# Patient Record
Sex: Female | Born: 1935 | Hispanic: Refuse to answer | Marital: Single
Health system: Midwestern US, Community
[De-identification: ages and names within clinical notes are randomized; demographics above are authoritative.]

## PROBLEM LIST (undated history)

## (undated) DIAGNOSIS — I350 Nonrheumatic aortic (valve) stenosis: Secondary | ICD-10-CM

## (undated) DIAGNOSIS — K5790 Diverticulosis of intestine, part unspecified, without perforation or abscess without bleeding: Secondary | ICD-10-CM

## (undated) DIAGNOSIS — I10 Essential (primary) hypertension: Secondary | ICD-10-CM

## (undated) DIAGNOSIS — K219 Gastro-esophageal reflux disease without esophagitis: Secondary | ICD-10-CM

## (undated) DIAGNOSIS — Z860100 Personal history of colon polyps, unspecified: Secondary | ICD-10-CM

## (undated) DIAGNOSIS — D649 Anemia, unspecified: Secondary | ICD-10-CM

## (undated) DIAGNOSIS — M1990 Unspecified osteoarthritis, unspecified site: Secondary | ICD-10-CM

## (undated) DIAGNOSIS — E785 Hyperlipidemia, unspecified: Secondary | ICD-10-CM

## (undated) HISTORY — DX: Personal history of colon polyps, unspecified: Z86.0100

## (undated) HISTORY — DX: Personal history of colonic polyps: Z86.010

## (undated) HISTORY — DX: Essential (primary) hypertension: I10

## (undated) HISTORY — DX: Nonrheumatic aortic (valve) stenosis: I35.0

## (undated) HISTORY — DX: Anemia, unspecified: D64.9

## (undated) HISTORY — DX: Diverticulosis of intestine, part unspecified, without perforation or abscess without bleeding: K57.90

## (undated) HISTORY — DX: Gastro-esophageal reflux disease without esophagitis: K21.9

## (undated) HISTORY — DX: Unspecified osteoarthritis, unspecified site: M19.90

## (undated) HISTORY — DX: Hyperlipidemia, unspecified: E78.5

---

## 1995-05-07 HISTORY — PX: ABDOMINAL HYSTERECTOMY: SHX81

## 1997-05-06 HISTORY — PX: SMALL INTESTINE SURGERY: SHX150

## 1999-01-02 ENCOUNTER — Encounter: Admission: RE | Admit: 1999-01-02 | Discharge: 1999-04-02 | Payer: Self-pay | Admitting: Orthopedic Surgery

## 1999-04-02 ENCOUNTER — Encounter: Admission: RE | Admit: 1999-04-02 | Discharge: 1999-07-01 | Payer: Self-pay | Admitting: Orthopedic Surgery

## 2000-01-23 ENCOUNTER — Encounter: Admission: RE | Admit: 2000-01-23 | Discharge: 2000-04-22 | Payer: Self-pay

## 2000-06-04 ENCOUNTER — Encounter: Admission: RE | Admit: 2000-06-04 | Discharge: 2000-09-02 | Payer: Self-pay | Admitting: Internal Medicine

## 2000-09-10 ENCOUNTER — Encounter: Admission: RE | Admit: 2000-09-10 | Discharge: 2000-12-09 | Payer: Self-pay | Admitting: Internal Medicine

## 2001-01-07 ENCOUNTER — Encounter: Admission: RE | Admit: 2001-01-07 | Discharge: 2001-02-09 | Payer: Self-pay | Admitting: Internal Medicine

## 2001-05-13 ENCOUNTER — Encounter: Admission: RE | Admit: 2001-05-13 | Discharge: 2001-06-08 | Payer: Self-pay | Admitting: Internal Medicine

## 2001-08-31 ENCOUNTER — Encounter (HOSPITAL_BASED_OUTPATIENT_CLINIC_OR_DEPARTMENT_OTHER): Admission: RE | Admit: 2001-08-31 | Discharge: 2001-11-29 | Payer: Self-pay | Admitting: Internal Medicine

## 2001-12-28 ENCOUNTER — Encounter (HOSPITAL_BASED_OUTPATIENT_CLINIC_OR_DEPARTMENT_OTHER): Admission: RE | Admit: 2001-12-28 | Discharge: 2002-03-28 | Payer: Self-pay | Admitting: Internal Medicine

## 2002-03-25 ENCOUNTER — Encounter: Payer: Self-pay | Admitting: Cardiology

## 2002-03-25 ENCOUNTER — Ambulatory Visit (HOSPITAL_COMMUNITY): Admission: RE | Admit: 2002-03-25 | Discharge: 2002-03-25 | Payer: Self-pay | Admitting: Cardiology

## 2002-03-31 ENCOUNTER — Encounter (HOSPITAL_BASED_OUTPATIENT_CLINIC_OR_DEPARTMENT_OTHER): Admission: RE | Admit: 2002-03-31 | Discharge: 2002-06-30 | Payer: Self-pay | Admitting: Internal Medicine

## 2002-07-19 ENCOUNTER — Encounter (HOSPITAL_BASED_OUTPATIENT_CLINIC_OR_DEPARTMENT_OTHER): Admission: RE | Admit: 2002-07-19 | Discharge: 2002-10-17 | Payer: Self-pay | Admitting: Internal Medicine

## 2002-10-18 ENCOUNTER — Encounter (HOSPITAL_BASED_OUTPATIENT_CLINIC_OR_DEPARTMENT_OTHER): Admission: RE | Admit: 2002-10-18 | Discharge: 2003-01-16 | Payer: Self-pay | Admitting: Internal Medicine

## 2003-02-11 ENCOUNTER — Encounter (HOSPITAL_BASED_OUTPATIENT_CLINIC_OR_DEPARTMENT_OTHER): Admission: RE | Admit: 2003-02-11 | Discharge: 2003-05-12 | Payer: Self-pay | Admitting: Internal Medicine

## 2003-05-19 ENCOUNTER — Encounter (HOSPITAL_BASED_OUTPATIENT_CLINIC_OR_DEPARTMENT_OTHER): Admission: RE | Admit: 2003-05-19 | Discharge: 2003-08-17 | Payer: Self-pay | Admitting: Internal Medicine

## 2003-08-24 ENCOUNTER — Encounter (HOSPITAL_BASED_OUTPATIENT_CLINIC_OR_DEPARTMENT_OTHER): Admission: RE | Admit: 2003-08-24 | Discharge: 2003-09-02 | Payer: Self-pay | Admitting: Internal Medicine

## 2003-09-14 ENCOUNTER — Ambulatory Visit (HOSPITAL_COMMUNITY): Admission: RE | Admit: 2003-09-14 | Discharge: 2003-09-14 | Payer: Self-pay | Admitting: Gastroenterology

## 2003-09-20 ENCOUNTER — Ambulatory Visit (HOSPITAL_COMMUNITY): Admission: RE | Admit: 2003-09-20 | Discharge: 2003-09-20 | Payer: Self-pay | Admitting: Gastroenterology

## 2003-12-21 ENCOUNTER — Encounter (HOSPITAL_BASED_OUTPATIENT_CLINIC_OR_DEPARTMENT_OTHER): Admission: RE | Admit: 2003-12-21 | Discharge: 2004-03-19 | Payer: Self-pay | Admitting: Internal Medicine

## 2004-03-16 ENCOUNTER — Ambulatory Visit: Payer: Self-pay | Admitting: Oncology

## 2004-03-20 ENCOUNTER — Encounter (HOSPITAL_BASED_OUTPATIENT_CLINIC_OR_DEPARTMENT_OTHER): Admission: RE | Admit: 2004-03-20 | Discharge: 2004-04-09 | Payer: Self-pay | Admitting: Internal Medicine

## 2004-05-11 ENCOUNTER — Ambulatory Visit: Payer: Self-pay | Admitting: Oncology

## 2004-06-18 ENCOUNTER — Encounter (HOSPITAL_BASED_OUTPATIENT_CLINIC_OR_DEPARTMENT_OTHER): Admission: RE | Admit: 2004-06-18 | Discharge: 2004-07-27 | Payer: Self-pay | Admitting: Internal Medicine

## 2004-06-28 ENCOUNTER — Ambulatory Visit: Payer: Self-pay | Admitting: Oncology

## 2004-08-20 ENCOUNTER — Encounter (HOSPITAL_BASED_OUTPATIENT_CLINIC_OR_DEPARTMENT_OTHER): Admission: RE | Admit: 2004-08-20 | Discharge: 2004-11-18 | Payer: Self-pay | Admitting: Surgery

## 2004-08-23 ENCOUNTER — Ambulatory Visit: Payer: Self-pay | Admitting: Oncology

## 2004-10-18 ENCOUNTER — Ambulatory Visit: Payer: Self-pay | Admitting: Oncology

## 2004-11-19 ENCOUNTER — Encounter (HOSPITAL_BASED_OUTPATIENT_CLINIC_OR_DEPARTMENT_OTHER): Admission: RE | Admit: 2004-11-19 | Discharge: 2005-02-17 | Payer: Self-pay | Admitting: Surgery

## 2004-12-13 ENCOUNTER — Ambulatory Visit: Payer: Self-pay | Admitting: Oncology

## 2005-01-28 ENCOUNTER — Ambulatory Visit: Payer: Self-pay | Admitting: Oncology

## 2005-05-27 ENCOUNTER — Ambulatory Visit: Payer: Self-pay | Admitting: Oncology

## 2005-07-25 ENCOUNTER — Ambulatory Visit: Payer: Self-pay | Admitting: Oncology

## 2005-08-23 LAB — CBC WITH DIFFERENTIAL/PLATELET
Eosinophils Absolute: 0.2 10*3/uL (ref 0.0–0.5)
MCV: 74.3 fL — ABNORMAL LOW (ref 81.0–101.0)
MONO%: 9.6 % (ref 0.0–13.0)
NEUT#: 4.7 10*3/uL (ref 1.5–6.5)
RBC: 5.77 10*6/uL — ABNORMAL HIGH (ref 3.70–5.32)
RDW: 15.1 % — ABNORMAL HIGH (ref 11.3–14.5)
WBC: 8.4 10*3/uL (ref 3.9–10.0)
lymph#: 2.6 10*3/uL (ref 0.9–3.3)

## 2005-09-18 ENCOUNTER — Ambulatory Visit: Payer: Self-pay | Admitting: Oncology

## 2005-10-18 LAB — CBC WITH DIFFERENTIAL/PLATELET
BASO%: 0.2 % (ref 0.0–2.0)
Basophils Absolute: 0 10*3/uL (ref 0.0–0.1)
EOS%: 4.3 % (ref 0.0–7.0)
HGB: 10.7 g/dL — ABNORMAL LOW (ref 11.6–15.9)
MCH: 23.1 pg — ABNORMAL LOW (ref 26.0–34.0)
MCHC: 31 g/dL — ABNORMAL LOW (ref 32.0–36.0)
MCV: 74.4 fL — ABNORMAL LOW (ref 81.0–101.0)
MONO%: 9.7 % (ref 0.0–13.0)
RBC: 4.63 10*6/uL (ref 3.70–5.32)
RDW: 18.5 % — ABNORMAL HIGH (ref 11.3–14.5)
lymph#: 2.8 10*3/uL (ref 0.9–3.3)

## 2005-10-29 ENCOUNTER — Ambulatory Visit: Payer: Self-pay | Admitting: Oncology

## 2005-11-01 LAB — CBC WITH DIFFERENTIAL/PLATELET
EOS%: 23.7 % — ABNORMAL HIGH (ref 0.0–7.0)
Eosinophils Absolute: 2.4 10*3/uL — ABNORMAL HIGH (ref 0.0–0.5)
MCV: 77.4 fL — ABNORMAL LOW (ref 81.0–101.0)
MONO%: 7.1 % (ref 0.0–13.0)
NEUT#: 4.2 10*3/uL (ref 1.5–6.5)
RBC: 4.87 10*6/uL (ref 3.70–5.32)
RDW: 19.2 % — ABNORMAL HIGH (ref 11.3–14.5)
lymph#: 2.5 10*3/uL (ref 0.9–3.3)

## 2005-12-03 LAB — CBC WITH DIFFERENTIAL/PLATELET
BASO%: 0.6 % (ref 0.0–2.0)
Basophils Absolute: 0.1 10*3/uL (ref 0.0–0.1)
EOS%: 2.9 % (ref 0.0–7.0)
HCT: 37.7 % (ref 34.8–46.6)
HGB: 11.8 g/dL (ref 11.6–15.9)
LYMPH%: 35.1 % (ref 14.0–48.0)
MCH: 24 pg — ABNORMAL LOW (ref 26.0–34.0)
MCHC: 31.2 g/dL — ABNORMAL LOW (ref 32.0–36.0)
MCV: 76.9 fL — ABNORMAL LOW (ref 81.0–101.0)
MONO%: 9.1 % (ref 0.0–13.0)
NEUT%: 52.3 % (ref 39.6–76.8)
Platelets: 254 10*3/uL (ref 145–400)

## 2005-12-12 ENCOUNTER — Ambulatory Visit: Payer: Self-pay | Admitting: Oncology

## 2005-12-13 LAB — CBC WITH DIFFERENTIAL/PLATELET
Basophils Absolute: 0 10*3/uL (ref 0.0–0.1)
Eosinophils Absolute: 0.2 10*3/uL (ref 0.0–0.5)
HCT: 35.1 % (ref 34.8–46.6)
HGB: 11 g/dL — ABNORMAL LOW (ref 11.6–15.9)
MCV: 77 fL — ABNORMAL LOW (ref 81.0–101.0)
MONO%: 7.6 % (ref 0.0–13.0)
NEUT#: 5.1 10*3/uL (ref 1.5–6.5)
NEUT%: 59.9 % (ref 39.6–76.8)
Platelets: 256 10*3/uL (ref 145–400)
RDW: 14.9 % — ABNORMAL HIGH (ref 11.3–14.5)

## 2005-12-31 LAB — CBC WITH DIFFERENTIAL/PLATELET
Basophils Absolute: 0 10*3/uL (ref 0.0–0.1)
Eosinophils Absolute: 0.2 10*3/uL (ref 0.0–0.5)
LYMPH%: 31.9 % (ref 14.0–48.0)
MCH: 23.8 pg — ABNORMAL LOW (ref 26.0–34.0)
MCV: 76.8 fL — ABNORMAL LOW (ref 81.0–101.0)
MONO%: 9.2 % (ref 0.0–13.0)
NEUT#: 4.3 10*3/uL (ref 1.5–6.5)
Platelets: 283 10*3/uL (ref 145–400)
RBC: 5.23 10*6/uL (ref 3.70–5.32)

## 2006-01-14 LAB — CBC WITH DIFFERENTIAL/PLATELET
BASO%: 1.5 % (ref 0.0–2.0)
Basophils Absolute: 0.1 10*3/uL (ref 0.0–0.1)
EOS%: 1.8 % (ref 0.0–7.0)
HCT: 40.9 % (ref 34.8–46.6)
HGB: 12.1 g/dL (ref 11.6–15.9)
LYMPH%: 31.7 % (ref 14.0–48.0)
MCH: 22.6 pg — ABNORMAL LOW (ref 26.0–34.0)
MCHC: 29.7 g/dL — ABNORMAL LOW (ref 32.0–36.0)
MCV: 76.2 fL — ABNORMAL LOW (ref 81.0–101.0)
NEUT%: 58.8 % (ref 39.6–76.8)
Platelets: 268 10*3/uL (ref 145–400)

## 2006-01-14 LAB — COMPREHENSIVE METABOLIC PANEL
ALT: 8 U/L (ref 0–40)
AST: 12 U/L (ref 0–37)
BUN: 12 mg/dL (ref 6–23)
Calcium: 10 mg/dL (ref 8.4–10.5)
Creatinine, Ser: 0.78 mg/dL (ref 0.40–1.20)
Total Bilirubin: 0.4 mg/dL (ref 0.3–1.2)

## 2006-01-24 ENCOUNTER — Ambulatory Visit: Payer: Self-pay | Admitting: Oncology

## 2006-01-28 LAB — CBC WITH DIFFERENTIAL/PLATELET
BASO%: 1.2 % (ref 0.0–2.0)
Basophils Absolute: 0.1 10*3/uL (ref 0.0–0.1)
HCT: 35.3 % (ref 34.8–46.6)
HGB: 11 g/dL — ABNORMAL LOW (ref 11.6–15.9)
LYMPH%: 30.2 % (ref 14.0–48.0)
MCHC: 31.2 g/dL — ABNORMAL LOW (ref 32.0–36.0)
MONO#: 0.8 10*3/uL (ref 0.1–0.9)
NEUT%: 59.1 % (ref 39.6–76.8)
Platelets: 230 10*3/uL (ref 145–400)
WBC: 10.5 10*3/uL — ABNORMAL HIGH (ref 3.9–10.0)
lymph#: 3.2 10*3/uL (ref 0.9–3.3)

## 2006-03-11 ENCOUNTER — Ambulatory Visit: Payer: Self-pay | Admitting: Family Medicine

## 2006-03-18 ENCOUNTER — Encounter: Admission: RE | Admit: 2006-03-18 | Discharge: 2006-03-18 | Payer: Self-pay | Admitting: Oncology

## 2006-03-19 ENCOUNTER — Ambulatory Visit: Payer: Self-pay | Admitting: Oncology

## 2006-03-21 LAB — CBC WITH DIFFERENTIAL/PLATELET
Basophils Absolute: 0.1 10*3/uL (ref 0.0–0.1)
Eosinophils Absolute: 0.1 10*3/uL (ref 0.0–0.5)
HCT: 37.6 % (ref 34.8–46.6)
HGB: 11.2 g/dL — ABNORMAL LOW (ref 11.6–15.9)
MONO#: 0.9 10*3/uL (ref 0.1–0.9)
NEUT#: 5.8 10*3/uL (ref 1.5–6.5)
NEUT%: 60.2 % (ref 39.6–76.8)
RDW: 14.2 % (ref 11.3–14.5)
lymph#: 2.7 10*3/uL (ref 0.9–3.3)

## 2006-04-18 LAB — CBC WITH DIFFERENTIAL/PLATELET
BASO%: 1.4 % (ref 0.0–2.0)
EOS%: 1 % (ref 0.0–7.0)
HCT: 34.7 % — ABNORMAL LOW (ref 34.8–46.6)
HGB: 10.6 g/dL — ABNORMAL LOW (ref 11.6–15.9)
MCH: 23 pg — ABNORMAL LOW (ref 26.0–34.0)
MCHC: 30.4 g/dL — ABNORMAL LOW (ref 32.0–36.0)
MONO#: 0.9 10*3/uL (ref 0.1–0.9)
RDW: 13.9 % (ref 11.3–14.5)
WBC: 8.9 10*3/uL (ref 3.9–10.0)
lymph#: 2.9 10*3/uL (ref 0.9–3.3)

## 2006-05-13 ENCOUNTER — Ambulatory Visit: Payer: Self-pay | Admitting: Oncology

## 2006-05-16 LAB — CBC WITH DIFFERENTIAL/PLATELET
Basophils Absolute: 0.1 10*3/uL (ref 0.0–0.1)
Eosinophils Absolute: 0.1 10*3/uL (ref 0.0–0.5)
HGB: 12.4 g/dL (ref 11.6–15.9)
MONO#: 0.7 10*3/uL (ref 0.1–0.9)
NEUT#: 5 10*3/uL (ref 1.5–6.5)
Platelets: 260 10*3/uL (ref 145–400)
RBC: 5.42 10*6/uL — ABNORMAL HIGH (ref 3.70–5.32)
RDW: 13.9 % (ref 11.3–14.5)
WBC: 8.7 10*3/uL (ref 3.9–10.0)

## 2006-06-13 LAB — CBC WITH DIFFERENTIAL/PLATELET
Basophils Absolute: 0.1 10*3/uL (ref 0.0–0.1)
Eosinophils Absolute: 0.2 10*3/uL (ref 0.0–0.5)
HCT: 37.6 % (ref 34.8–46.6)
LYMPH%: 32.7 % (ref 14.0–48.0)
MONO#: 0.7 10*3/uL (ref 0.1–0.9)
NEUT#: 5.9 10*3/uL (ref 1.5–6.5)
NEUT%: 57.5 % (ref 39.6–76.8)
Platelets: 251 10*3/uL (ref 145–400)
WBC: 10.2 10*3/uL — ABNORMAL HIGH (ref 3.9–10.0)

## 2006-07-08 ENCOUNTER — Ambulatory Visit: Payer: Self-pay | Admitting: Oncology

## 2006-07-10 ENCOUNTER — Ambulatory Visit: Payer: Self-pay | Admitting: Family Medicine

## 2006-07-11 LAB — CBC WITH DIFFERENTIAL/PLATELET
BASO%: 0.8 % (ref 0.0–2.0)
EOS%: 1.8 % (ref 0.0–7.0)
HCT: 32.5 % — ABNORMAL LOW (ref 34.8–46.6)
LYMPH%: 29.2 % (ref 14.0–48.0)
MCH: 23.7 pg — ABNORMAL LOW (ref 26.0–34.0)
MCHC: 31.9 g/dL — ABNORMAL LOW (ref 32.0–36.0)
MCV: 74.1 fL — ABNORMAL LOW (ref 81.0–101.0)
MONO#: 0.9 10*3/uL (ref 0.1–0.9)
NEUT%: 59.1 % (ref 39.6–76.8)
Platelets: 249 10*3/uL (ref 145–400)

## 2006-08-08 LAB — CBC WITH DIFFERENTIAL/PLATELET
BASO%: 0.2 % (ref 0.0–2.0)
EOS%: 2.4 % (ref 0.0–7.0)
HCT: 35.1 % (ref 34.8–46.6)
MCH: 23.4 pg — ABNORMAL LOW (ref 26.0–34.0)
MCHC: 31.4 g/dL — ABNORMAL LOW (ref 32.0–36.0)
NEUT%: 63.5 % (ref 39.6–76.8)
lymph#: 2.7 10*3/uL (ref 0.9–3.3)

## 2006-09-02 ENCOUNTER — Ambulatory Visit: Payer: Self-pay | Admitting: Oncology

## 2006-10-03 LAB — CBC WITH DIFFERENTIAL/PLATELET
BASO%: 0.1 % (ref 0.0–2.0)
HCT: 36.3 % (ref 34.8–46.6)
LYMPH%: 35 % (ref 14.0–48.0)
MCH: 22.9 pg — ABNORMAL LOW (ref 26.0–34.0)
MCHC: 31.6 g/dL — ABNORMAL LOW (ref 32.0–36.0)
MCV: 72.6 fL — ABNORMAL LOW (ref 81.0–101.0)
MONO#: 0.6 10*3/uL (ref 0.1–0.9)
MONO%: 6.2 % (ref 0.0–13.0)
NEUT%: 56.4 % (ref 39.6–76.8)
Platelets: 256 10*3/uL (ref 145–400)
RBC: 4.99 10*6/uL (ref 3.70–5.32)
WBC: 9.4 10*3/uL (ref 3.9–10.0)

## 2006-10-28 ENCOUNTER — Ambulatory Visit: Payer: Self-pay | Admitting: Oncology

## 2006-10-31 LAB — CBC WITH DIFFERENTIAL/PLATELET
BASO%: 1.4 % (ref 0.0–2.0)
EOS%: 1.1 % (ref 0.0–7.0)
HCT: 39.7 % (ref 34.8–46.6)
LYMPH%: 25.7 % (ref 14.0–48.0)
MCH: 21.9 pg — ABNORMAL LOW (ref 26.0–34.0)
MCHC: 29.7 g/dL — ABNORMAL LOW (ref 32.0–36.0)
NEUT%: 65.3 % (ref 39.6–76.8)
RBC: 5.39 10*6/uL — ABNORMAL HIGH (ref 3.70–5.32)
lymph#: 2.5 10*3/uL (ref 0.9–3.3)

## 2006-11-10 ENCOUNTER — Ambulatory Visit: Payer: Self-pay | Admitting: Family Medicine

## 2006-11-14 ENCOUNTER — Ambulatory Visit: Payer: Self-pay | Admitting: Family Medicine

## 2006-11-19 ENCOUNTER — Encounter: Payer: Self-pay | Admitting: Family Medicine

## 2006-11-19 ENCOUNTER — Ambulatory Visit (HOSPITAL_COMMUNITY): Admission: RE | Admit: 2006-11-19 | Discharge: 2006-11-19 | Payer: Self-pay | Admitting: Family Medicine

## 2006-11-19 ENCOUNTER — Ambulatory Visit: Payer: Self-pay | Admitting: Vascular Surgery

## 2006-11-28 LAB — CBC WITH DIFFERENTIAL/PLATELET
BASO%: 0.6 % (ref 0.0–2.0)
EOS%: 4.2 % (ref 0.0–7.0)
MCH: 23.2 pg — ABNORMAL LOW (ref 26.0–34.0)
MCHC: 31.5 g/dL — ABNORMAL LOW (ref 32.0–36.0)
RDW: 17.9 % — ABNORMAL HIGH (ref 11.3–14.5)
WBC: 10.3 10*3/uL — ABNORMAL HIGH (ref 3.9–10.0)
lymph#: 2.6 10*3/uL (ref 0.9–3.3)

## 2006-12-24 ENCOUNTER — Ambulatory Visit: Payer: Self-pay | Admitting: Oncology

## 2006-12-26 LAB — CBC WITH DIFFERENTIAL/PLATELET
Basophils Absolute: 0.1 10*3/uL (ref 0.0–0.1)
EOS%: 5.2 % (ref 0.0–7.0)
Eosinophils Absolute: 0.5 10*3/uL (ref 0.0–0.5)
HGB: 12 g/dL (ref 11.6–15.9)
MONO#: 0.7 10*3/uL (ref 0.1–0.9)
NEUT#: 5.7 10*3/uL (ref 1.5–6.5)
RDW: 14.3 % (ref 11.3–14.5)
WBC: 9.5 10*3/uL (ref 3.9–10.0)
lymph#: 2.5 10*3/uL (ref 0.9–3.3)

## 2007-01-23 LAB — CBC WITH DIFFERENTIAL/PLATELET
Basophils Absolute: 0 10*3/uL (ref 0.0–0.1)
Eosinophils Absolute: 0.2 10*3/uL (ref 0.0–0.5)
HGB: 10.6 g/dL — ABNORMAL LOW (ref 11.6–15.9)
MCV: 74.3 fL — ABNORMAL LOW (ref 81.0–101.0)
MONO%: 9.3 % (ref 0.0–13.0)
NEUT#: 5.4 10*3/uL (ref 1.5–6.5)
Platelets: 243 10*3/uL (ref 145–400)
RDW: 16.5 % — ABNORMAL HIGH (ref 11.3–14.5)

## 2007-02-16 ENCOUNTER — Ambulatory Visit: Payer: Self-pay | Admitting: Family Medicine

## 2007-02-17 ENCOUNTER — Ambulatory Visit: Payer: Self-pay | Admitting: Oncology

## 2007-02-19 LAB — CBC WITH DIFFERENTIAL/PLATELET
Basophils Absolute: 0 10*3/uL (ref 0.0–0.1)
Eosinophils Absolute: 0.5 10*3/uL (ref 0.0–0.5)
LYMPH%: 21.7 % (ref 14.0–48.0)
MCV: 74.6 fL — ABNORMAL LOW (ref 81.0–101.0)
MONO%: 6.8 % (ref 0.0–13.0)
NEUT#: 5.2 10*3/uL (ref 1.5–6.5)
NEUT%: 64.4 % (ref 39.6–76.8)
Platelets: 226 10*3/uL (ref 145–400)
RBC: 4.88 10*6/uL (ref 3.70–5.32)

## 2007-03-24 ENCOUNTER — Encounter: Admission: RE | Admit: 2007-03-24 | Discharge: 2007-03-24 | Payer: Self-pay | Admitting: Family Medicine

## 2007-03-27 LAB — CBC WITH DIFFERENTIAL/PLATELET
BASO%: 0.4 % (ref 0.0–2.0)
Basophils Absolute: 0 10*3/uL (ref 0.0–0.1)
EOS%: 2.5 % (ref 0.0–7.0)
HCT: 33 % — ABNORMAL LOW (ref 34.8–46.6)
HGB: 10.1 g/dL — ABNORMAL LOW (ref 11.6–15.9)
LYMPH%: 22.9 % (ref 14.0–48.0)
MCH: 23 pg — ABNORMAL LOW (ref 26.0–34.0)
MCHC: 30.6 g/dL — ABNORMAL LOW (ref 32.0–36.0)
MONO#: 0.8 10*3/uL (ref 0.1–0.9)
NEUT%: 64.6 % (ref 39.6–76.8)
Platelets: 237 10*3/uL (ref 145–400)

## 2007-05-20 ENCOUNTER — Ambulatory Visit: Payer: Self-pay | Admitting: Oncology

## 2007-05-22 LAB — CBC WITH DIFFERENTIAL/PLATELET
BASO%: 0 % (ref 0.0–2.0)
Basophils Absolute: 0 10*3/uL (ref 0.0–0.1)
EOS%: 2.2 % (ref 0.0–7.0)
HCT: 33.5 % — ABNORMAL LOW (ref 34.8–46.6)
MCH: 23.8 pg — ABNORMAL LOW (ref 26.0–34.0)
MCHC: 31.8 g/dL — ABNORMAL LOW (ref 32.0–36.0)
MCV: 74.6 fL — ABNORMAL LOW (ref 81.0–101.0)
MONO%: 11 % (ref 0.0–13.0)
NEUT%: 66.8 % (ref 39.6–76.8)
lymph#: 2.1 10*3/uL (ref 0.9–3.3)

## 2007-07-20 ENCOUNTER — Ambulatory Visit: Payer: Self-pay | Admitting: Oncology

## 2007-07-22 LAB — CBC WITH DIFFERENTIAL/PLATELET
BASO%: 1 % (ref 0.0–2.0)
EOS%: 0.7 % (ref 0.0–7.0)
HCT: 32.8 % — ABNORMAL LOW (ref 34.8–46.6)
LYMPH%: 20.6 % (ref 14.0–48.0)
MCH: 23.4 pg — ABNORMAL LOW (ref 26.0–34.0)
MCHC: 31.7 g/dL — ABNORMAL LOW (ref 32.0–36.0)
MONO#: 0.6 10*3/uL (ref 0.1–0.9)
NEUT%: 71.5 % (ref 39.6–76.8)
RBC: 4.44 10*6/uL (ref 3.70–5.32)
WBC: 9.3 10*3/uL (ref 3.9–10.0)
lymph#: 1.9 10*3/uL (ref 0.9–3.3)

## 2007-08-26 ENCOUNTER — Ambulatory Visit: Payer: Self-pay | Admitting: Family Medicine

## 2007-09-09 ENCOUNTER — Ambulatory Visit: Payer: Self-pay | Admitting: Oncology

## 2007-09-11 LAB — CBC WITH DIFFERENTIAL/PLATELET
BASO%: 0 % (ref 0.0–2.0)
Basophils Absolute: 0 10*3/uL (ref 0.0–0.1)
EOS%: 0.8 % (ref 0.0–7.0)
HCT: 33.8 % — ABNORMAL LOW (ref 34.8–46.6)
HGB: 10.6 g/dL — ABNORMAL LOW (ref 11.6–15.9)
NEUT%: 65.7 % (ref 39.6–76.8)
Platelets: 218 10*3/uL (ref 145–400)

## 2007-09-29 ENCOUNTER — Ambulatory Visit: Payer: Self-pay | Admitting: Family Medicine

## 2007-09-29 ENCOUNTER — Encounter: Admission: RE | Admit: 2007-09-29 | Discharge: 2007-09-29 | Payer: Self-pay | Admitting: Family Medicine

## 2007-10-22 ENCOUNTER — Encounter: Admission: RE | Admit: 2007-10-22 | Discharge: 2007-11-23 | Payer: Self-pay | Admitting: Orthopaedic Surgery

## 2007-11-04 ENCOUNTER — Ambulatory Visit: Payer: Self-pay | Admitting: Oncology

## 2007-11-09 LAB — CBC WITH DIFFERENTIAL/PLATELET
BASO%: 1 % (ref 0.0–2.0)
Basophils Absolute: 0.1 10*3/uL (ref 0.0–0.1)
EOS%: 1 % (ref 0.0–7.0)
Eosinophils Absolute: 0.1 10*3/uL (ref 0.0–0.5)
HGB: 9.9 g/dL — ABNORMAL LOW (ref 11.6–15.9)
MCHC: 31.7 g/dL — ABNORMAL LOW (ref 32.0–36.0)
MCV: 74.6 fL — ABNORMAL LOW (ref 81.0–101.0)
NEUT%: 63.1 % (ref 39.6–76.8)
Platelets: 244 10*3/uL (ref 145–400)
RBC: 4.18 10*6/uL (ref 3.70–5.32)
WBC: 8.4 10*3/uL (ref 3.9–10.0)

## 2007-12-31 ENCOUNTER — Ambulatory Visit: Payer: Self-pay | Admitting: Oncology

## 2008-01-22 LAB — CBC WITH DIFFERENTIAL/PLATELET
LYMPH%: 23.2 % (ref 14.0–48.0)
MCHC: 31.2 g/dL — ABNORMAL LOW (ref 32.0–36.0)
MONO#: 0.7 10*3/uL (ref 0.1–0.9)
NEUT%: 67.6 % (ref 39.6–76.8)
RBC: 4.25 10*6/uL (ref 3.70–5.32)
RDW: 16 % — ABNORMAL HIGH (ref 11.3–14.5)

## 2008-02-23 ENCOUNTER — Ambulatory Visit: Payer: Self-pay | Admitting: Family Medicine

## 2008-03-23 ENCOUNTER — Ambulatory Visit: Payer: Self-pay | Admitting: Oncology

## 2008-03-29 ENCOUNTER — Encounter: Admission: RE | Admit: 2008-03-29 | Discharge: 2008-03-29 | Payer: Self-pay | Admitting: Oncology

## 2008-05-25 ENCOUNTER — Ambulatory Visit: Payer: Self-pay | Admitting: Oncology

## 2008-05-27 LAB — CBC WITH DIFFERENTIAL/PLATELET
BASO%: 0.4 % (ref 0.0–2.0)
EOS%: 1 % (ref 0.0–7.0)
HCT: 32.4 % — ABNORMAL LOW (ref 34.8–46.6)
HGB: 10 g/dL — ABNORMAL LOW (ref 11.6–15.9)
MCV: 78.4 fL — ABNORMAL LOW (ref 81.0–101.0)
MONO#: 0.9 10*3/uL (ref 0.1–0.9)
MONO%: 9.3 % (ref 0.0–13.0)
NEUT#: 6.6 10*3/uL — ABNORMAL HIGH (ref 1.5–6.5)
NEUT%: 65 % (ref 39.6–76.8)
RDW: 14.1 % (ref 11.3–14.5)
WBC: 10.2 10*3/uL — ABNORMAL HIGH (ref 3.9–10.0)
lymph#: 2.5 10*3/uL (ref 0.9–3.3)

## 2008-07-20 ENCOUNTER — Ambulatory Visit: Payer: Self-pay | Admitting: Oncology

## 2008-07-22 LAB — CBC WITH DIFFERENTIAL/PLATELET
EOS%: 0.9 % (ref 0.0–7.0)
Eosinophils Absolute: 0.1 10*3/uL (ref 0.0–0.5)
HCT: 34.6 % — ABNORMAL LOW (ref 34.8–46.6)
HGB: 11.1 g/dL — ABNORMAL LOW (ref 11.6–15.9)
LYMPH%: 22.9 % (ref 14.0–49.7)
MCHC: 32 g/dL (ref 31.5–36.0)
MONO#: 0.8 10*3/uL (ref 0.1–0.9)
MONO%: 7.6 % (ref 0.0–14.0)
NEUT#: 7.3 10*3/uL — ABNORMAL HIGH (ref 1.5–6.5)

## 2008-09-21 ENCOUNTER — Ambulatory Visit: Payer: Self-pay | Admitting: Oncology

## 2008-09-23 LAB — CBC WITH DIFFERENTIAL/PLATELET
EOS%: 0.5 % (ref 0.0–7.0)
HCT: 33.4 % — ABNORMAL LOW (ref 34.8–46.6)
MCHC: 31.3 g/dL — ABNORMAL LOW (ref 31.5–36.0)
MONO#: 0.8 10*3/uL (ref 0.1–0.9)
NEUT%: 63.7 % (ref 38.4–76.8)
Platelets: 219 10*3/uL (ref 145–400)
RBC: 4.37 10*6/uL (ref 3.70–5.45)
RDW: 16.1 % — ABNORMAL HIGH (ref 11.2–14.5)
WBC: 9.4 10*3/uL (ref 3.9–10.3)
lymph#: 2.6 10*3/uL (ref 0.9–3.3)

## 2008-10-06 ENCOUNTER — Ambulatory Visit: Payer: Self-pay | Admitting: Family Medicine

## 2008-10-10 ENCOUNTER — Ambulatory Visit: Payer: Self-pay | Admitting: Family Medicine

## 2008-11-23 ENCOUNTER — Ambulatory Visit: Payer: Self-pay | Admitting: Oncology

## 2008-11-25 LAB — CBC WITH DIFFERENTIAL/PLATELET
EOS%: 0.5 % (ref 0.0–7.0)
HGB: 9.8 g/dL — ABNORMAL LOW (ref 11.6–15.9)
MCV: 74.1 fL — ABNORMAL LOW (ref 79.5–101.0)
MONO%: 9.9 % (ref 0.0–14.0)
NEUT#: 6.7 10*3/uL — ABNORMAL HIGH (ref 1.5–6.5)
NEUT%: 61.1 % (ref 38.4–76.8)
Platelets: 197 10*3/uL (ref 145–400)
RBC: 4.21 10*6/uL (ref 3.70–5.45)
RDW: 15.3 % — ABNORMAL HIGH (ref 11.2–14.5)

## 2009-03-28 ENCOUNTER — Ambulatory Visit: Payer: Self-pay | Admitting: Oncology

## 2009-04-03 LAB — CBC WITH DIFFERENTIAL/PLATELET
Basophils Absolute: 0.1 10*3/uL (ref 0.0–0.1)
HCT: 31.4 % — ABNORMAL LOW (ref 34.8–46.6)
LYMPH%: 23 % (ref 14.0–49.7)
MCHC: 31.2 g/dL — ABNORMAL LOW (ref 31.5–36.0)
MCV: 79.7 fL (ref 79.5–101.0)
Platelets: 260 10*3/uL (ref 145–400)
WBC: 9.1 10*3/uL (ref 3.9–10.3)
lymph#: 2.1 10*3/uL (ref 0.9–3.3)

## 2009-04-05 ENCOUNTER — Encounter: Admission: RE | Admit: 2009-04-05 | Discharge: 2009-04-05 | Payer: Self-pay | Admitting: Family Medicine

## 2009-05-19 ENCOUNTER — Ambulatory Visit: Payer: Self-pay | Admitting: Family Medicine

## 2009-05-29 ENCOUNTER — Ambulatory Visit: Payer: Self-pay | Admitting: Oncology

## 2009-05-31 LAB — CBC WITH DIFFERENTIAL/PLATELET
BASO%: 0.4 % (ref 0.0–2.0)
Basophils Absolute: 0.1 10*3/uL (ref 0.0–0.1)
EOS%: 0.9 % (ref 0.0–7.0)
Eosinophils Absolute: 0.1 10*3/uL (ref 0.0–0.5)
HGB: 11.3 g/dL — ABNORMAL LOW (ref 11.6–15.9)
LYMPH%: 32.5 % (ref 14.0–49.7)
MCH: 23.3 pg — ABNORMAL LOW (ref 25.1–34.0)
NEUT#: 6.4 10*3/uL (ref 1.5–6.5)
NEUT%: 56.4 % (ref 38.4–76.8)
RBC: 4.86 10*6/uL (ref 3.70–5.45)
WBC: 11.3 10*3/uL — ABNORMAL HIGH (ref 3.9–10.3)

## 2009-05-31 LAB — TECHNOLOGIST REVIEW

## 2009-07-09 IMAGING — CR DG LUMBAR SPINE COMPLETE 4+V
6 series · 6 of 6 positions shown · non-contrast
Comparison: No priors

CLINICAL DATA: Low back pain

LUMBAR SPINE - COMPLETE 4+ VIEW

[t l-spine a.p. (1 of 2)]
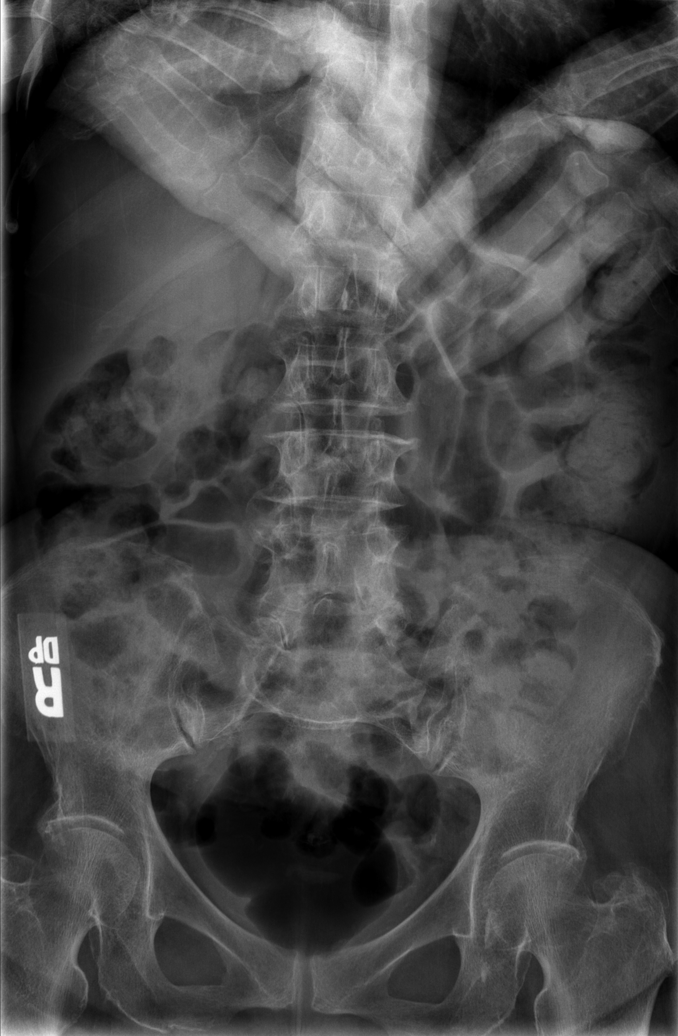

[t l-spine a.p. (2 of 2)]
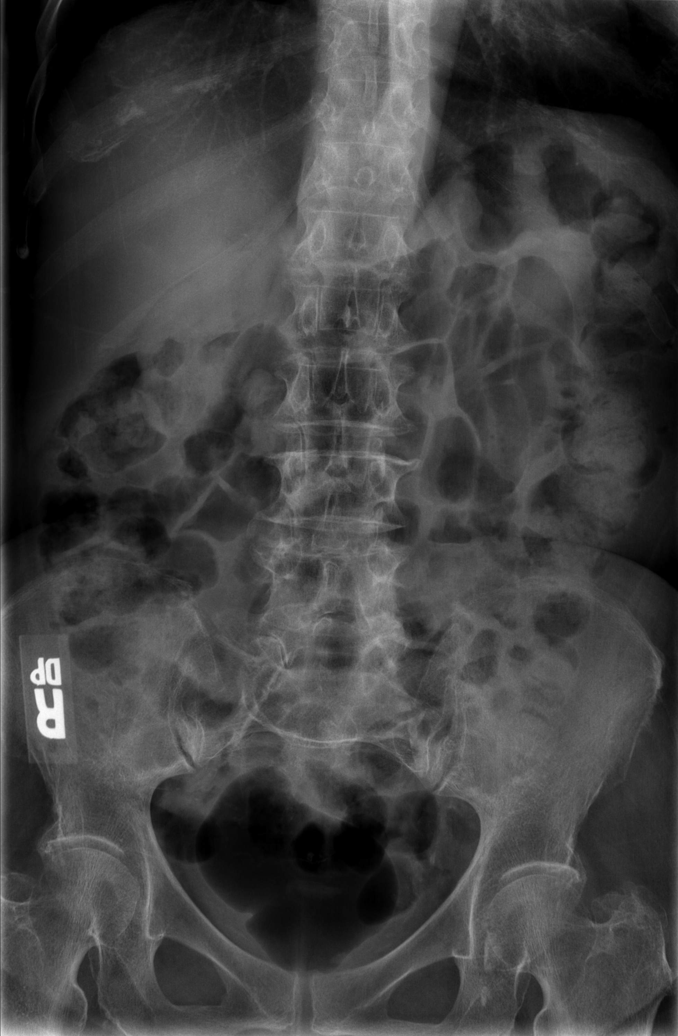

[t l-spine oblique exposure (1 of 2)]
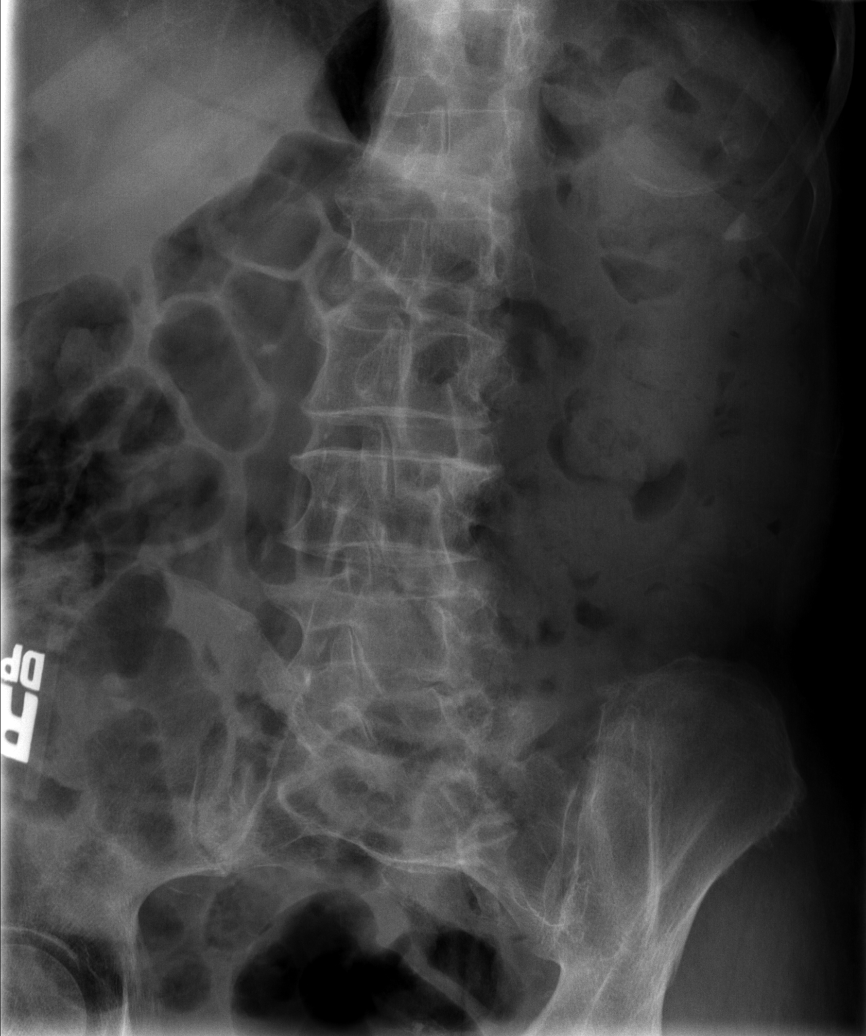

[t l-spine oblique exposure (2 of 2)]
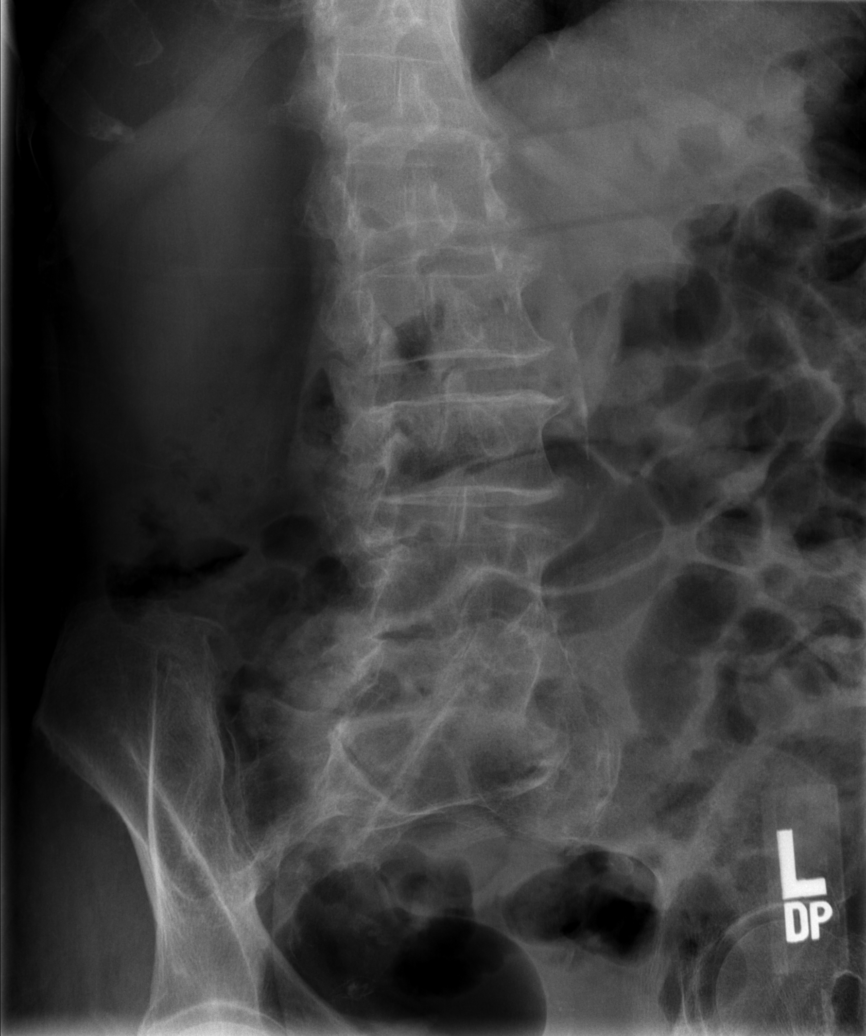

[t l-spine lat]
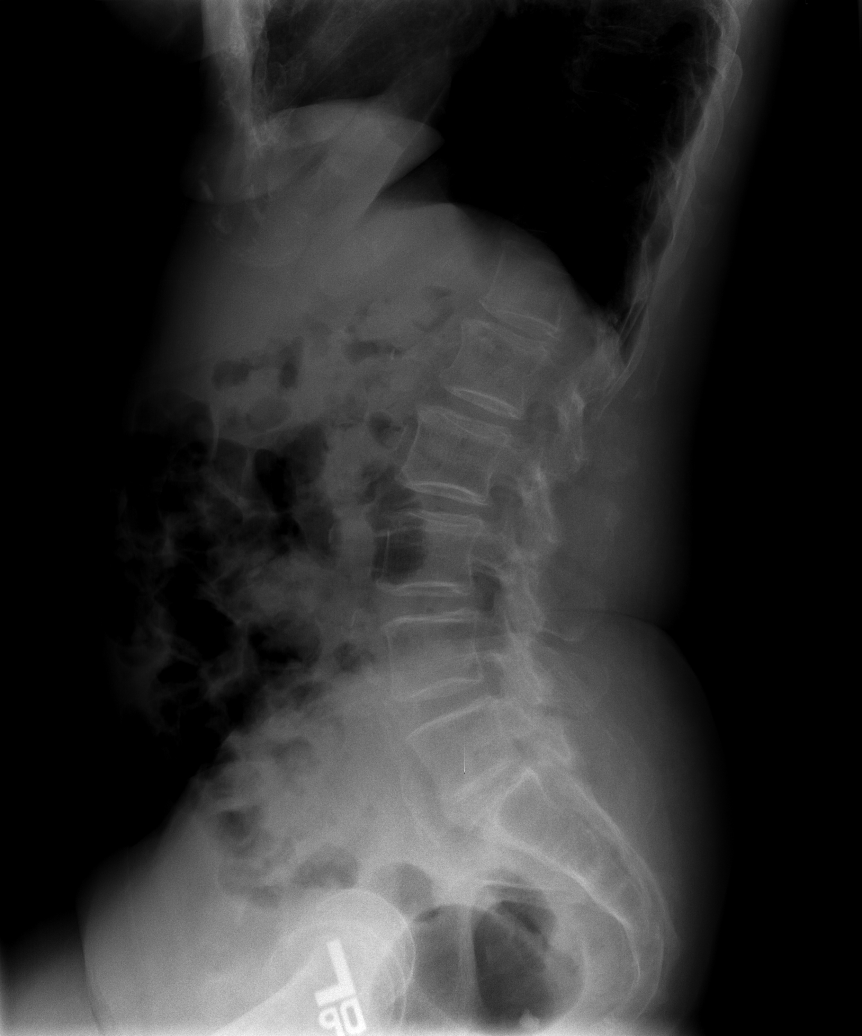

[t l-spine l5-s1 spot]
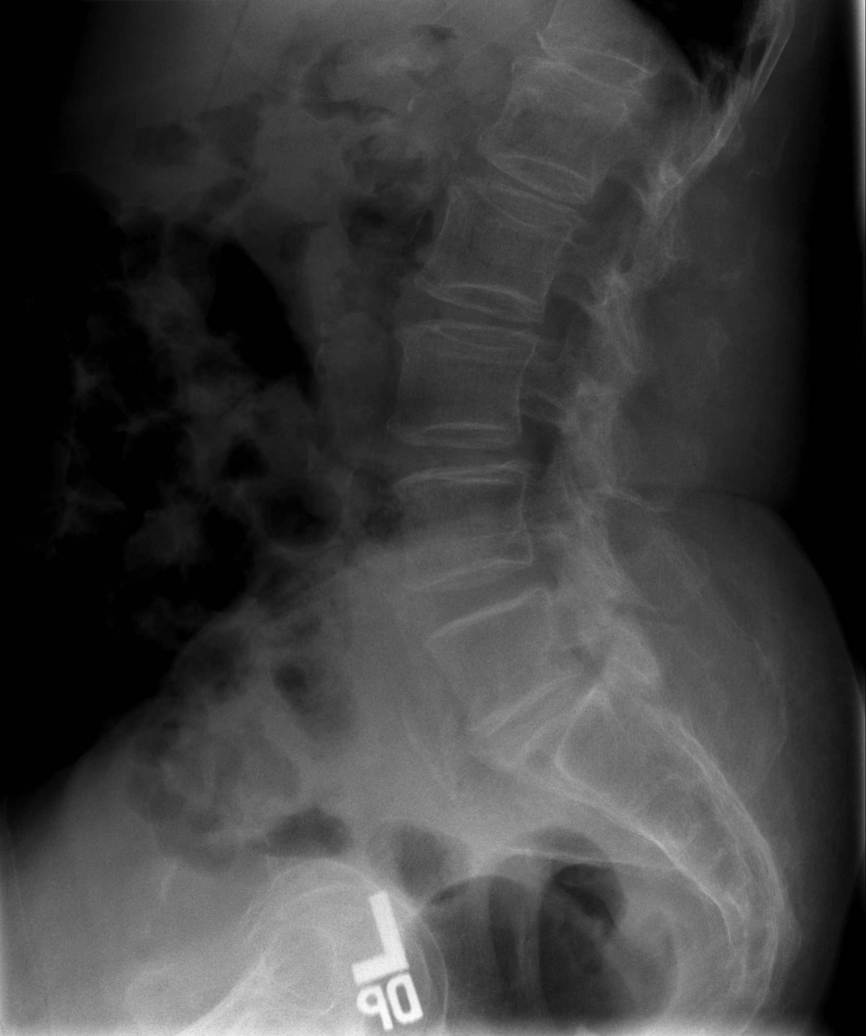

[6 of 6 positions shown; findings below may reference images not displayed]

FINDINGS: The patient either has six lumbar vertebra, or
transitional lumbosacral anatomy.

The bones are mildly demineralized consistent with aging gender.
There are no fractures or acute changes.  There is degenerative
disease disc disease at the lowest disc space.  Soft tissues
unremarkable except for aortoiliac calcification.

There are degenerative changes of the SI joints.
IMPRESSION: 1.  No acute findings.
2.  There are some changes of degenerative disc disease and
degenerative changes of the SI joints.

## 2009-07-20 ENCOUNTER — Ambulatory Visit: Payer: Self-pay | Admitting: Oncology

## 2009-07-24 LAB — CBC WITH DIFFERENTIAL/PLATELET
Basophils Absolute: 0.1 10*3/uL (ref 0.0–0.1)
HGB: 9.7 g/dL — ABNORMAL LOW (ref 11.6–15.9)
MCH: 23.5 pg — ABNORMAL LOW (ref 25.1–34.0)
MCHC: 31 g/dL — ABNORMAL LOW (ref 31.5–36.0)
MONO#: 1.1 10*3/uL — ABNORMAL HIGH (ref 0.1–0.9)
NEUT#: 7.4 10*3/uL — ABNORMAL HIGH (ref 1.5–6.5)
NEUT%: 60.4 % (ref 38.4–76.8)
RBC: 4.12 10*6/uL (ref 3.70–5.45)
WBC: 12.2 10*3/uL — ABNORMAL HIGH (ref 3.9–10.3)
lymph#: 3.6 10*3/uL — ABNORMAL HIGH (ref 0.9–3.3)
nRBC: 0 % (ref 0–0)

## 2009-09-12 ENCOUNTER — Ambulatory Visit: Payer: Self-pay | Admitting: Family Medicine

## 2009-09-18 ENCOUNTER — Ambulatory Visit: Payer: Self-pay | Admitting: Oncology

## 2009-09-18 LAB — CBC WITH DIFFERENTIAL/PLATELET
Eosinophils Absolute: 0.1 10*3/uL (ref 0.0–0.5)
LYMPH%: 22 % (ref 14.0–49.7)
MCH: 24.3 pg — ABNORMAL LOW (ref 25.1–34.0)
MCV: 76.7 fL — ABNORMAL LOW (ref 79.5–101.0)
MONO#: 1 10*3/uL — ABNORMAL HIGH (ref 0.1–0.9)
MONO%: 10.6 % (ref 0.0–14.0)
Platelets: 214 10*3/uL (ref 145–400)
WBC: 9.8 10*3/uL (ref 3.9–10.3)
lymph#: 2.2 10*3/uL (ref 0.9–3.3)

## 2009-09-27 ENCOUNTER — Ambulatory Visit: Payer: Self-pay | Admitting: Family Medicine

## 2009-10-03 ENCOUNTER — Ambulatory Visit: Payer: Self-pay | Admitting: Family Medicine

## 2009-11-13 ENCOUNTER — Ambulatory Visit: Payer: Self-pay | Admitting: Oncology

## 2009-11-13 LAB — CBC WITH DIFFERENTIAL/PLATELET
BASO%: 0.3 % (ref 0.0–2.0)
Basophils Absolute: 0 10*3/uL (ref 0.0–0.1)
Eosinophils Absolute: 0.1 10*3/uL (ref 0.0–0.5)
LYMPH%: 42 % (ref 14.0–49.7)
MCHC: 30.5 g/dL — ABNORMAL LOW (ref 31.5–36.0)
MCV: 74.9 fL — ABNORMAL LOW (ref 79.5–101.0)
MONO#: 0.6 10*3/uL (ref 0.1–0.9)
MONO%: 9.3 % (ref 0.0–14.0)
RBC: 4.15 10*6/uL (ref 3.70–5.45)
RDW: 16 % — ABNORMAL HIGH (ref 11.2–14.5)
WBC: 6.5 10*3/uL (ref 3.9–10.3)

## 2009-11-15 ENCOUNTER — Ambulatory Visit: Payer: Self-pay | Admitting: Family Medicine

## 2010-03-01 ENCOUNTER — Ambulatory Visit: Payer: Self-pay | Admitting: Oncology

## 2010-03-05 LAB — CBC WITH DIFFERENTIAL/PLATELET
BASO%: 1 % (ref 0.0–2.0)
Basophils Absolute: 0.1 10*3/uL (ref 0.0–0.1)
Eosinophils Absolute: 0.1 10*3/uL (ref 0.0–0.5)
LYMPH%: 32.6 % (ref 14.0–49.7)
MONO#: 0.6 10*3/uL (ref 0.1–0.9)
MONO%: 8.7 % (ref 0.0–14.0)
lymph#: 2.2 10*3/uL (ref 0.9–3.3)

## 2010-04-02 ENCOUNTER — Ambulatory Visit: Payer: Self-pay | Admitting: Oncology

## 2010-04-02 LAB — CBC & DIFF AND RETIC
Eosinophils Absolute: 0.1 10*3/uL (ref 0.0–0.5)
LYMPH%: 30.9 % (ref 14.0–49.7)
MCV: 77.3 fL — ABNORMAL LOW (ref 79.5–101.0)
MONO%: 9.7 % (ref 0.0–14.0)
NEUT%: 58.2 % (ref 38.4–76.8)
Platelets: 220 10*3/uL (ref 145–400)
RDW: 14 % (ref 11.2–14.5)
Retic Ct Abs: 60.61 10*3/uL (ref 18.30–72.70)
WBC: 6.8 10*3/uL (ref 3.9–10.3)

## 2010-04-10 ENCOUNTER — Ambulatory Visit: Payer: Self-pay | Admitting: Family Medicine

## 2010-04-13 ENCOUNTER — Ambulatory Visit: Payer: Self-pay | Admitting: Family Medicine

## 2010-04-26 ENCOUNTER — Encounter
Admission: RE | Admit: 2010-04-26 | Discharge: 2010-04-26 | Payer: Self-pay | Source: Home / Self Care | Attending: Family Medicine | Admitting: Family Medicine

## 2010-05-24 ENCOUNTER — Ambulatory Visit: Payer: Self-pay | Admitting: Oncology

## 2010-05-27 ENCOUNTER — Encounter: Payer: Self-pay | Admitting: Gastroenterology

## 2010-05-28 LAB — CBC WITH DIFFERENTIAL/PLATELET
BASO%: 0.7 % (ref 0.0–2.0)
EOS%: 0.5 % (ref 0.0–7.0)
Eosinophils Absolute: 0 10*3/uL (ref 0.0–0.5)
MCH: 24.4 pg — ABNORMAL LOW (ref 25.1–34.0)
MCV: 78.7 fL — ABNORMAL LOW (ref 79.5–101.0)
MONO#: 0.8 10*3/uL (ref 0.1–0.9)
NEUT#: 4 10*3/uL (ref 1.5–6.5)
NEUT%: 58.4 % (ref 38.4–76.8)
Platelets: 190 10*3/uL (ref 145–400)
RDW: 14.1 % (ref 11.2–14.5)
WBC: 6.9 10*3/uL (ref 3.9–10.3)

## 2010-07-23 ENCOUNTER — Encounter (HOSPITAL_BASED_OUTPATIENT_CLINIC_OR_DEPARTMENT_OTHER): Payer: Medicaid Other | Admitting: Oncology

## 2010-07-23 ENCOUNTER — Other Ambulatory Visit: Payer: Self-pay | Admitting: Oncology

## 2010-07-23 DIAGNOSIS — D649 Anemia, unspecified: Secondary | ICD-10-CM

## 2010-07-23 LAB — CBC WITH DIFFERENTIAL/PLATELET
Eosinophils Absolute: 0.1 10*3/uL (ref 0.0–0.5)
HCT: 32.8 % — ABNORMAL LOW (ref 34.8–46.6)
HGB: 10.2 g/dL — ABNORMAL LOW (ref 11.6–15.9)
MCH: 23.1 pg — ABNORMAL LOW (ref 25.1–34.0)
MCHC: 31.1 g/dL — ABNORMAL LOW (ref 31.5–36.0)
MONO#: 0.7 10*3/uL (ref 0.1–0.9)
NEUT#: 3.8 10*3/uL (ref 1.5–6.5)
NEUT%: 57.2 % (ref 38.4–76.8)
Platelets: 229 10*3/uL (ref 145–400)
RBC: 4.41 10*6/uL (ref 3.70–5.45)
RDW: 14.9 % — ABNORMAL HIGH (ref 11.2–14.5)
lymph#: 2.1 10*3/uL (ref 0.9–3.3)

## 2010-09-20 ENCOUNTER — Other Ambulatory Visit: Payer: Self-pay | Admitting: Oncology

## 2010-09-20 ENCOUNTER — Encounter (HOSPITAL_BASED_OUTPATIENT_CLINIC_OR_DEPARTMENT_OTHER): Payer: Medicare Other | Admitting: Oncology

## 2010-09-20 DIAGNOSIS — D649 Anemia, unspecified: Secondary | ICD-10-CM

## 2010-09-20 LAB — CBC WITH DIFFERENTIAL/PLATELET
BASO%: 0.1 % (ref 0.0–2.0)
EOS%: 0.8 % (ref 0.0–7.0)
Eosinophils Absolute: 0.1 10*3/uL (ref 0.0–0.5)
HCT: 30.4 % — ABNORMAL LOW (ref 34.8–46.6)
LYMPH%: 34.9 % (ref 14.0–49.7)
MCHC: 30.9 g/dL — ABNORMAL LOW (ref 31.5–36.0)
MONO%: 9.7 % (ref 0.0–14.0)
NEUT%: 54.5 % (ref 38.4–76.8)
lymph#: 2.2 10*3/uL (ref 0.9–3.3)

## 2010-09-21 NOTE — Op Note (Signed)
NAMEGENIECE, AKERS NO.:  [id number]   MEDICAL RECORD NO.:  [id number]                   PATIENT TYPE:  AMB   LOCATION:  ENDO                                 FACILITY:  [facility]   PHYSICIAN:  Anselmo Rod, M.D.               DATE OF BIRTH:  Sep 08, 1921   DATE OF PROCEDURE:  09/14/2003  DATE OF DISCHARGE:                                 OPERATIVE REPORT   PROCEDURE PERFORMED:  Esophagogastroduodenoscopy.   ENDOSCOPIST:  Charna Elizabeth, M.D.   INSTRUMENT USED:  Olympus video panendoscope.   INDICATIONS FOR PROCEDURE:  The patient is a 77 year old African-American  female with a history of iron deficiency anemia undergoing  esophagogastroduodenoscopy to rule out peptic ulcer disease, esophagitis,  gastritis, etc.   PREPROCEDURE PREPARATION:  Informed consent was procured from the patient.  The patient was fasted for eight hours prior to the procedure.   PREPROCEDURE PHYSICAL:  The patient had stable vital signs.  Neck supple,  chest clear to auscultation.  S1, S2 regular, but somewhat tachycardic at  108 to 130 beats per minute (patient has not taken her antihypertensive this  morning).  Abdomen soft with normal bowel sounds.   DESCRIPTION OF PROCEDURE:  The patient was placed in the left lateral  decubitus position and sedated with 40 mg of Demerol and 3 mg of Versed  intravenously.  Once the patient was adequately sedated and maintained on  low-flow oxygen and continuous cardiac monitoring, the Olympus video  panendoscope was advanced through the mouth piece over the tongue into the  esophagus under direct vision.  The entire esophagus appeared normal with no  evidence of ring, stricture, masses, esophagitis or Barrett's mucosa.  The  scope was then advanced to the stomach.  The entire gastric mucosa appeared  normal.  A small hiatal hernia was seen on high retroflexion.  The proximal  small bowel appeared normal.  There was no outlet obstruction.   The patient  tolerated the procedure well without complications.   IMPRESSION:  Normal esophagogastroduodenoscopy except for a small hiatal  hernia.   RECOMMENDATIONS:  1. Proceed with colonoscopy at this time.  2. Avoid all nonsteroidals including aspirin for now.  3. Follow antireflux measures.  4. Further recommendations to be made after colonoscopy has been done.                                               Anselmo Rod, M.D.    JNM/MEDQ  D:  09/14/2003  T:  09/15/2003  Job:  [id number]   cc:   Sharlot Gowda, M.D.  [address]  [place], [place] [address]  Fax: [phone]

## 2010-09-21 NOTE — Op Note (Signed)
Chelsea, Zuniga NO.:  [id number]   MEDICAL RECORD NO.:  [id number]                   PATIENT TYPE:  AMB   LOCATION:  ENDO                                 FACILITY:  [facility]   PHYSICIAN:  Anselmo Rod, M.D.               DATE OF BIRTH:  05/23/1921   DATE OF PROCEDURE:  09/14/2003  DATE OF DISCHARGE:                                 OPERATIVE REPORT   PROCEDURE PERFORMED:  Screening colonoscopy.   ENDOSCOPIST:  Charna Elizabeth, M.D.   INSTRUMENT USED:  Olympus video adjustable pediatric colonoscope.   INDICATIONS FOR PROCEDURE:  The patient is a 77 year old African-American  female with a history of iron deficiency anemia and a normal  esophagogastroduodenoscopy except for a small hiatal hernia.  Rule out  colonic polyps, masses, etc.   PREPROCEDURE PREPARATION:  Informed consent was procured from the patient.  The patient was fasted for eight hours prior to the procedure and prepped  with a bottle of magnesium citrate and a gallon of GoLYTELY the night prior  to the procedure.   PREPROCEDURE PHYSICAL:  The patient had stable vital signs.  Neck supple.  Chest clear to auscultation.  S1 and S2 regular.  Abdomen soft with normal  bowel sounds.   DESCRIPTION OF PROCEDURE:  The patient was placed in left lateral decubitus  position and sedated with an additional 10 mg of Demerol and 2 mg of Versed  intravenously.  Once the patient was adequately sedated and maintained on  low flow oxygen and continuous cardiac monitoring, the Olympus video  colonoscope was advanced from the rectum to the cecum with difficulty.  The  patient's position  had to be changed from the left lateral to the supine  and the right lateral position with gentle application of abdominal pressure  to reach the cecum.  The patient had a very tortuous colon.  No masses,  polyps, erosions, ulcerations or diverticula were seen.  Small internal  hemorrhoids were appreciated on  retroflexion in the rectum.  There was some  residual stool in the colon and small lesions could have been missed.  The  patient tolerated the procedure well without immediate complications.   IMPRESSION:  Very tortuous colon, small internal hemorrhoids.  Otherwise  unrevealing colonoscopy.   RECOMMENDATIONS:  Schedule small bowel follow-through to complete  evaluation.  Further recommendations will be made in follow-up.                                               Anselmo Rod, M.D.    JNM/MEDQ  D:  09/14/2003  T:  09/15/2003  Job:  [id number]   cc:   Sharlot Gowda, M.D.  [address]  [place], [place] [address]  Fax: [phone]

## 2010-10-02 ENCOUNTER — Other Ambulatory Visit: Payer: Self-pay | Admitting: Family Medicine

## 2010-11-02 ENCOUNTER — Ambulatory Visit: Payer: Medicare Other | Admitting: Medical

## 2010-11-05 ENCOUNTER — Encounter: Payer: Self-pay | Admitting: Family Medicine

## 2010-11-05 ENCOUNTER — Other Ambulatory Visit: Payer: Self-pay | Admitting: Family Medicine

## 2010-11-05 ENCOUNTER — Ambulatory Visit (INDEPENDENT_AMBULATORY_CARE_PROVIDER_SITE_OTHER): Payer: Medicare Other | Admitting: Family Medicine

## 2010-11-05 VITALS — BP 140/70 | HR 80

## 2010-11-05 DIAGNOSIS — H6120 Impacted cerumen, unspecified ear: Secondary | ICD-10-CM

## 2010-11-05 NOTE — Progress Notes (Signed)
  Subjective:    Patient ID: Chelsea Zuniga, female    DOB: 1922-03-11, 77 y.o.   MRN: [id number]  HPI she is here for consult concerning for bilateral cerumen impaction and difficulty hearing.    Review of Systems     Objective:   Physical Exam both canals are filled with cerumen. The left was lavaged without too much difficulty. Several attempts were made to lyse the right canal with not much success.       Assessment & Plan:  Bilateral cerumen impaction. Continue to use the ear wax softening and return here later this week for further lavage

## 2010-11-05 NOTE — Patient Instructions (Signed)
Continue to use the earwax softening and come back Thursday or Friday.

## 2010-11-08 ENCOUNTER — Ambulatory Visit (INDEPENDENT_AMBULATORY_CARE_PROVIDER_SITE_OTHER): Payer: Medicare Other | Admitting: Family Medicine

## 2010-11-08 ENCOUNTER — Encounter: Payer: Self-pay | Admitting: Family Medicine

## 2010-11-08 VITALS — BP 120/70 | HR 60 | Wt 106.0 lb

## 2010-11-08 DIAGNOSIS — H6120 Impacted cerumen, unspecified ear: Secondary | ICD-10-CM

## 2010-11-08 NOTE — Progress Notes (Signed)
  Subjective:    Patient ID: Chelsea Zuniga, female    DOB: 1921-07-11, 77 y.o.   MRN: [id number]  HPI She is here for recheck for treatment of impacted cerumen. They have been using drops in the ear.   Review of Systems     Objective:   Physical Exam The right canal still has material in it that was unable to be lavaged.       Assessment & Plan:  Cerumen impaction. Refer to ENT for further care.

## 2010-11-09 ENCOUNTER — Other Ambulatory Visit: Payer: Self-pay | Admitting: Family Medicine

## 2010-11-19 ENCOUNTER — Encounter (HOSPITAL_BASED_OUTPATIENT_CLINIC_OR_DEPARTMENT_OTHER): Payer: Medicare Other | Admitting: Oncology

## 2010-11-19 ENCOUNTER — Other Ambulatory Visit: Payer: Self-pay | Admitting: Oncology

## 2010-11-19 DIAGNOSIS — D649 Anemia, unspecified: Secondary | ICD-10-CM

## 2010-11-19 LAB — CBC WITH DIFFERENTIAL/PLATELET
BASO%: 1.1 % (ref 0.0–2.0)
EOS%: 0.5 % (ref 0.0–7.0)
Eosinophils Absolute: 0 10*3/uL (ref 0.0–0.5)
MCV: 77.3 fL — ABNORMAL LOW (ref 79.5–101.0)
MONO#: 0.7 10*3/uL (ref 0.1–0.9)
MONO%: 10.6 % (ref 0.0–14.0)
NEUT#: 3.7 10*3/uL (ref 1.5–6.5)
Platelets: 209 10*3/uL (ref 145–400)
WBC: 6.9 10*3/uL (ref 3.9–10.3)

## 2010-11-21 ENCOUNTER — Other Ambulatory Visit: Payer: Self-pay | Admitting: Family Medicine

## 2010-12-07 ENCOUNTER — Other Ambulatory Visit: Payer: Self-pay | Admitting: Family Medicine

## 2011-01-13 ENCOUNTER — Other Ambulatory Visit: Payer: Self-pay | Admitting: Family Medicine

## 2011-01-14 ENCOUNTER — Other Ambulatory Visit: Payer: Self-pay | Admitting: Oncology

## 2011-01-14 ENCOUNTER — Encounter (HOSPITAL_BASED_OUTPATIENT_CLINIC_OR_DEPARTMENT_OTHER): Payer: Medicare Other | Admitting: Oncology

## 2011-01-14 ENCOUNTER — Other Ambulatory Visit: Payer: Self-pay | Admitting: Family Medicine

## 2011-01-14 DIAGNOSIS — D649 Anemia, unspecified: Secondary | ICD-10-CM

## 2011-01-14 LAB — CBC & DIFF AND RETIC
Basophils Absolute: 0 10*3/uL (ref 0.0–0.1)
Eosinophils Absolute: 0.1 10*3/uL (ref 0.0–0.5)
HCT: 35.1 % (ref 34.8–46.6)
HGB: 11.1 g/dL — ABNORMAL LOW (ref 11.6–15.9)
MCV: 75 fL — ABNORMAL LOW (ref 79.5–101.0)
NEUT#: 3.4 10*3/uL (ref 1.5–6.5)
RDW: 15.1 % — ABNORMAL HIGH (ref 11.2–14.5)
Retic %: 0.97 % (ref 0.70–2.10)
Retic Ct Abs: 45.4 10*3/uL (ref 33.70–90.70)
lymph#: 2.4 10*3/uL (ref 0.9–3.3)

## 2011-03-01 ENCOUNTER — Other Ambulatory Visit: Payer: Self-pay | Admitting: Family Medicine

## 2011-03-01 NOTE — Telephone Encounter (Signed)
Is this okay to refill ? Does she need an OV soon for med check

## 2011-03-04 ENCOUNTER — Encounter: Payer: Self-pay | Admitting: Family Medicine

## 2011-03-07 ENCOUNTER — Encounter: Payer: Self-pay | Admitting: Family Medicine

## 2011-03-07 ENCOUNTER — Ambulatory Visit (INDEPENDENT_AMBULATORY_CARE_PROVIDER_SITE_OTHER): Payer: Medicare Other | Admitting: Family Medicine

## 2011-03-07 DIAGNOSIS — Z79899 Other long term (current) drug therapy: Secondary | ICD-10-CM

## 2011-03-07 DIAGNOSIS — E785 Hyperlipidemia, unspecified: Secondary | ICD-10-CM

## 2011-03-07 DIAGNOSIS — E119 Type 2 diabetes mellitus without complications: Secondary | ICD-10-CM

## 2011-03-07 DIAGNOSIS — E1169 Type 2 diabetes mellitus with other specified complication: Secondary | ICD-10-CM

## 2011-03-07 DIAGNOSIS — Z23 Encounter for immunization: Secondary | ICD-10-CM

## 2011-03-07 DIAGNOSIS — E1142 Type 2 diabetes mellitus with diabetic polyneuropathy: Secondary | ICD-10-CM

## 2011-03-07 DIAGNOSIS — D649 Anemia, unspecified: Secondary | ICD-10-CM

## 2011-03-07 DIAGNOSIS — I10 Essential (primary) hypertension: Secondary | ICD-10-CM

## 2011-03-07 DIAGNOSIS — E1159 Type 2 diabetes mellitus with other circulatory complications: Secondary | ICD-10-CM

## 2011-03-07 DIAGNOSIS — K219 Gastro-esophageal reflux disease without esophagitis: Secondary | ICD-10-CM

## 2011-03-07 DIAGNOSIS — E1140 Type 2 diabetes mellitus with diabetic neuropathy, unspecified: Secondary | ICD-10-CM | POA: Insufficient documentation

## 2011-03-07 DIAGNOSIS — E1149 Type 2 diabetes mellitus with other diabetic neurological complication: Secondary | ICD-10-CM

## 2011-03-07 LAB — CBC WITH DIFFERENTIAL/PLATELET
Basophils Absolute: 0 10*3/uL (ref 0.0–0.1)
Basophils Relative: 0 % (ref 0–1)
Eosinophils Absolute: 0 10*3/uL (ref 0.0–0.7)
Eosinophils Relative: 0 % (ref 0–5)
MCH: 23.9 pg — ABNORMAL LOW (ref 26.0–34.0)
MCHC: 30.8 g/dL (ref 30.0–36.0)
MCV: 77.6 fL — ABNORMAL LOW (ref 78.0–100.0)
Neutrophils Relative %: 60 % (ref 43–77)
Platelets: 224 10*3/uL (ref 150–400)
RBC: 4.02 MIL/uL (ref 3.87–5.11)
RDW: 14.9 % (ref 11.5–15.5)

## 2011-03-07 LAB — COMPREHENSIVE METABOLIC PANEL
Chloride: 105 mEq/L (ref 96–112)
Potassium: 4.4 mEq/L (ref 3.5–5.3)
Total Protein: 7 g/dL (ref 6.0–8.3)

## 2011-03-07 LAB — LIPID PANEL
Cholesterol: 226 mg/dL — ABNORMAL HIGH (ref 0–200)
HDL: 72 mg/dL (ref >39)
Triglycerides: 66 mg/dL (ref <150)
VLDL: 13 mg/dL (ref 0–40)

## 2011-03-07 LAB — POCT GLYCOSYLATED HEMOGLOBIN (HGB A1C): Hemoglobin A1C: 6.8

## 2011-03-07 NOTE — Progress Notes (Signed)
  Subjective:    Patient ID: Chelsea Zuniga, female    DOB: June 25, 1921, 77 y.o.   MRN: [id number]  HPI She is here for a diabetes recheck. She does intermittently check her blood sugars and they run in the low 100s. She does not smoke or drink. She has had an eye exam this year. She does occasionally check her feet. Her reflux is under good control. Her daughter who will take care of her has no particular concerns about her.   Review of Systems     Objective:   Physical Exam Alert and in no distress. Exam of her feet shows poor pulses and reflexes area the skin is normal. Sensation is normal.       Assessment & Plan:   1. Diabetes mellitus  CBC with Differential, Comprehensive metabolic panel, Lipid panel, POCT UA - Microalbumin, POCT HgB A1C  2. Hypertension associated with diabetes    3. Hyperlipidemia LDL goal <70  Lipid panel  4. Anemia  CBC with Differential  5. GERD (gastroesophageal reflux disease)    6. Encounter for long-term (current) use of other medications  CBC with Differential, Comprehensive metabolic panel, Lipid panel, POCT UA - Microalbumin   Continue with present medication regimen. Flu shot also given

## 2011-03-07 NOTE — Patient Instructions (Signed)
Continue on your present medications. 

## 2011-03-08 LAB — POCT UA - MICROALBUMIN: Albumin/Creatinine Ratio, Urine, POC: 20.1

## 2011-03-18 ENCOUNTER — Telehealth: Payer: Self-pay | Admitting: Oncology

## 2011-03-18 ENCOUNTER — Ambulatory Visit (HOSPITAL_BASED_OUTPATIENT_CLINIC_OR_DEPARTMENT_OTHER): Payer: Medicare Other | Admitting: Oncology

## 2011-03-18 ENCOUNTER — Other Ambulatory Visit (HOSPITAL_BASED_OUTPATIENT_CLINIC_OR_DEPARTMENT_OTHER): Payer: Medicare Other | Admitting: Lab

## 2011-03-18 ENCOUNTER — Other Ambulatory Visit: Payer: Self-pay | Admitting: Oncology

## 2011-03-18 VITALS — BP 175/64 | HR 82 | Temp 98.4°F | Ht <= 58 in | Wt 106.5 lb

## 2011-03-18 DIAGNOSIS — D649 Anemia, unspecified: Secondary | ICD-10-CM

## 2011-03-18 LAB — CBC & DIFF AND RETIC
BASO%: 0.4 % (ref 0.0–2.0)
EOS%: 0.3 % (ref 0.0–7.0)
HCT: 32.5 % — ABNORMAL LOW (ref 34.8–46.6)
LYMPH%: 35.4 % (ref 14.0–49.7)
MCH: 23.9 pg — ABNORMAL LOW (ref 25.1–34.0)
MCHC: 31.1 g/dL — ABNORMAL LOW (ref 31.5–36.0)
MCV: 77 fL — ABNORMAL LOW (ref 79.5–101.0)
MONO%: 8.9 % (ref 0.0–14.0)
NEUT%: 55 % (ref 38.4–76.8)
lymph#: 2.5 10*3/uL (ref 0.9–3.3)

## 2011-03-18 MED ORDER — DARBEPOETIN ALFA-POLYSORBATE 500 MCG/ML IJ SOLN
300.0000 ug | Freq: Once | INTRAMUSCULAR | Status: AC
  Administered 2011-03-18: 300 ug via SUBCUTANEOUS
  Filled 2011-03-18: qty 1

## 2011-03-18 NOTE — Telephone Encounter (Signed)
Gv pts appts for jan-nov2013 to daughter

## 2011-03-18 NOTE — Progress Notes (Signed)
ID: Chelsea Zuniga   Interval History: Terse today with her daughter for followup of her chronic anemia the interval history is generally unremarkable the daughter, Chelsea Zuniga, her today. Abdomen 82 works in childcare and doesn't get off until 3:30 so late appointments would be good for her. The patient has not alone at home however her granddaughter works out of the house and she is there most of that time  ROS: Review of systems is stable she complains of pain in her legs and difficulty walking she doesn't see very well she has some hearing loss and some sinus issues she thinks she needs new dentures occasionally her ankles 12 little bit and she wears  compression stockings for this otherwise of a sip of the skin detailed review of systems is noncontributory  Medications: I have reviewed the patient's current medications.   Current Outpatient Prescriptions  Medication Sig Dispense Refill  . ACTOPLUS MET 15-850 MG per tablet take 1 tablet by mouth twice a day  60 tablet  4  . amLODipine (NORVASC) 5 MG tablet TAKE 1 TABLET BY MOUTH ONCE DAILY  30 tablet  5  . aspirin 81 MG tablet Take 81 mg by mouth daily.       . cloNIDine (CATAPRES) 0.1 MG tablet Take 0.1 mg by mouth 2 (two) times daily.        . ferrous fumarate (HEMOCYTE - 106 MG FE) 325 (106 FE) MG TABS Take 1 tablet by mouth.        Marland Kitchen lisinopril-hydrochlorothiazide (PRINZIDE,ZESTORETIC) 20-12.5 MG per tablet Take 1 tablet by mouth daily.        . metoprolol (TOPROL-XL) 100 MG 24 hr tablet Take 100 mg by mouth daily.        . simvastatin (ZOCOR) 80 MG tablet Take 80 mg by mouth at bedtime.         Current Facility-Administered Medications  Medication Dose Route Frequency Provider Last Rate Last Dose  . darbepoetin alfa-polysorbate (ARANESP) injection 300 mcg  300 mcg Subcutaneous Once Lowella Dell, MD         Objective: Vital signs in last 24 hours: BP 175/64  Pulse 82  Temp(Src) 98.4 F (36.9 C) (Oral)  Ht 4' 9.5" (1.461 m)   Wt 106 lb 8 oz (48.308 kg)  BMI 22.65 kg/m2   Physical Exam:    Sclerae unicteric  Oropharynx clear  No peripheral adenopathy  Lungs clear -- no rales or rhonchi  Heart regular rate and rhythm  Abdomen benign  MSK no focal spinal tenderness, no peripheral edema  Neuro nonfocal    Lab Results:   BMET    Component Value Date/Time   NA 140 03/07/2011 1605   K 4.4 03/07/2011 1605   CL 105 03/07/2011 1605   CO2 26 03/07/2011 1605   GLUCOSE 162* 03/07/2011 1605   BUN 15 03/07/2011 1605   CREATININE 0.78 03/07/2011 1605   CREATININE 0.78 01/14/2006 1515   CALCIUM 9.5 03/07/2011 1605     CMP     Component Value Date/Time   NA 140 03/07/2011 1605   K 4.4 03/07/2011 1605   CL 105 03/07/2011 1605   CO2 26 03/07/2011 1605   GLUCOSE 162* 03/07/2011 1605   BUN 15 03/07/2011 1605   CREATININE 0.78 03/07/2011 1605   CREATININE 0.78 01/14/2006 1515   CALCIUM 9.5 03/07/2011 1605   PROT 7.0 03/07/2011 1605   ALBUMIN 4.1 03/07/2011 1605   AST 14 03/07/2011 1605   ALT 9 03/07/2011 1605  ALKPHOS 49 03/07/2011 1605   BILITOT 0.4 03/07/2011 1605    CBC    Component Value Date/Time   WBC 6.7 03/07/2011 1605   RBC 4.02 03/07/2011 1605   HGB 9.6* 03/07/2011 1605   HGB 10.4* 11/19/2010 1537   HCT 31.2* 03/07/2011 1605   HCT 33.3* 11/19/2010 1537   PLT 224 03/07/2011 1605   PLT 209 11/19/2010 1537   MCV 77.6* 03/07/2011 1605   MCV 77.3* 11/19/2010 1537   MCH 23.9* 03/07/2011 1605   MCHC 30.8 03/07/2011 1605   RDW 14.9 03/07/2011 1605   LYMPHSABS 1.9 03/07/2011 1605   MONOABS 0.7 03/07/2011 1605   EOSABS 0.0 03/07/2011 1605   EOSABS 0.0 11/19/2010 1537   BASOSABS 0.0 03/07/2011 1605   BASOSABS 0.1 11/19/2010 1537        Studies/Results: No results found.  Assessment: He 5-year-old [place] woman with a history of chronic anemia responsive to Arimidex which she receives and a every 2 month basis   Plan: Making no changes in her plan. As she will receive her treatment today and every 2 months we  will do lab work and consider Anas. She will return to see me in one year certainly if she were to develop chest pain or pressure wasn't worsening fatigue dizziness palpitations or other symptoms her daughter knows to call.  Chelsea Zuniga C 03/18/2011

## 2011-03-25 ENCOUNTER — Other Ambulatory Visit: Payer: Self-pay | Admitting: Family Medicine

## 2011-03-25 DIAGNOSIS — Z1231 Encounter for screening mammogram for malignant neoplasm of breast: Secondary | ICD-10-CM

## 2011-04-05 ENCOUNTER — Other Ambulatory Visit: Payer: Self-pay | Admitting: Family Medicine

## 2011-04-06 ENCOUNTER — Other Ambulatory Visit: Payer: Self-pay | Admitting: Family Medicine

## 2011-04-08 NOTE — Telephone Encounter (Signed)
Is this ok?

## 2011-05-01 ENCOUNTER — Ambulatory Visit
Admission: RE | Admit: 2011-05-01 | Discharge: 2011-05-01 | Disposition: A | Discharge status: Home / Self Care | Payer: Medicare Other | Source: Ambulatory Visit | Attending: Family Medicine | Admitting: Family Medicine

## 2011-05-01 DIAGNOSIS — Z1231 Encounter for screening mammogram for malignant neoplasm of breast: Secondary | ICD-10-CM

## 2011-05-13 ENCOUNTER — Other Ambulatory Visit (HOSPITAL_BASED_OUTPATIENT_CLINIC_OR_DEPARTMENT_OTHER): Payer: Medicare Other | Admitting: Lab

## 2011-05-13 ENCOUNTER — Other Ambulatory Visit: Payer: Self-pay | Admitting: Oncology

## 2011-05-13 ENCOUNTER — Ambulatory Visit: Payer: Medicare Other

## 2011-05-13 DIAGNOSIS — D649 Anemia, unspecified: Secondary | ICD-10-CM

## 2011-05-13 LAB — CBC WITH DIFFERENTIAL/PLATELET
BASO%: 0.1 % (ref 0.0–2.0)
Basophils Absolute: 0 10*3/uL (ref 0.0–0.1)
EOS%: 0.6 % (ref 0.0–7.0)
HCT: 35.4 % (ref 34.8–46.6)
HGB: 11.1 g/dL — ABNORMAL LOW (ref 11.6–15.9)
LYMPH%: 34 % (ref 14.0–49.7)
MCH: 24.2 pg — ABNORMAL LOW (ref 25.1–34.0)
MCHC: 31.3 g/dL — ABNORMAL LOW (ref 31.5–36.0)
MCV: 77.3 fL — ABNORMAL LOW (ref 79.5–101.0)
NEUT%: 54.8 % (ref 38.4–76.8)
Platelets: 190 10*3/uL (ref 145–400)

## 2011-05-13 MED ORDER — DARBEPOETIN ALFA-POLYSORBATE 500 MCG/ML IJ SOLN
300.0000 ug | Freq: Once | INTRAMUSCULAR | Status: DC
  Filled 2011-05-13: qty 1

## 2011-05-29 ENCOUNTER — Other Ambulatory Visit: Payer: Self-pay | Admitting: Family Medicine

## 2011-07-01 ENCOUNTER — Other Ambulatory Visit: Payer: Self-pay | Admitting: Family Medicine

## 2011-07-05 ENCOUNTER — Other Ambulatory Visit: Payer: Self-pay | Admitting: Family Medicine

## 2011-07-08 ENCOUNTER — Ambulatory Visit: Payer: Medicare Other

## 2011-07-08 ENCOUNTER — Other Ambulatory Visit (HOSPITAL_BASED_OUTPATIENT_CLINIC_OR_DEPARTMENT_OTHER): Payer: Medicare Other | Admitting: Lab

## 2011-07-08 DIAGNOSIS — D649 Anemia, unspecified: Secondary | ICD-10-CM

## 2011-07-08 LAB — CBC WITH DIFFERENTIAL/PLATELET
BASO%: 0.2 % (ref 0.0–2.0)
LYMPH%: 29.7 % (ref 14.0–49.7)
MCHC: 30.9 g/dL — ABNORMAL LOW (ref 31.5–36.0)
MCV: 77 fL — ABNORMAL LOW (ref 79.5–101.0)
MONO#: 0.8 10*3/uL (ref 0.1–0.9)
MONO%: 9.6 % (ref 0.0–14.0)
NEUT#: 5.1 10*3/uL (ref 1.5–6.5)
Platelets: 224 10*3/uL (ref 145–400)
RBC: 4.04 10*6/uL (ref 3.70–5.45)
RDW: 15.2 % — ABNORMAL HIGH (ref 11.2–14.5)
WBC: 8.6 10*3/uL (ref 3.9–10.3)

## 2011-07-08 NOTE — Progress Notes (Signed)
Hgb 11.1; held Aranesp today. Copy of labs given to patient and instructed to call with any changes and to return for future appointments.

## 2011-08-30 ENCOUNTER — Other Ambulatory Visit: Payer: Self-pay | Admitting: Family Medicine

## 2011-09-09 ENCOUNTER — Other Ambulatory Visit: Payer: Medicare Other | Admitting: Lab

## 2011-09-09 ENCOUNTER — Ambulatory Visit: Payer: Medicare Other

## 2011-09-30 ENCOUNTER — Other Ambulatory Visit: Payer: Self-pay | Admitting: Family Medicine

## 2011-10-29 ENCOUNTER — Other Ambulatory Visit: Payer: Self-pay | Admitting: Family Medicine

## 2011-11-11 ENCOUNTER — Ambulatory Visit (HOSPITAL_BASED_OUTPATIENT_CLINIC_OR_DEPARTMENT_OTHER): Payer: Medicare Other

## 2011-11-11 ENCOUNTER — Other Ambulatory Visit (HOSPITAL_BASED_OUTPATIENT_CLINIC_OR_DEPARTMENT_OTHER): Payer: Medicare Other | Admitting: Lab

## 2011-11-11 VITALS — BP 172/66 | HR 86 | Temp 98.3°F

## 2011-11-11 DIAGNOSIS — D649 Anemia, unspecified: Secondary | ICD-10-CM

## 2011-11-11 LAB — CBC WITH DIFFERENTIAL/PLATELET
BASO%: 0.3 % (ref 0.0–2.0)
Eosinophils Absolute: 0 10*3/uL (ref 0.0–0.5)
MCHC: 30.7 g/dL — ABNORMAL LOW (ref 31.5–36.0)
MONO#: 0.6 10*3/uL (ref 0.1–0.9)
NEUT#: 3.4 10*3/uL (ref 1.5–6.5)
RBC: 4.02 10*6/uL (ref 3.70–5.45)
WBC: 5.9 10*3/uL (ref 3.9–10.3)
lymph#: 1.8 10*3/uL (ref 0.9–3.3)

## 2011-11-11 MED ORDER — DARBEPOETIN ALFA-POLYSORBATE 300 MCG/0.6ML IJ SOLN
300.0000 ug | Freq: Once | INTRAMUSCULAR | Status: AC
  Administered 2011-11-11: 300 ug via SUBCUTANEOUS
  Filled 2011-11-11: qty 0.6

## 2011-11-12 ENCOUNTER — Ambulatory Visit (INDEPENDENT_AMBULATORY_CARE_PROVIDER_SITE_OTHER): Payer: Medicare Other | Admitting: Family Medicine

## 2011-11-12 ENCOUNTER — Encounter: Payer: Self-pay | Admitting: Family Medicine

## 2011-11-12 VITALS — BP 126/70 | HR 80 | Wt 100.0 lb

## 2011-11-12 DIAGNOSIS — H6120 Impacted cerumen, unspecified ear: Secondary | ICD-10-CM

## 2011-11-12 DIAGNOSIS — E1169 Type 2 diabetes mellitus with other specified complication: Secondary | ICD-10-CM

## 2011-11-12 DIAGNOSIS — E785 Hyperlipidemia, unspecified: Secondary | ICD-10-CM

## 2011-11-12 DIAGNOSIS — E119 Type 2 diabetes mellitus without complications: Secondary | ICD-10-CM

## 2011-11-12 DIAGNOSIS — I10 Essential (primary) hypertension: Secondary | ICD-10-CM

## 2011-11-12 DIAGNOSIS — D649 Anemia, unspecified: Secondary | ICD-10-CM

## 2011-11-12 LAB — LIPID PANEL
HDL: 68 mg/dL (ref >39)
LDL Cholesterol: 113 mg/dL — ABNORMAL HIGH (ref 0–99)
Total CHOL/HDL Ratio: 2.9 Ratio
VLDL: 15 mg/dL (ref 0–40)

## 2011-11-12 LAB — COMPREHENSIVE METABOLIC PANEL
ALT: 9 U/L (ref 0–35)
AST: 14 U/L (ref 0–37)
Alkaline Phosphatase: 43 U/L (ref 39–117)
BUN: 16 mg/dL (ref 6–23)
Calcium: 9.3 mg/dL (ref 8.4–10.5)
Chloride: 102 mEq/L (ref 96–112)
Creat: 0.87 mg/dL (ref 0.50–1.10)
Total Bilirubin: 0.3 mg/dL (ref 0.3–1.2)

## 2011-11-12 LAB — POCT GLYCOSYLATED HEMOGLOBIN (HGB A1C): Hemoglobin A1C: 6.9

## 2011-11-12 MED ORDER — PIOGLITAZONE HCL-METFORMIN HCL 15-850 MG PO TABS: 1.0000 | ORAL_TABLET | Freq: Two times a day (BID) | ORAL | Status: DC

## 2011-11-12 MED ORDER — SIMVASTATIN 80 MG PO TABS: 80.0000 mg | ORAL_TABLET | Freq: Every day | ORAL | Status: DC

## 2011-11-12 NOTE — Progress Notes (Signed)
  Subjective:    Patient ID: Chelsea Zuniga, female    DOB: [birth year], 78 y.o.   MRN: [id number]  HPI She is here for recheck. Her main complaint today is difficulty with hearing. She continues on medications listed in the chart. She was recently seen by her hematologist and given another injection for her anemia. She has had an eye exam in the last several months. She does check her feet regularly. She lives with her daughter. She does not smoke or drink and has very limited physical activity.   Review of Systems     Objective:   Physical Exam Alert and in no distress. Cerumen present in both ears which was lavaged. Throat is clear. Neck is supple without adenopathy. Cardiac and lung exam normal. Hemoglobin A1c is 6.9      Assessment & Plan:   1. Type II or unspecified type diabetes mellitus without mention of complication, not stated as uncontrolled  POCT glycosylated hemoglobin (Hb A1C)  2. Hypertension associated with diabetes    3. Hyperlipidemia LDL goal <70    4. Anemia    5. Cerumen impaction

## 2011-11-13 ENCOUNTER — Ambulatory Visit: Payer: Medicare Other

## 2011-11-26 ENCOUNTER — Other Ambulatory Visit: Payer: Self-pay | Admitting: Family Medicine

## 2012-01-13 ENCOUNTER — Other Ambulatory Visit (HOSPITAL_BASED_OUTPATIENT_CLINIC_OR_DEPARTMENT_OTHER): Payer: Medicare Other

## 2012-01-13 ENCOUNTER — Ambulatory Visit (HOSPITAL_BASED_OUTPATIENT_CLINIC_OR_DEPARTMENT_OTHER): Payer: Medicare Other

## 2012-01-13 VITALS — BP 171/70 | HR 89 | Temp 97.4°F

## 2012-01-13 DIAGNOSIS — D649 Anemia, unspecified: Secondary | ICD-10-CM

## 2012-01-13 LAB — CBC WITH DIFFERENTIAL/PLATELET
BASO%: 0.6 % (ref 0.0–2.0)
Basophils Absolute: 0 10*3/uL (ref 0.0–0.1)
Eosinophils Absolute: 0.2 10*3/uL (ref 0.0–0.5)
HCT: 34 % — ABNORMAL LOW (ref 34.8–46.6)
HGB: 10.5 g/dL — ABNORMAL LOW (ref 11.6–15.9)
LYMPH%: 29.6 % (ref 14.0–49.7)
MONO#: 0.6 10*3/uL (ref 0.1–0.9)
NEUT#: 3.9 10*3/uL (ref 1.5–6.5)
NEUT%: 56.7 % (ref 38.4–76.8)
Platelets: 200 10*3/uL (ref 145–400)
WBC: 6.8 10*3/uL (ref 3.9–10.3)
lymph#: 2 10*3/uL (ref 0.9–3.3)

## 2012-01-13 MED ORDER — DARBEPOETIN ALFA-POLYSORBATE 300 MCG/0.6ML IJ SOLN
300.0000 ug | Freq: Once | INTRAMUSCULAR | Status: AC
  Administered 2012-01-13: 300 ug via SUBCUTANEOUS
  Filled 2012-01-13: qty 0.6

## 2012-03-09 ENCOUNTER — Ambulatory Visit (HOSPITAL_BASED_OUTPATIENT_CLINIC_OR_DEPARTMENT_OTHER): Payer: Medicare Other

## 2012-03-09 ENCOUNTER — Ambulatory Visit: Payer: Medicare Other | Admitting: Oncology

## 2012-03-09 ENCOUNTER — Other Ambulatory Visit (HOSPITAL_BASED_OUTPATIENT_CLINIC_OR_DEPARTMENT_OTHER): Payer: Medicare Other | Admitting: Lab

## 2012-03-09 ENCOUNTER — Other Ambulatory Visit: Payer: Medicare Other | Admitting: Lab

## 2012-03-09 VITALS — BP 164/70 | HR 98 | Temp 98.5°F

## 2012-03-09 DIAGNOSIS — D649 Anemia, unspecified: Secondary | ICD-10-CM

## 2012-03-09 LAB — CBC WITH DIFFERENTIAL/PLATELET
BASO%: 0.4 % (ref 0.0–2.0)
Basophils Absolute: 0 10*3/uL (ref 0.0–0.1)
EOS%: 3.6 % (ref 0.0–7.0)
HCT: 34.3 % — ABNORMAL LOW (ref 34.8–46.6)
HGB: 10.8 g/dL — ABNORMAL LOW (ref 11.6–15.9)
LYMPH%: 30.3 % (ref 14.0–49.7)
MCH: 24.4 pg — ABNORMAL LOW (ref 25.1–34.0)
MCHC: 31.5 g/dL (ref 31.5–36.0)
NEUT%: 57 % (ref 38.4–76.8)
Platelets: 187 10*3/uL (ref 145–400)
lymph#: 2.3 10*3/uL (ref 0.9–3.3)

## 2012-03-09 MED ORDER — DARBEPOETIN ALFA-POLYSORBATE 300 MCG/0.6ML IJ SOLN
300.0000 ug | Freq: Once | INTRAMUSCULAR | Status: AC
  Administered 2012-03-09: 300 ug via SUBCUTANEOUS
  Filled 2012-03-09: qty 0.6

## 2012-03-23 ENCOUNTER — Ambulatory Visit (HOSPITAL_BASED_OUTPATIENT_CLINIC_OR_DEPARTMENT_OTHER): Payer: Medicare Other | Admitting: Oncology

## 2012-03-23 VITALS — BP 166/65 | HR 89 | Temp 98.3°F | Resp 18 | Ht <= 58 in | Wt 110.5 lb

## 2012-03-23 DIAGNOSIS — D649 Anemia, unspecified: Secondary | ICD-10-CM

## 2012-03-23 NOTE — Progress Notes (Signed)
ID: Chelsea Zuniga   DOB: 1921-09-30  MR#: [id number]  CSN#:[id number]  PCP: Chelsea Herter, MD GYN:  SU:  OTHER MD:   HISTORY OF PRESENT ILLNESS: Chelsea Zuniga is a 78 year old, Chelsea Zuniga Zuniga referred by Dr. Susann Zuniga for evaluation of anemia.  We have hemoglobin levels dating back to October 2004, when the hemoglobin level was 11.6, with an MCV of 76.  The white cell count was 12.8, platelets 358,000.  Serum creatinine 0.7, and liver function tests were normal.  The total protein was slightly elevated at 8.6, with an albumin of 4.6.    In March 2005, the patient's hemoglobin was low, but I cannot read it in this faxed copy.  On 01/11/04, the hemoglobin was 10.4, with an MCV of 77.  The white cell count was 9.7, and the platelets 292,000.  On the same day, Dr. Susann Zuniga obtained a ferritin which was 57, with an iron saturation of 17% (the patient is on iron supplementation orally). Her subsequent history is as detailed below  INTERVAL HISTORY: Chelsea Zuniga with her daughter Chelsea Zuniga for followup of Chelsea Zuniga anemia. The interval history is unremarkable. She is doing "pretty good". She is tolerating the Chelsea Zuniga. with no side effects that she is aware of.  REVIEW OF SYSTEMS: Occasionally she has bilateral ankle swelling. This comes and goes. It is not a major issue. She has a poor appetite and occasional loose stools, but has not lost any weight. She has occasional headaches, and of course she has her diabetes problems. A detailed review of systems is otherwise stable.  PAST MEDICAL HISTORY: Past Medical History  Diagnosis Date  . Diabetes mellitus   . Hypertension   . Dyslipidemia   . Arthritis   . Diverticulosis   . Hemorrhoids   . Personal history of colonic polyps   . Aortic stenosis   . Anemia   . GERD (gastroesophageal reflux disease)   . Bowen's disease   Significant for  history of colonic polyps, history of hemorrhoids, history of diverticular disease,  history of diabetes mellitus with retinopathy and nephropathy, history of GERD, history of hypertension, history of hypercholesterolemia, history of osteoarthritis, and she is status post cataract surgery and status post total vaginal hysterectomy.    PAST SURGICAL HISTORY: Past Surgical History  Procedure Date  . Abdominal hysterectomy 1997  . Small intestine surgery 1999    FAMILY HISTORY No family history on file. The patient's father died from "old age."  Her mother died from coronary artery disease.    GYNECOLOGIC HISTORY: She is G2, P2.     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  She has been a widow since 12 when her husband died from a heart attack.  The patient lives with her daughter, Chelsea Zuniga, Chelsea daughter, Chelsea Zuniga, the patient's Chelsea Zuniga.  Chelsea Zuniga, the patient's other child, is [place] Zuniga, and he lives in [place].  The patient is a Control and instrumentation engineer.     ADVANCED DIRECTIVES:  HEALTH MAINTENANCE: History  Substance Use Topics  . Smoking status: Never Smoker   . Smokeless tobacco: Never Used  . Alcohol Use: Not on file     Colonoscopy:  PAP:  Bone density:  Lipid panel:  No Known Allergies  Current Outpatient Prescriptions  Medication Sig Dispense Refill  . amLODipine (NORVASC) 5 MG tablet take 1 tablet by mouth once daily  30 tablet  5  . aspirin 81 MG tablet Take  81 mg by mouth daily.       . cloNIDine (CATAPRES) 0.1 MG tablet take 1 tablet by mouth twice a day  60 tablet  PRN  . ferrous fumarate (HEMOCYTE - 106 MG FE) 325 (106 FE) MG TABS Take 1 tablet by mouth.        Marland Kitchen lisinopril-hydrochlorothiazide (PRINZIDE,ZESTORETIC) 20-12.5 MG per tablet take 1 tablet by mouth once daily  30 tablet  PRN  . metoprolol succinate (TOPROL-XL) 100 MG 24 hr tablet take 1 tablet by mouth once daily  30 tablet  PRN  . pioglitazone-metformin (ACTOPLUS MET) 15-850 MG per tablet Take 1 tablet by mouth 2  (two) times daily with a meal.  60 tablet  5  . simvastatin (ZOCOR) 80 MG tablet Take 1 tablet (80 mg total) by mouth at bedtime.  30 tablet  11    OBJECTIVE: Chelsea Zuniga in no acute distress Filed Vitals:   03/23/12 1538  BP: 166/65  Pulse: 89  Temp: 98.3 F (36.8 C)  Resp: 18     Body mass index is 23.50 kg/(m^2).    ECOG FS:2  Sclerae unicteric Oropharynx clear No cervical or supraclavicular adenopathy Lungs no rales or rhonchi Heart regular rate and rhythm Abd benign MSK no focal spinal tenderness Neuro: nonfocal Breasts: Deferred   LAB RESULTS: Lab Results  Component Value Date   WBC 7.7 03/09/2012   NEUTROABS 4.4 03/09/2012   HGB 10.8* 03/09/2012   HCT 34.3* 03/09/2012   MCV 77.4* 03/09/2012   PLT 187 03/09/2012      Chemistry      Component Value Date/Time   NA 138 11/12/2011 1613   K 4.3 11/12/2011 1613   CL 102 11/12/2011 1613   CO2 28 11/12/2011 1613   BUN 16 11/12/2011 1613   CREATININE 0.87 11/12/2011 1613   CREATININE 0.78 01/14/2006 1515      Component Value Date/Time   CALCIUM 9.3 11/12/2011 1613   ALKPHOS 43 11/12/2011 1613   AST 14 11/12/2011 1613   ALT 9 11/12/2011 1613   BILITOT 0.3 11/12/2011 1613       No results found for this basename: LABCA2    No components found with this basename: LABCA125    No results found for this basename: INR:1;PROTIME:1 in the last 168 hours  Urinalysis No results found for this basename: colorurine, appearanceur, labspec, phurine, glucoseu, hgbur, bilirubinur, ketonesur, proteinur, urobilinogen, nitrite, leukocytesur    STUDIES: No results found.  ASSESSMENT: 78 y.o. Chelsea Zuniga Zuniga with a history of chronic anemia responding to minimal doses of Chelsea Zuniga.  PLAN: She will continue to receive aranesp on an every 2 month basis, her next dose being due in January. She will see me again in one year. She knows to call for any problems that may develop before the next visit.   Karder Goodin C     03/25/2012

## 2012-03-24 ENCOUNTER — Telehealth: Payer: Self-pay | Admitting: Oncology

## 2012-03-24 NOTE — Telephone Encounter (Signed)
Mailed the pt her jan-nov 2014 appt calendar. Could not go farther due to the templates have not been built that far out for 2015

## 2012-05-15 ENCOUNTER — Other Ambulatory Visit (HOSPITAL_BASED_OUTPATIENT_CLINIC_OR_DEPARTMENT_OTHER): Payer: Medicare Other | Admitting: Lab

## 2012-05-15 ENCOUNTER — Ambulatory Visit (HOSPITAL_BASED_OUTPATIENT_CLINIC_OR_DEPARTMENT_OTHER): Payer: Medicare Other

## 2012-05-15 VITALS — BP 155/60 | HR 91 | Temp 98.2°F

## 2012-05-15 DIAGNOSIS — D649 Anemia, unspecified: Secondary | ICD-10-CM

## 2012-05-15 LAB — CBC WITH DIFFERENTIAL/PLATELET
Basophils Absolute: 0 10*3/uL (ref 0.0–0.1)
EOS%: 4.3 % (ref 0.0–7.0)
HCT: 31.7 % — ABNORMAL LOW (ref 34.8–46.6)
HGB: 9.9 g/dL — ABNORMAL LOW (ref 11.6–15.9)
MCH: 24.1 pg — ABNORMAL LOW (ref 25.1–34.0)
MCV: 77.4 fL — ABNORMAL LOW (ref 79.5–101.0)
NEUT%: 61.7 % (ref 38.4–76.8)
Platelets: 192 10*3/uL (ref 145–400)
lymph#: 1.7 10*3/uL (ref 0.9–3.3)

## 2012-05-15 MED ORDER — DARBEPOETIN ALFA-POLYSORBATE 300 MCG/0.6ML IJ SOLN
300.0000 ug | Freq: Once | INTRAMUSCULAR | Status: AC
  Administered 2012-05-15: 300 ug via SUBCUTANEOUS
  Filled 2012-05-15: qty 0.6

## 2012-06-01 ENCOUNTER — Other Ambulatory Visit: Payer: Self-pay | Admitting: Family Medicine

## 2012-07-01 ENCOUNTER — Other Ambulatory Visit: Payer: Self-pay | Admitting: Family Medicine

## 2012-07-17 ENCOUNTER — Other Ambulatory Visit (HOSPITAL_BASED_OUTPATIENT_CLINIC_OR_DEPARTMENT_OTHER): Payer: Medicare Other | Admitting: Lab

## 2012-07-17 ENCOUNTER — Ambulatory Visit (HOSPITAL_BASED_OUTPATIENT_CLINIC_OR_DEPARTMENT_OTHER): Payer: Medicare Other

## 2012-07-17 VITALS — BP 157/56 | HR 94 | Temp 99.1°F

## 2012-07-17 DIAGNOSIS — D649 Anemia, unspecified: Secondary | ICD-10-CM

## 2012-07-17 LAB — CBC WITH DIFFERENTIAL/PLATELET
Basophils Absolute: 0 10*3/uL (ref 0.0–0.1)
EOS%: 5.8 % (ref 0.0–7.0)
HCT: 34.4 % — ABNORMAL LOW (ref 34.8–46.6)
HGB: 10.6 g/dL — ABNORMAL LOW (ref 11.6–15.9)
LYMPH%: 31.3 % (ref 14.0–49.7)
MCH: 23.8 pg — ABNORMAL LOW (ref 25.1–34.0)
MCHC: 30.8 g/dL — ABNORMAL LOW (ref 31.5–36.0)
MCV: 77.3 fL — ABNORMAL LOW (ref 79.5–101.0)
MONO%: 9.2 % (ref 0.0–14.0)
NEUT%: 53.3 % (ref 38.4–76.8)
Platelets: 206 10*3/uL (ref 145–400)
lymph#: 2.2 10*3/uL (ref 0.9–3.3)

## 2012-07-17 MED ORDER — DARBEPOETIN ALFA-POLYSORBATE 500 MCG/ML IJ SOLN
300.0000 ug | Freq: Once | INTRAMUSCULAR | Status: AC
  Administered 2012-07-17: 300 ug via SUBCUTANEOUS
  Filled 2012-07-17: qty 1

## 2012-07-17 NOTE — Patient Instructions (Signed)
Darbepoetin Alfa injection What is this medicine? DARBEPOETIN ALFA (dar be POE e tin AL fa) helps your body make more red blood cells. It is used to treat anemia caused by chronic kidney failure and chemotherapy. This medicine may be used for other purposes; ask your health care provider or pharmacist if you have questions. What should I tell my health care provider before I take this medicine? They need to know if you have any of these conditions: -blood clotting disorders or history of blood clots -cancer patient not on chemotherapy -cystic fibrosis -heart disease, such as angina, heart failure, or a history of a heart attack -hemoglobin level of 12 g/dL or greater -high blood pressure -low levels of folate, iron, or vitamin B12 -seizures -an unusual or allergic reaction to darbepoetin, erythropoietin, albumin, hamster proteins, latex, other medicines, foods, dyes, or preservatives -pregnant or trying to get pregnant -breast-feeding How should I use this medicine? This medicine is for injection into a vein or under the skin. It is usually given by a health care professional in a hospital or clinic setting. If you get this medicine at home, you will be taught how to prepare and give this medicine. Do not shake the solution before you withdraw a dose. Use exactly as directed. Take your medicine at regular intervals. Do not take your medicine more often than directed. It is important that you put your used needles and syringes in a special sharps container. Do not put them in a trash can. If you do not have a sharps container, call your pharmacist or healthcare provider to get one. Talk to your pediatrician regarding the use of this medicine in children. While this medicine may be used in children as young as 1 year for selected conditions, precautions do apply. Overdosage: If you think you have taken too much of this medicine contact a poison control center or emergency room at once. NOTE:  This medicine is only for you. Do not share this medicine with others. What if I miss a dose? If you miss a dose, take it as soon as you can. If it is almost time for your next dose, take only that dose. Do not take double or extra doses. What may interact with this medicine? Do not take this medicine with any of the following medications: -epoetin alfa This list may not describe all possible interactions. Give your health care provider a list of all the medicines, herbs, non-prescription drugs, or dietary supplements you use. Also tell them if you smoke, drink alcohol, or use illegal drugs. Some items may interact with your medicine. What should I watch for while using this medicine? Visit your prescriber or health care professional for regular checks on your progress and for the needed blood tests and blood pressure measurements. It is especially important for the doctor to make sure your hemoglobin level is in the desired range, to limit the risk of potential side effects and to give you the best benefit. Keep all appointments for any recommended tests. Check your blood pressure as directed. Ask your doctor what your blood pressure should be and when you should contact him or her. As your body makes more red blood cells, you may need to take iron, folic acid, or vitamin B supplements. Ask your doctor or health care provider which products are right for you. If you have kidney disease continue dietary restrictions, even though this medication can make you feel better. Talk with your doctor or health care professional about the   foods you eat and the vitamins that you take. What side effects may I notice from receiving this medicine? Side effects that you should report to your doctor or health care professional as soon as possible: -allergic reactions like skin rash, itching or hives, swelling of the face, lips, or tongue -breathing problems -changes in vision -chest pain -confusion, trouble speaking  or understanding -feeling faint or lightheaded, falls -high blood pressure -muscle aches or pains -pain, swelling, warmth in the leg -rapid weight gain -severe headaches -sudden numbness or weakness of the face, arm or leg -trouble walking, dizziness, loss of balance or coordination -seizures (convulsions) -swelling of the ankles, feet, hands -unusually weak or tired Side effects that usually do not require medical attention (report to your doctor or health care professional if they continue or are bothersome): -diarrhea -fever, chills (flu-like symptoms) -headaches -nausea, vomiting -redness, stinging, or swelling at site where injected This list may not describe all possible side effects. Call your doctor for medical advice about side effects. You may report side effects to FDA at 1-800-FDA-1088. Where should I keep my medicine? Keep out of the reach of children. Store in a refrigerator between 2 and 8 degrees C (36 and 46 degrees F). Do not freeze. Do not shake. Throw away any unused portion if using a single-dose vial. Throw away any unused medicine after the expiration date. NOTE: This sheet is a summary. It may not cover all possible information. If you have questions about this medicine, talk to your doctor, pharmacist, or health care provider.  2013, Elsevier/Gold Standard. (04/05/2008 10:23:57 AM)  

## 2012-07-31 ENCOUNTER — Other Ambulatory Visit: Payer: Self-pay | Admitting: Family Medicine

## 2012-08-07 ENCOUNTER — Telehealth: Payer: Self-pay | Admitting: Family Medicine

## 2012-08-07 MED ORDER — AMLODIPINE BESYLATE 5 MG PO TABS: ORAL_TABLET | ORAL | Status: DC

## 2012-08-07 NOTE — Telephone Encounter (Signed)
PT IS COMING IN FOR A VISIT ON Tuesday NEEDS REFILL ON NORVASC SENT TO [facility], PT IS COMPLETELY OUT.

## 2012-08-07 NOTE — Telephone Encounter (Signed)
SENT IN [facility]

## 2012-08-11 ENCOUNTER — Encounter: Payer: Self-pay | Admitting: Family Medicine

## 2012-08-11 ENCOUNTER — Ambulatory Visit (INDEPENDENT_AMBULATORY_CARE_PROVIDER_SITE_OTHER): Payer: Medicare Other | Admitting: Family Medicine

## 2012-08-11 VITALS — BP 160/70 | HR 116 | Wt 103.0 lb

## 2012-08-11 DIAGNOSIS — E785 Hyperlipidemia, unspecified: Secondary | ICD-10-CM

## 2012-08-11 DIAGNOSIS — K219 Gastro-esophageal reflux disease without esophagitis: Secondary | ICD-10-CM

## 2012-08-11 DIAGNOSIS — I152 Hypertension secondary to endocrine disorders: Secondary | ICD-10-CM

## 2012-08-11 DIAGNOSIS — Z79899 Other long term (current) drug therapy: Secondary | ICD-10-CM

## 2012-08-11 DIAGNOSIS — E119 Type 2 diabetes mellitus without complications: Secondary | ICD-10-CM

## 2012-08-11 DIAGNOSIS — I10 Essential (primary) hypertension: Secondary | ICD-10-CM

## 2012-08-11 DIAGNOSIS — D649 Anemia, unspecified: Secondary | ICD-10-CM

## 2012-08-11 DIAGNOSIS — E1169 Type 2 diabetes mellitus with other specified complication: Secondary | ICD-10-CM

## 2012-08-11 LAB — POCT GLYCOSYLATED HEMOGLOBIN (HGB A1C): Hemoglobin A1C: 6.4

## 2012-08-11 MED ORDER — AMLODIPINE BESYLATE 5 MG PO TABS: ORAL_TABLET | ORAL | Status: DC

## 2012-08-11 MED ORDER — METOPROLOL SUCCINATE ER 100 MG PO TB24: ORAL_TABLET | ORAL | Status: DC

## 2012-08-12 ENCOUNTER — Encounter: Payer: Self-pay | Admitting: Family Medicine

## 2012-08-12 NOTE — Progress Notes (Signed)
  Subjective:    Patient ID: Chelsea Zuniga, female    DOB: 12-Mar-1922, 79 y.o.   MRN: [id number]  HPI She is here for a routine checkup. Her main complaint today is decreased hearing. She does complain of some slight anorexia. Review of her record indicates that her weight does fluctuate and she has actually been lower in the past. Lab data was also reviewed. She lives with her daughter. Social history also reviewed. She does not smoke or drink. Her physical activity is quite minimal. She does get regular eye checks.   Review of Systems     Objective:   Physical Exam Alert and in no distress. Exam of both ears does show cerumen present. He was lavaged however not all of the cerumen could be removed. Hemoglobin A1c is 6.4. Her hemoglobin does seem to be fairly stable.       Assessment & Plan:  Diabetes mellitus - Plan: POCT glycosylated hemoglobin (Hb A1C)  Anemia  Hypertension associated with diabetes - Plan: metoprolol succinate (TOPROL-XL) 100 MG 24 hr tablet, amLODipine (NORVASC) 5 MG tablet  Hyperlipidemia LDL goal <70  GERD (gastroesophageal reflux disease)  Encounter for long-term (current) use of other medications continue present medications. Discussed the weight loss with her daughter and at this point we will monitor the situation. They are comfortable with this approach.

## 2012-09-08 ENCOUNTER — Other Ambulatory Visit: Payer: Self-pay | Admitting: Oncology

## 2012-09-11 ENCOUNTER — Other Ambulatory Visit (HOSPITAL_BASED_OUTPATIENT_CLINIC_OR_DEPARTMENT_OTHER): Payer: Medicare Other | Admitting: Lab

## 2012-09-11 ENCOUNTER — Ambulatory Visit (HOSPITAL_BASED_OUTPATIENT_CLINIC_OR_DEPARTMENT_OTHER): Payer: Medicare Other

## 2012-09-11 VITALS — BP 152/54 | HR 92 | Temp 98.8°F

## 2012-09-11 DIAGNOSIS — D649 Anemia, unspecified: Secondary | ICD-10-CM

## 2012-09-11 LAB — CBC WITH DIFFERENTIAL/PLATELET
Basophils Absolute: 0.1 10*3/uL (ref 0.0–0.1)
EOS%: 5.8 % (ref 0.0–7.0)
HCT: 33.1 % — ABNORMAL LOW (ref 34.8–46.6)
HGB: 10.1 g/dL — ABNORMAL LOW (ref 11.6–15.9)
LYMPH%: 32.6 % (ref 14.0–49.7)
MCH: 23.8 pg — ABNORMAL LOW (ref 25.1–34.0)
MONO#: 0.7 10*3/uL (ref 0.1–0.9)
NEUT%: 52.2 % (ref 38.4–76.8)
Platelets: 189 10*3/uL (ref 145–400)
lymph#: 2.5 10*3/uL (ref 0.9–3.3)

## 2012-09-11 MED ORDER — DARBEPOETIN ALFA-POLYSORBATE 200 MCG/0.4ML IJ SOLN
200.0000 ug | Freq: Once | INTRAMUSCULAR | Status: AC
  Administered 2012-09-11: 200 ug via SUBCUTANEOUS
  Filled 2012-09-11: qty 0.4

## 2012-09-16 ENCOUNTER — Encounter: Payer: Self-pay | Admitting: Internal Medicine

## 2012-11-03 ENCOUNTER — Other Ambulatory Visit: Payer: Self-pay | Admitting: Family Medicine

## 2012-11-13 ENCOUNTER — Telehealth: Payer: Self-pay | Admitting: *Deleted

## 2012-11-13 ENCOUNTER — Other Ambulatory Visit: Payer: Medicare Other | Admitting: Lab

## 2012-11-13 ENCOUNTER — Ambulatory Visit: Payer: Medicare Other

## 2012-11-13 NOTE — Telephone Encounter (Signed)
Called cell number about missed appointments.  Left message to call back to reschedule.

## 2012-11-16 ENCOUNTER — Telehealth: Payer: Self-pay | Admitting: *Deleted

## 2012-11-16 NOTE — Telephone Encounter (Signed)
Pt called to r/s her lab and inj appt. gv appt for 11/18/12 labs @ 3:45 and inj @ 4:15pm. Pt is aware...td

## 2012-11-18 ENCOUNTER — Ambulatory Visit (HOSPITAL_BASED_OUTPATIENT_CLINIC_OR_DEPARTMENT_OTHER): Payer: Medicare Other

## 2012-11-18 ENCOUNTER — Other Ambulatory Visit (HOSPITAL_BASED_OUTPATIENT_CLINIC_OR_DEPARTMENT_OTHER): Payer: Medicare Other | Admitting: Lab

## 2012-11-18 VITALS — BP 178/55 | HR 100 | Temp 98.0°F

## 2012-11-18 DIAGNOSIS — D649 Anemia, unspecified: Secondary | ICD-10-CM

## 2012-11-18 LAB — CBC WITH DIFFERENTIAL/PLATELET
Basophils Absolute: 0.1 10*3/uL (ref 0.0–0.1)
EOS%: 1.8 % (ref 0.0–7.0)
HGB: 10.3 g/dL — ABNORMAL LOW (ref 11.6–15.9)
LYMPH%: 28.6 % (ref 14.0–49.7)
MCH: 24.3 pg — ABNORMAL LOW (ref 25.1–34.0)
MCV: 77.7 fL — ABNORMAL LOW (ref 79.5–101.0)
MONO%: 11 % (ref 0.0–14.0)
Platelets: 211 10*3/uL (ref 145–400)
RBC: 4.26 10*6/uL (ref 3.70–5.45)
RDW: 14.6 % — ABNORMAL HIGH (ref 11.2–14.5)

## 2012-11-18 MED ORDER — DARBEPOETIN ALFA-POLYSORBATE 200 MCG/0.4ML IJ SOLN
200.0000 ug | Freq: Once | INTRAMUSCULAR | Status: AC
  Administered 2012-11-18: 200 ug via SUBCUTANEOUS
  Filled 2012-11-18: qty 0.4

## 2012-11-27 ENCOUNTER — Encounter: Payer: Self-pay | Admitting: Internal Medicine

## 2012-12-09 ENCOUNTER — Encounter: Payer: Self-pay | Admitting: Family Medicine

## 2012-12-09 ENCOUNTER — Ambulatory Visit (INDEPENDENT_AMBULATORY_CARE_PROVIDER_SITE_OTHER): Payer: Medicare Other | Admitting: Family Medicine

## 2012-12-09 VITALS — BP 140/70 | HR 103 | Wt 103.0 lb

## 2012-12-09 DIAGNOSIS — E1159 Type 2 diabetes mellitus with other circulatory complications: Secondary | ICD-10-CM

## 2012-12-09 DIAGNOSIS — I10 Essential (primary) hypertension: Secondary | ICD-10-CM

## 2012-12-09 DIAGNOSIS — E785 Hyperlipidemia, unspecified: Secondary | ICD-10-CM

## 2012-12-09 DIAGNOSIS — K219 Gastro-esophageal reflux disease without esophagitis: Secondary | ICD-10-CM

## 2012-12-09 DIAGNOSIS — E1169 Type 2 diabetes mellitus with other specified complication: Secondary | ICD-10-CM

## 2012-12-09 DIAGNOSIS — E119 Type 2 diabetes mellitus without complications: Secondary | ICD-10-CM

## 2012-12-09 LAB — POCT GLYCOSYLATED HEMOGLOBIN (HGB A1C): Hemoglobin A1C: 6.3

## 2012-12-09 NOTE — Progress Notes (Signed)
  Subjective:    Chelsea Zuniga is a 79 y.o. female who presents for follow-up of Type 2 diabetes mellitus.    Home blood sugar records HIGH 200 LOW 119  Current symptoms/problems NONE Daily foot checks, foot concerns: YES /FEET AND LEG SWELLING Last eye exam:  09/16/12    Medication compliance: Current diet: BAKED DIET Current exercise:WALKING Known diabetic complications: none Cardiovascular risk factors: advanced age (older than 56 for men, 106 for women), diabetes mellitus, dyslipidemia, hypertension and sedentary lifestyle She has had some slight difficulty with headache that seems to be mainly on the right side but very intermittent in nature. It usually occurs only at night and it does respond well to 2 Advil.  The following portions of the patient's history were reviewed and updated as appropriate: allergies, current medications, past family history, past medical history, past social history and problem list.  ROS as in subjective above    Objective:     General appearence: alert, no distress, WD/WN Neck: supple, no lymphadenopathy, no thyromegaly, no masses Heart: RRR, normal S1, S2, no murmurs Lungs: CTA bilaterally, no wheezes, rhonchi, or rales Abdomen: +bs, soft, non tender, non distended, no masses, no hepatomegaly, no splenomegaly Pulses: 2+ symmetric, upper and lower extremities, normal cap refill Ext: no edema  Lab Review Lab Results  Component Value Date   HGBA1C 6.4 08/11/2012   Lab Results  Component Value Date   CHOL 196 11/12/2011   HDL 68 11/12/2011   LDLCALC 213* 11/12/2011   TRIG 73 11/12/2011   CHOLHDL 2.9 11/12/2011   No results found for this basenameConcepcion Zuniga     Chemistry      Component Value Date/Time   NA 138 11/12/2011 1613   K 4.3 11/12/2011 1613   CL 102 11/12/2011 1613   CO2 28 11/12/2011 1613   BUN 16 11/12/2011 1613   CREATININE 0.87 11/12/2011 1613   CREATININE 0.78 01/14/2006 1515      Component Value Date/Time   CALCIUM 9.3  11/12/2011 1613   ALKPHOS 43 11/12/2011 1613   AST 14 11/12/2011 1613   ALT 9 11/12/2011 1613   BILITOT 0.3 11/12/2011 1613        Chemistry      Component Value Date/Time   NA 138 11/12/2011 1613   K 4.3 11/12/2011 1613   CL 102 11/12/2011 1613   CO2 28 11/12/2011 1613   BUN 16 11/12/2011 1613   CREATININE 0.87 11/12/2011 1613   CREATININE 0.78 01/14/2006 1515      Component Value Date/Time   CALCIUM 9.3 11/12/2011 1613   ALKPHOS 43 11/12/2011 1613   AST 14 11/12/2011 1613   ALT 9 11/12/2011 1613   BILITOT 0.3 11/12/2011 1613         Assessment:   Encounter Diagnoses  Name Primary?  . Diabetes mellitus Yes  . GERD (gastroesophageal reflux disease)   . Hyperlipidemia LDL goal <70   . Hypertension associated with diabetes    headache. Hemoglobin A1c 6.3      Plan:    1.  Rx changes: none 2.  Education: Reviewed 'ABCs' of diabetes management (respective goals in parentheses):  A1C (<7), blood pressure (<130/80), and cholesterol (LDL <100). 3.  Compliance at present is estimated to be good. Efforts to improve compliance (if necessary) will be directed at none.good 4. Follow up: 4 months  Continue to use Advil on an as-needed basis for headaches.

## 2012-12-11 ENCOUNTER — Ambulatory Visit: Payer: Medicare Other | Admitting: Family Medicine

## 2012-12-29 ENCOUNTER — Other Ambulatory Visit: Payer: Self-pay | Admitting: Family Medicine

## 2013-01-15 ENCOUNTER — Other Ambulatory Visit (HOSPITAL_BASED_OUTPATIENT_CLINIC_OR_DEPARTMENT_OTHER): Payer: Medicare Other | Admitting: Lab

## 2013-01-15 ENCOUNTER — Ambulatory Visit (HOSPITAL_BASED_OUTPATIENT_CLINIC_OR_DEPARTMENT_OTHER): Payer: Medicare Other

## 2013-01-15 VITALS — BP 167/48 | HR 83 | Temp 98.3°F

## 2013-01-15 DIAGNOSIS — D649 Anemia, unspecified: Secondary | ICD-10-CM

## 2013-01-15 LAB — CBC WITH DIFFERENTIAL/PLATELET
BASO%: 0.5 % (ref 0.0–2.0)
Basophils Absolute: 0 10*3/uL (ref 0.0–0.1)
EOS%: 3.1 % (ref 0.0–7.0)
HCT: 32.7 % — ABNORMAL LOW (ref 34.8–46.6)
HGB: 10.2 g/dL — ABNORMAL LOW (ref 11.6–15.9)
LYMPH%: 26.1 % (ref 14.0–49.7)
MCH: 24.3 pg — ABNORMAL LOW (ref 25.1–34.0)
MCHC: 31.1 g/dL — ABNORMAL LOW (ref 31.5–36.0)
MCV: 78.1 fL — ABNORMAL LOW (ref 79.5–101.0)
MONO%: 11.3 % (ref 0.0–14.0)
NEUT%: 59 % (ref 38.4–76.8)
Platelets: 171 10*3/uL (ref 145–400)
lymph#: 1.9 10*3/uL (ref 0.9–3.3)

## 2013-01-15 MED ORDER — DARBEPOETIN ALFA-POLYSORBATE 200 MCG/0.4ML IJ SOLN
200.0000 ug | Freq: Once | INTRAMUSCULAR | Status: AC
  Administered 2013-01-15: 200 ug via SUBCUTANEOUS
  Filled 2013-01-15: qty 0.4

## 2013-03-19 ENCOUNTER — Ambulatory Visit (HOSPITAL_BASED_OUTPATIENT_CLINIC_OR_DEPARTMENT_OTHER): Payer: Medicare Other

## 2013-03-19 ENCOUNTER — Other Ambulatory Visit (HOSPITAL_BASED_OUTPATIENT_CLINIC_OR_DEPARTMENT_OTHER): Payer: Medicare Other | Admitting: Lab

## 2013-03-19 VITALS — BP 159/72 | HR 90 | Temp 96.8°F | Resp 18

## 2013-03-19 DIAGNOSIS — D649 Anemia, unspecified: Secondary | ICD-10-CM

## 2013-03-19 LAB — CBC WITH DIFFERENTIAL/PLATELET
Basophils Absolute: 0.1 10*3/uL (ref 0.0–0.1)
Eosinophils Absolute: 0.2 10*3/uL (ref 0.0–0.5)
HCT: 32.6 % — ABNORMAL LOW (ref 34.8–46.6)
HGB: 9.9 g/dL — ABNORMAL LOW (ref 11.6–15.9)
MCH: 23.6 pg — ABNORMAL LOW (ref 25.1–34.0)
MCV: 77.4 fL — ABNORMAL LOW (ref 79.5–101.0)
MONO%: 9.4 % (ref 0.0–14.0)
NEUT#: 4.5 10*3/uL (ref 1.5–6.5)
NEUT%: 66 % (ref 38.4–76.8)
Platelets: 226 10*3/uL (ref 145–400)
RDW: 14.9 % — ABNORMAL HIGH (ref 11.2–14.5)

## 2013-03-19 MED ORDER — DARBEPOETIN ALFA-POLYSORBATE 200 MCG/0.4ML IJ SOLN
200.0000 ug | Freq: Once | INTRAMUSCULAR | Status: AC
  Administered 2013-03-19: 200 ug via SUBCUTANEOUS
  Filled 2013-03-19: qty 0.4

## 2013-03-19 NOTE — Patient Instructions (Signed)
Darbepoetin Alfa injection What is this medicine? DARBEPOETIN ALFA (dar be POE e tin AL fa) helps your body make more red blood cells. It is used to treat anemia caused by chronic kidney failure and chemotherapy. This medicine may be used for other purposes; ask your health care provider or pharmacist if you have questions. COMMON BRAND NAME(S): Aranesp What should I tell my health care provider before I take this medicine? They need to know if you have any of these conditions: -blood clotting disorders or history of blood clots -cancer patient not on chemotherapy -cystic fibrosis -heart disease, such as angina, heart failure, or a history of a heart attack -hemoglobin level of 12 g/dL or greater -high blood pressure -low levels of folate, iron, or vitamin B12 -seizures -an unusual or allergic reaction to darbepoetin, erythropoietin, albumin, hamster proteins, latex, other medicines, foods, dyes, or preservatives -pregnant or trying to get pregnant -breast-feeding How should I use this medicine? This medicine is for injection into a vein or under the skin. It is usually given by a health care professional in a hospital or clinic setting. If you get this medicine at home, you will be taught how to prepare and give this medicine. Do not shake the solution before you withdraw a dose. Use exactly as directed. Take your medicine at regular intervals. Do not take your medicine more often than directed. It is important that you put your used needles and syringes in a special sharps container. Do not put them in a trash can. If you do not have a sharps container, call your pharmacist or healthcare provider to get one. Talk to your pediatrician regarding the use of this medicine in children. While this medicine may be used in children as young as 1 year for selected conditions, precautions do apply. Overdosage: If you think you have taken too much of this medicine contact a poison control center or  emergency room at once. NOTE: This medicine is only for you. Do not share this medicine with others. What if I miss a dose? If you miss a dose, take it as soon as you can. If it is almost time for your next dose, take only that dose. Do not take double or extra doses. What may interact with this medicine? Do not take this medicine with any of the following medications: -epoetin alfa This list may not describe all possible interactions. Give your health care provider a list of all the medicines, herbs, non-prescription drugs, or dietary supplements you use. Also tell them if you smoke, drink alcohol, or use illegal drugs. Some items may interact with your medicine. What should I watch for while using this medicine? Visit your prescriber or health care professional for regular checks on your progress and for the needed blood tests and blood pressure measurements. It is especially important for the doctor to make sure your hemoglobin level is in the desired range, to limit the risk of potential side effects and to give you the best benefit. Keep all appointments for any recommended tests. Check your blood pressure as directed. Ask your doctor what your blood pressure should be and when you should contact him or her. As your body makes more red blood cells, you may need to take iron, folic acid, or vitamin B supplements. Ask your doctor or health care provider which products are right for you. If you have kidney disease continue dietary restrictions, even though this medication can make you feel better. Talk with your doctor or health   care professional about the foods you eat and the vitamins that you take. What side effects may I notice from receiving this medicine? Side effects that you should report to your doctor or health care professional as soon as possible: -allergic reactions like skin rash, itching or hives, swelling of the face, lips, or tongue -breathing problems -changes in vision -chest  pain -confusion, trouble speaking or understanding -feeling faint or lightheaded, falls -high blood pressure -muscle aches or pains -pain, swelling, warmth in the leg -rapid weight gain -severe headaches -sudden numbness or weakness of the face, arm or leg -trouble walking, dizziness, loss of balance or coordination -seizures (convulsions) -swelling of the ankles, feet, hands -unusually weak or tired Side effects that usually do not require medical attention (report to your doctor or health care professional if they continue or are bothersome): -diarrhea -fever, chills (flu-like symptoms) -headaches -nausea, vomiting -redness, stinging, or swelling at site where injected This list may not describe all possible side effects. Call your doctor for medical advice about side effects. You may report side effects to FDA at 1-800-FDA-1088. Where should I keep my medicine? Keep out of the reach of children. Store in a refrigerator between 2 and 8 degrees C (36 and 46 degrees F). Do not freeze. Do not shake. Throw away any unused portion if using a single-dose vial. Throw away any unused medicine after the expiration date. NOTE: This sheet is a summary. It may not cover all possible information. If you have questions about this medicine, talk to your doctor, pharmacist, or health care provider.  2014, Elsevier/Gold Standard. (2008-04-05 10:23:57)  

## 2013-03-25 ENCOUNTER — Ambulatory Visit (HOSPITAL_BASED_OUTPATIENT_CLINIC_OR_DEPARTMENT_OTHER): Payer: Medicare Other | Admitting: Oncology

## 2013-03-25 ENCOUNTER — Telehealth: Payer: Self-pay | Admitting: Oncology

## 2013-03-25 VITALS — BP 176/65 | HR 89 | Temp 97.7°F | Resp 18 | Ht 59.5 in | Wt 108.8 lb

## 2013-03-25 DIAGNOSIS — D649 Anemia, unspecified: Secondary | ICD-10-CM

## 2013-03-25 NOTE — Progress Notes (Signed)
ID: Chelsea Zuniga   DOB: 28-Nov-1921  MR#: [id number]  XBJ#:[id number]  PCP: Carollee Herter, MD GYN:  SU:  OTHER MD:   HISTORY OF PRESENT ILLNESS: Chelsea Zuniga is a 79 year old,  woman referred by Dr. Susann Givens for evaluation of anemia.  We have hemoglobin levels dating back to October 2004, when the hemoglobin level was 11.6, with an MCV of 76.  The white cell count was 12.8, platelets 358,000.  Serum creatinine 0.7, and liver function tests were normal.  The total protein was slightly elevated at 8.6, with an albumin of 4.6.    In March 2005, the patient's hemoglobin was low, but I cannot read it in this faxed copy.  On 01/11/04, the hemoglobin was 10.4, with an MCV of 77.  The white cell count was 9.7, and the platelets 292,000.  On the same day, Dr. Susann Givens obtained a ferritin which was 57, with an iron saturation of 17% (the patient is on iron supplementation orally). Her subsequent history is as detailed below  INTERVAL HISTORY: Chelsea Zuniga returns today with her daughter Chelsea Zuniga for followup of Chelsea Zuniga anemia. The interval history is unremarkable. In particular of the patient's reports no problems from the darbepoetin shots  REVIEW OF SYSTEMS: She denies pain. She is a little bit constipated and today we talked about how important it is to avoid hard stools. I suggested she take 1 or 2 stool softeners daily. She uses air seed/walker when out of the house. There have been no falls. She denies dizziness or gait imbalance. She has already had her flu shots. A detailed review of systems today was otherwise entirely stable  PAST MEDICAL HISTORY: Past Medical History  Diagnosis Date  . Diabetes mellitus   . Hypertension   . Dyslipidemia   . Arthritis   . Diverticulosis   . Hemorrhoids   . Personal history of colonic polyps   . Aortic stenosis   . Anemia   . GERD (gastroesophageal reflux disease)   . Bowen's disease   Significant for  history of colonic polyps,  history of hemorrhoids, history of diverticular disease, history of diabetes mellitus with retinopathy and nephropathy, history of GERD, history of hypertension, history of hypercholesterolemia, history of osteoarthritis, and she is status post cataract surgery and status post total vaginal hysterectomy.    PAST SURGICAL HISTORY: Past Surgical History  Procedure Laterality Date  . Abdominal hysterectomy  1997  . Small intestine surgery  1999    FAMILY HISTORY No family history on file. The patient's father died from "old age."  Her mother died from coronary artery disease.    GYNECOLOGIC HISTORY: She is G2, P2.     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  She has been a widow since 66 when her husband died from a heart attack.  The patient lives with her daughter, Chelsea Zuniga, Madie's daughter, Chelsea Zuniga, and Carla's son, the patient's Chelsea Zuniga.  Madie's brother, the patient's other child, is Chelsea Zuniga, and he lives in [place].  The patient is a Control and instrumentation engineer.     ADVANCED DIRECTIVES:  HEALTH MAINTENANCE: History  Substance Use Topics  . Smoking status: Never Smoker   . Smokeless tobacco: Never Used  . Alcohol Use: Not on file     Colonoscopy:  PAP:  Bone density:  Lipid panel:  No Known Allergies  Current Outpatient Prescriptions  Medication Sig Dispense Refill  . amLODipine (NORVASC) 5 MG tablet take 1 tablet by mouth once  daily  30 tablet  11  . aspirin 81 MG tablet Take 81 mg by mouth daily.       . cloNIDine (CATAPRES) 0.1 MG tablet take 1 tablet by mouth twice a day  60 tablet  PRN  . ferrous fumarate (HEMOCYTE - 106 MG FE) 325 (106 FE) MG TABS Take 1 tablet by mouth.        Marland Kitchen lisinopril-hydrochlorothiazide (PRINZIDE,ZESTORETIC) 20-12.5 MG per tablet take 1 tablet by mouth once daily  30 tablet  PRN  . metoprolol succinate (TOPROL-XL) 100 MG 24 hr tablet take 1 tablet by mouth once daily  30 tablet  PRN  .  pioglitazone-metformin (ACTOPLUS MET) 15-850 MG per tablet take 1 tablet by mouth twice a day with meals  60 tablet  2  . simvastatin (ZOCOR) 80 MG tablet TAKE 1 TABLET BY MOUTH AT BEDTIME  30 tablet  11   No current facility-administered medications for this visit.    OBJECTIVE: Elderly African American woman who appears stated age 59 Vitals:   03/25/13 1402  BP: 176/65  Pulse: 89  Temp: 97.7 F (36.5 C)  Resp: 18     Body mass index is 21.62 kg/(m^2).    ECOG FS:2  Sclerae unicteric, bilateral arcus at [facility] clear and moist No cervical or supraclavicular adenopathy Lungs no rales or rhonchi Heart regular rate and rhythm Abd soft, nontender, positive bowel sounds MSK kyphosis but no focal spinal tenderness Neuro: nonfocal, pleasant affect Breasts: Deferred   LAB RESULTS: Results for DAI, APEL (MRN [id number]) as of 03/25/2013 14:00  Ref. Range 07/17/2012 15:26 09/11/2012 15:18 11/18/2012 15:25 01/15/2013 15:18 03/19/2013 15:14  Ferritin Latest Range: 10-291 ng/mL 200 159 156 120 123    Lab Results  Component Value Date   WBC 6.8 03/19/2013   NEUTROABS 4.5 03/19/2013   HGB 9.9* 03/19/2013   HCT 32.6* 03/19/2013   MCV 77.4* 03/19/2013   PLT 226 03/19/2013      Chemistry      Component Value Date/Time   NA 138 11/12/2011 1613   K 4.3 11/12/2011 1613   CL 102 11/12/2011 1613   CO2 28 11/12/2011 1613   BUN 16 11/12/2011 1613   CREATININE 0.87 11/12/2011 1613   CREATININE 0.78 01/14/2006 1515      Component Value Date/Time   CALCIUM 9.3 11/12/2011 1613   ALKPHOS 43 11/12/2011 1613   AST 14 11/12/2011 1613   ALT 9 11/12/2011 1613   BILITOT 0.3 11/12/2011 1613       No results found for this basename: LABCA2    No components found with this basename: LABCA125    No results found for this basename: INR,  in the last 168 hours  Urinalysis No results found for this basename: colorurine,  appearanceur,  labspec,  phurine,  glucoseu,  hgbur,  bilirubinur,   ketonesur,  proteinur,  urobilinogen,  nitrite,  leukocytesur    STUDIES: No results found.  ASSESSMENT: 79 y.o. [place] woman with a history of chronic anemia responding to minimal doses of aranesp  PLAN: The current protocol is keeping her hemoglobin at around 10, and a ferritin in about 100. It may be that she will need an iron infusion sometime in the next year to help the darbepoetin port a little bit better. If there is any further drop in her hemoglobin we will certainly consider that.  Otherwise the plan is to continue Aranesp every 2 months, with visits once a year. We will check her hemoglobin  and ferritin at each lab visit. Today I also encouraged her to make sure to avoid either diarrhea or very hard stools, and to be careful regarding falls. She knows to call for any problems that may develop before next visit here.   Aleksander Edmiston C    03/25/2013

## 2013-03-25 NOTE — Telephone Encounter (Signed)
, °

## 2013-03-28 ENCOUNTER — Other Ambulatory Visit: Payer: Self-pay | Admitting: Family Medicine

## 2013-04-05 ENCOUNTER — Other Ambulatory Visit: Payer: Self-pay

## 2013-04-05 MED ORDER — PIOGLITAZONE HCL-METFORMIN HCL 15-850 MG PO TABS: ORAL_TABLET | ORAL | Status: DC

## 2013-04-05 NOTE — Telephone Encounter (Signed)
Sent in actos per fax

## 2013-05-03 ENCOUNTER — Other Ambulatory Visit: Payer: Self-pay

## 2013-05-03 ENCOUNTER — Telehealth: Payer: Self-pay | Admitting: Family Medicine

## 2013-05-03 MED ORDER — PIOGLITAZONE HCL-METFORMIN HCL 15-850 MG PO TABS: ORAL_TABLET | ORAL | Status: DC

## 2013-05-03 NOTE — Telephone Encounter (Signed)
SENT IN REFILL TO [facility] [place]

## 2013-05-03 NOTE — Telephone Encounter (Signed)
DONE

## 2013-05-03 NOTE — Telephone Encounter (Signed)
Please refill pt's metformin to [facility]. PT NOW USES [facility] [address]

## 2013-05-14 ENCOUNTER — Other Ambulatory Visit: Payer: Medicare Other | Admitting: Lab

## 2013-05-14 ENCOUNTER — Other Ambulatory Visit (HOSPITAL_BASED_OUTPATIENT_CLINIC_OR_DEPARTMENT_OTHER): Payer: Medicare Other

## 2013-05-14 ENCOUNTER — Encounter (INDEPENDENT_AMBULATORY_CARE_PROVIDER_SITE_OTHER): Payer: Self-pay

## 2013-05-14 ENCOUNTER — Ambulatory Visit: Payer: Medicare Other

## 2013-05-14 DIAGNOSIS — D649 Anemia, unspecified: Secondary | ICD-10-CM

## 2013-05-14 LAB — CBC WITH DIFFERENTIAL/PLATELET
BASO%: 1.1 % (ref 0.0–2.0)
BASOS ABS: 0.1 10*3/uL (ref 0.0–0.1)
EOS ABS: 0.1 10*3/uL (ref 0.0–0.5)
EOS%: 0.9 % (ref 0.0–7.0)
HEMATOCRIT: 35.3 % (ref 34.8–46.6)
HEMOGLOBIN: 10.9 g/dL — AB (ref 11.6–15.9)
LYMPH%: 24.8 % (ref 14.0–49.7)
MCH: 24.2 pg — ABNORMAL LOW (ref 25.1–34.0)
MCHC: 30.8 g/dL — ABNORMAL LOW (ref 31.5–36.0)
MCV: 78.8 fL — AB (ref 79.5–101.0)
MONO#: 0.6 10*3/uL (ref 0.1–0.9)
MONO%: 8.2 % (ref 0.0–14.0)
NEUT#: 4.6 10*3/uL (ref 1.5–6.5)
NEUT%: 65 % (ref 38.4–76.8)
Platelets: 211 10*3/uL (ref 145–400)
RBC: 4.48 10*6/uL (ref 3.70–5.45)
RDW: 14.5 % (ref 11.2–14.5)
WBC: 7 10*3/uL (ref 3.9–10.3)
lymph#: 1.7 10*3/uL (ref 0.9–3.3)

## 2013-05-14 LAB — FERRITIN CHCC: Ferritin: 141 ng/ml (ref 9–269)

## 2013-05-14 MED ORDER — DARBEPOETIN ALFA-POLYSORBATE 200 MCG/0.4ML IJ SOLN
200.0000 ug | Freq: Once | INTRAMUSCULAR | Status: DC
  Filled 2013-05-14: qty 0.4

## 2013-06-29 LAB — HM DIABETES EYE EXAM

## 2013-06-30 ENCOUNTER — Other Ambulatory Visit: Payer: Self-pay | Admitting: Family Medicine

## 2013-07-01 ENCOUNTER — Other Ambulatory Visit: Payer: Self-pay | Admitting: Family Medicine

## 2013-07-09 ENCOUNTER — Other Ambulatory Visit (HOSPITAL_BASED_OUTPATIENT_CLINIC_OR_DEPARTMENT_OTHER): Payer: Medicare Other

## 2013-07-09 ENCOUNTER — Encounter: Payer: Self-pay | Admitting: Internal Medicine

## 2013-07-09 ENCOUNTER — Ambulatory Visit (HOSPITAL_BASED_OUTPATIENT_CLINIC_OR_DEPARTMENT_OTHER): Payer: Medicare Other

## 2013-07-09 VITALS — BP 177/51 | HR 93 | Temp 98.3°F

## 2013-07-09 DIAGNOSIS — D649 Anemia, unspecified: Secondary | ICD-10-CM

## 2013-07-09 LAB — CBC WITH DIFFERENTIAL/PLATELET
BASO%: 0.5 % (ref 0.0–2.0)
BASOS ABS: 0 10*3/uL (ref 0.0–0.1)
EOS ABS: 0.1 10*3/uL (ref 0.0–0.5)
EOS%: 1.8 % (ref 0.0–7.0)
HEMATOCRIT: 32.1 % — AB (ref 34.8–46.6)
HEMOGLOBIN: 9.8 g/dL — AB (ref 11.6–15.9)
LYMPH%: 23 % (ref 14.0–49.7)
MCH: 24.3 pg — AB (ref 25.1–34.0)
MCHC: 30.5 g/dL — ABNORMAL LOW (ref 31.5–36.0)
MCV: 79.7 fL (ref 79.5–101.0)
MONO#: 0.8 10*3/uL (ref 0.1–0.9)
MONO%: 11 % (ref 0.0–14.0)
NEUT#: 4.6 10*3/uL (ref 1.5–6.5)
NEUT%: 63.7 % (ref 38.4–76.8)
Platelets: 186 10*3/uL (ref 145–400)
RBC: 4.02 10*6/uL (ref 3.70–5.45)
RDW: 14.3 % (ref 11.2–14.5)
WBC: 7.2 10*3/uL (ref 3.9–10.3)
lymph#: 1.7 10*3/uL (ref 0.9–3.3)

## 2013-07-09 MED ORDER — DARBEPOETIN ALFA-POLYSORBATE 200 MCG/0.4ML IJ SOLN
200.0000 ug | Freq: Once | INTRAMUSCULAR | Status: AC
  Administered 2013-07-09: 200 ug via SUBCUTANEOUS
  Filled 2013-07-09: qty 0.4

## 2013-07-12 LAB — FERRITIN CHCC: Ferritin: 149 ng/ml (ref 9–269)

## 2013-07-16 ENCOUNTER — Other Ambulatory Visit: Payer: Medicare Other | Admitting: Lab

## 2013-07-16 ENCOUNTER — Ambulatory Visit (INDEPENDENT_AMBULATORY_CARE_PROVIDER_SITE_OTHER): Payer: Medicare Other | Admitting: Family Medicine

## 2013-07-16 ENCOUNTER — Encounter: Payer: Self-pay | Admitting: Family Medicine

## 2013-07-16 VITALS — BP 120/68 | HR 72 | Wt 102.0 lb

## 2013-07-16 DIAGNOSIS — H6120 Impacted cerumen, unspecified ear: Secondary | ICD-10-CM

## 2013-07-17 NOTE — Progress Notes (Signed)
   Subjective:    Patient ID: Chelsea Zuniga, female    DOB: 06-02-1921, 80 y.o.   MRN: [id number]  HPI She is here for consult concerning decreased hearing. Her daughter has been using wax remover especially in the left ear without much success.  Review of Systems     Objective:   Physical Exam The left canal was entirely impacted. Lavage was attempted however I had to manually remove it with alligator forceps. Most of the material that I removed with ear canal tissue. An attempt was made to remove material from the right canal however I was unable to.       Assessment & Plan:  Cerumen impaction  recommend he continue to use wax removing the material and return here in one week to try and remove her from the right canal.

## 2013-07-26 ENCOUNTER — Ambulatory Visit (INDEPENDENT_AMBULATORY_CARE_PROVIDER_SITE_OTHER): Payer: Medicare Other | Admitting: Family Medicine

## 2013-07-26 ENCOUNTER — Encounter: Payer: Self-pay | Admitting: Family Medicine

## 2013-07-26 VITALS — BP 112/58 | HR 80 | Wt 102.0 lb

## 2013-07-26 DIAGNOSIS — D649 Anemia, unspecified: Secondary | ICD-10-CM

## 2013-07-26 DIAGNOSIS — E119 Type 2 diabetes mellitus without complications: Secondary | ICD-10-CM

## 2013-07-26 DIAGNOSIS — E1159 Type 2 diabetes mellitus with other circulatory complications: Secondary | ICD-10-CM

## 2013-07-26 DIAGNOSIS — E785 Hyperlipidemia, unspecified: Secondary | ICD-10-CM

## 2013-07-26 DIAGNOSIS — I10 Essential (primary) hypertension: Secondary | ICD-10-CM

## 2013-07-26 DIAGNOSIS — K219 Gastro-esophageal reflux disease without esophagitis: Secondary | ICD-10-CM

## 2013-07-26 DIAGNOSIS — H6120 Impacted cerumen, unspecified ear: Secondary | ICD-10-CM

## 2013-07-26 DIAGNOSIS — E1169 Type 2 diabetes mellitus with other specified complication: Secondary | ICD-10-CM

## 2013-07-26 MED ORDER — PIOGLITAZONE HCL-METFORMIN HCL 15-850 MG PO TABS: ORAL_TABLET | ORAL | Status: DC

## 2013-07-26 MED ORDER — FERROUS FUMARATE 325 (106 FE) MG PO TABS: 1.0000 | ORAL_TABLET | Freq: Every day | ORAL | Status: AC

## 2013-07-26 MED ORDER — LISINOPRIL-HYDROCHLOROTHIAZIDE 20-12.5 MG PO TABS: ORAL_TABLET | ORAL | Status: DC

## 2013-07-26 MED ORDER — AMLODIPINE BESYLATE 5 MG PO TABS: ORAL_TABLET | ORAL | Status: DC

## 2013-07-26 MED ORDER — METOPROLOL SUCCINATE ER 100 MG PO TB24: ORAL_TABLET | ORAL | Status: DC

## 2013-07-26 MED ORDER — SIMVASTATIN 80 MG PO TABS: ORAL_TABLET | ORAL | Status: DC

## 2013-07-26 NOTE — Progress Notes (Signed)
   Subjective:    Patient ID: Chelsea Zuniga, female    DOB: 11-Jan-1922, 80 y.o.   MRN: [id number]  HPI She is here for ear lavage on the right. She has been using earwax removing material. She also needs her medications renewed. Your blood work was recently done.    Review of Systems     Objective:   Physical Exam Right canal was lavaged several times however the tympanic membrane could not the visualized. Whitish debris was present in the canal.      Assessment & Plan:  Cerumen impaction - Plan: Ambulatory referral to ENT  Hypertension associated with diabetes - Plan: metoprolol succinate (TOPROL-XL) 100 MG 24 hr tablet, amLODipine (NORVASC) 5 MG tablet  Anemia  Diabetes mellitus  Hyperlipidemia LDL goal <70  GERD (gastroesophageal reflux disease)  her medications were renewed and she will be referred to ENT for further evaluation.

## 2013-07-29 ENCOUNTER — Other Ambulatory Visit: Payer: Self-pay | Admitting: Family Medicine

## 2013-09-03 ENCOUNTER — Other Ambulatory Visit (HOSPITAL_BASED_OUTPATIENT_CLINIC_OR_DEPARTMENT_OTHER): Payer: Medicare Other

## 2013-09-03 ENCOUNTER — Other Ambulatory Visit: Payer: Self-pay | Admitting: Oncology

## 2013-09-03 ENCOUNTER — Ambulatory Visit: Payer: Medicare Other

## 2013-09-03 DIAGNOSIS — D649 Anemia, unspecified: Secondary | ICD-10-CM

## 2013-09-03 LAB — CBC WITH DIFFERENTIAL/PLATELET
BASO%: 1.1 % (ref 0.0–2.0)
BASOS ABS: 0.1 10*3/uL (ref 0.0–0.1)
EOS%: 5.8 % (ref 0.0–7.0)
Eosinophils Absolute: 0.4 10*3/uL (ref 0.0–0.5)
HEMATOCRIT: 36.5 % (ref 34.8–46.6)
HEMOGLOBIN: 11.1 g/dL — AB (ref 11.6–15.9)
LYMPH%: 25.9 % (ref 14.0–49.7)
MCH: 24 pg — ABNORMAL LOW (ref 25.1–34.0)
MCHC: 30.5 g/dL — ABNORMAL LOW (ref 31.5–36.0)
MCV: 78.6 fL — AB (ref 79.5–101.0)
MONO#: 0.7 10*3/uL (ref 0.1–0.9)
MONO%: 9.9 % (ref 0.0–14.0)
NEUT%: 57.3 % (ref 38.4–76.8)
NEUTROS ABS: 3.8 10*3/uL (ref 1.5–6.5)
Platelets: 195 10*3/uL (ref 145–400)
RBC: 4.65 10*6/uL (ref 3.70–5.45)
RDW: 14 % (ref 11.2–14.5)
WBC: 6.7 10*3/uL (ref 3.9–10.3)
lymph#: 1.7 10*3/uL (ref 0.9–3.3)

## 2013-09-03 NOTE — Progress Notes (Signed)
Natalia LeatherwoodKatherine here for labs and possible Aranesp injection if HGB <10.0.  Today Hgb is 11.1.  No injection today.

## 2013-09-06 LAB — FERRITIN CHCC: Ferritin: 121 ng/ml (ref 9–269)

## 2013-09-10 ENCOUNTER — Other Ambulatory Visit: Payer: Medicare Other | Admitting: Lab

## 2013-10-24 ENCOUNTER — Other Ambulatory Visit: Payer: Self-pay | Admitting: Family Medicine

## 2013-10-29 ENCOUNTER — Other Ambulatory Visit: Payer: Self-pay | Admitting: Physician Assistant

## 2013-10-29 ENCOUNTER — Other Ambulatory Visit (HOSPITAL_BASED_OUTPATIENT_CLINIC_OR_DEPARTMENT_OTHER): Payer: Medicare Other

## 2013-10-29 ENCOUNTER — Ambulatory Visit (HOSPITAL_BASED_OUTPATIENT_CLINIC_OR_DEPARTMENT_OTHER): Payer: Medicare Other

## 2013-10-29 VITALS — BP 180/59 | HR 90 | Temp 98.2°F

## 2013-10-29 DIAGNOSIS — D509 Iron deficiency anemia, unspecified: Secondary | ICD-10-CM

## 2013-10-29 DIAGNOSIS — D649 Anemia, unspecified: Secondary | ICD-10-CM

## 2013-10-29 DIAGNOSIS — D638 Anemia in other chronic diseases classified elsewhere: Secondary | ICD-10-CM

## 2013-10-29 LAB — CBC WITH DIFFERENTIAL/PLATELET
BASO%: 1.4 % (ref 0.0–2.0)
Basophils Absolute: 0.1 10e3/uL (ref 0.0–0.1)
EOS%: 6.2 % (ref 0.0–7.0)
Eosinophils Absolute: 0.5 10e3/uL (ref 0.0–0.5)
HCT: 32.6 % — ABNORMAL LOW (ref 34.8–46.6)
HGB: 9.9 g/dL — ABNORMAL LOW (ref 11.6–15.9)
LYMPH%: 27.2 % (ref 14.0–49.7)
MCH: 24.1 pg — ABNORMAL LOW (ref 25.1–34.0)
MCHC: 30.4 g/dL — ABNORMAL LOW (ref 31.5–36.0)
MCV: 79.2 fL — ABNORMAL LOW (ref 79.5–101.0)
MONO#: 0.7 10e3/uL (ref 0.1–0.9)
MONO%: 9.7 % (ref 0.0–14.0)
NEUT#: 4 10e3/uL (ref 1.5–6.5)
NEUT%: 55.5 % (ref 38.4–76.8)
Platelets: 237 10e3/uL (ref 145–400)
RBC: 4.11 10e6/uL (ref 3.70–5.45)
RDW: 14.2 % (ref 11.2–14.5)
WBC: 7.2 10e3/uL (ref 3.9–10.3)
lymph#: 2 10e3/uL (ref 0.9–3.3)

## 2013-10-29 MED ORDER — DARBEPOETIN ALFA-POLYSORBATE 200 MCG/0.4ML IJ SOLN
200.0000 ug | Freq: Once | INTRAMUSCULAR | Status: AC
  Administered 2013-10-29: 200 ug via SUBCUTANEOUS
  Filled 2013-10-29: qty 0.4

## 2013-11-01 LAB — FERRITIN CHCC: FERRITIN: 117 ng/mL (ref 9–269)

## 2013-11-12 ENCOUNTER — Other Ambulatory Visit: Payer: Medicare Other | Admitting: Lab

## 2013-12-24 ENCOUNTER — Ambulatory Visit: Payer: Medicare Other

## 2013-12-24 ENCOUNTER — Other Ambulatory Visit (HOSPITAL_BASED_OUTPATIENT_CLINIC_OR_DEPARTMENT_OTHER): Payer: Medicare Other

## 2013-12-24 DIAGNOSIS — D509 Iron deficiency anemia, unspecified: Secondary | ICD-10-CM

## 2013-12-24 DIAGNOSIS — D649 Anemia, unspecified: Secondary | ICD-10-CM

## 2013-12-24 DIAGNOSIS — D539 Nutritional anemia, unspecified: Secondary | ICD-10-CM

## 2013-12-24 LAB — CBC WITH DIFFERENTIAL/PLATELET
BASO%: 1.1 % (ref 0.0–2.0)
Basophils Absolute: 0.1 10*3/uL (ref 0.0–0.1)
EOS%: 2.4 % (ref 0.0–7.0)
Eosinophils Absolute: 0.2 10*3/uL (ref 0.0–0.5)
HCT: 35.1 % (ref 34.8–46.6)
HGB: 10.7 g/dL — ABNORMAL LOW (ref 11.6–15.9)
LYMPH%: 33 % (ref 14.0–49.7)
MCH: 23.8 pg — ABNORMAL LOW (ref 25.1–34.0)
MCHC: 30.4 g/dL — AB (ref 31.5–36.0)
MCV: 78.4 fL — ABNORMAL LOW (ref 79.5–101.0)
MONO#: 0.7 10*3/uL (ref 0.1–0.9)
MONO%: 10.8 % (ref 0.0–14.0)
NEUT#: 3.3 10*3/uL (ref 1.5–6.5)
NEUT%: 52.7 % (ref 38.4–76.8)
Platelets: 213 10*3/uL (ref 145–400)
RBC: 4.48 10*6/uL (ref 3.70–5.45)
RDW: 14.1 % (ref 11.2–14.5)
WBC: 6.2 10*3/uL (ref 3.9–10.3)
lymph#: 2.1 10*3/uL (ref 0.9–3.3)

## 2013-12-24 MED ORDER — DARBEPOETIN ALFA-POLYSORBATE 200 MCG/0.4ML IJ SOLN
200.0000 ug | Freq: Once | INTRAMUSCULAR | Status: DC
  Filled 2013-12-24: qty 0.4

## 2013-12-27 LAB — FERRITIN CHCC: FERRITIN: 130 ng/mL (ref 9–269)

## 2013-12-28 LAB — HM DIABETES EYE EXAM

## 2013-12-29 ENCOUNTER — Other Ambulatory Visit: Payer: Self-pay | Admitting: Family Medicine

## 2013-12-29 NOTE — Telephone Encounter (Signed)
Is this okay to refill? 

## 2014-01-03 ENCOUNTER — Encounter: Payer: Self-pay | Admitting: Internal Medicine

## 2014-01-04 ENCOUNTER — Telehealth: Payer: Self-pay | Admitting: Family Medicine

## 2014-01-05 NOTE — Telephone Encounter (Signed)
LEFT MESSAGE WORD FOR WORD 

## 2014-01-05 NOTE — Telephone Encounter (Signed)
Let her know that this is okay

## 2014-01-29 ENCOUNTER — Other Ambulatory Visit: Payer: Self-pay | Admitting: Family Medicine

## 2014-02-17 ENCOUNTER — Other Ambulatory Visit: Payer: Self-pay | Admitting: Nurse Practitioner

## 2014-02-17 DIAGNOSIS — D509 Iron deficiency anemia, unspecified: Secondary | ICD-10-CM

## 2014-02-18 ENCOUNTER — Telehealth: Payer: Self-pay | Admitting: Oncology

## 2014-02-18 ENCOUNTER — Other Ambulatory Visit (HOSPITAL_BASED_OUTPATIENT_CLINIC_OR_DEPARTMENT_OTHER): Payer: Medicare Other

## 2014-02-18 ENCOUNTER — Ambulatory Visit (HOSPITAL_BASED_OUTPATIENT_CLINIC_OR_DEPARTMENT_OTHER): Payer: Medicare Other

## 2014-02-18 VITALS — BP 148/60 | HR 100 | Temp 98.0°F

## 2014-02-18 DIAGNOSIS — D638 Anemia in other chronic diseases classified elsewhere: Secondary | ICD-10-CM

## 2014-02-18 DIAGNOSIS — D509 Iron deficiency anemia, unspecified: Secondary | ICD-10-CM

## 2014-02-18 LAB — CBC WITH DIFFERENTIAL/PLATELET
BASO%: 0.8 % (ref 0.0–2.0)
Basophils Absolute: 0.1 10*3/uL (ref 0.0–0.1)
EOS%: 1.6 % (ref 0.0–7.0)
Eosinophils Absolute: 0.1 10*3/uL (ref 0.0–0.5)
HCT: 31.7 % — ABNORMAL LOW (ref 34.8–46.6)
HGB: 9.6 g/dL — ABNORMAL LOW (ref 11.6–15.9)
LYMPH%: 30 % (ref 14.0–49.7)
MCH: 24 pg — AB (ref 25.1–34.0)
MCHC: 30.3 g/dL — ABNORMAL LOW (ref 31.5–36.0)
MCV: 79.2 fL — ABNORMAL LOW (ref 79.5–101.0)
MONO#: 0.7 10*3/uL (ref 0.1–0.9)
MONO%: 10.3 % (ref 0.0–14.0)
NEUT#: 3.8 10*3/uL (ref 1.5–6.5)
NEUT%: 57.3 % (ref 38.4–76.8)
Platelets: 217 10*3/uL (ref 145–400)
RBC: 4 10*6/uL (ref 3.70–5.45)
RDW: 14.4 % (ref 11.2–14.5)
WBC: 6.6 10*3/uL (ref 3.9–10.3)
lymph#: 2 10*3/uL (ref 0.9–3.3)

## 2014-02-18 MED ORDER — DARBEPOETIN ALFA-POLYSORBATE 200 MCG/0.4ML IJ SOLN
200.0000 ug | Freq: Once | INTRAMUSCULAR | Status: AC
  Administered 2014-02-18: 200 ug via SUBCUTANEOUS
  Filled 2014-02-18: qty 0.4

## 2014-02-18 NOTE — Patient Instructions (Signed)
Darbepoetin Alfa injection What is this medicine? DARBEPOETIN ALFA (dar be POE e tin AL fa) helps your body make more red blood cells. It is used to treat anemia caused by chronic kidney failure and chemotherapy. This medicine may be used for other purposes; ask your health care provider or pharmacist if you have questions. COMMON BRAND NAME(S): Aranesp What should I tell my health care provider before I take this medicine? They need to know if you have any of these conditions: -blood clotting disorders or history of blood clots -cancer patient not on chemotherapy -cystic fibrosis -heart disease, such as angina, heart failure, or a history of a heart attack -hemoglobin level of 12 g/dL or greater -high blood pressure -low levels of folate, iron, or vitamin B12 -seizures -an unusual or allergic reaction to darbepoetin, erythropoietin, albumin, hamster proteins, latex, other medicines, foods, dyes, or preservatives -pregnant or trying to get pregnant -breast-feeding How should I use this medicine? This medicine is for injection into a vein or under the skin. It is usually given by a health care professional in a hospital or clinic setting. If you get this medicine at home, you will be taught how to prepare and give this medicine. Do not shake the solution before you withdraw a dose. Use exactly as directed. Take your medicine at regular intervals. Do not take your medicine more often than directed. It is important that you put your used needles and syringes in a special sharps container. Do not put them in a trash can. If you do not have a sharps container, call your pharmacist or healthcare provider to get one. Talk to your pediatrician regarding the use of this medicine in children. While this medicine may be used in children as young as 1 year for selected conditions, precautions do apply. Overdosage: If you think you have taken too much of this medicine contact a poison control center or  emergency room at once. NOTE: This medicine is only for you. Do not share this medicine with others. What if I miss a dose? If you miss a dose, take it as soon as you can. If it is almost time for your next dose, take only that dose. Do not take double or extra doses. What may interact with this medicine? Do not take this medicine with any of the following medications: -epoetin alfa This list may not describe all possible interactions. Give your health care provider a list of all the medicines, herbs, non-prescription drugs, or dietary supplements you use. Also tell them if you smoke, drink alcohol, or use illegal drugs. Some items may interact with your medicine. What should I watch for while using this medicine? Visit your prescriber or health care professional for regular checks on your progress and for the needed blood tests and blood pressure measurements. It is especially important for the doctor to make sure your hemoglobin level is in the desired range, to limit the risk of potential side effects and to give you the best benefit. Keep all appointments for any recommended tests. Check your blood pressure as directed. Ask your doctor what your blood pressure should be and when you should contact him or her. As your body makes more red blood cells, you may need to take iron, folic acid, or vitamin B supplements. Ask your doctor or health care provider which products are right for you. If you have kidney disease continue dietary restrictions, even though this medication can make you feel better. Talk with your doctor or health   care professional about the foods you eat and the vitamins that you take. What side effects may I notice from receiving this medicine? Side effects that you should report to your doctor or health care professional as soon as possible: -allergic reactions like skin rash, itching or hives, swelling of the face, lips, or tongue -breathing problems -changes in vision -chest  pain -confusion, trouble speaking or understanding -feeling faint or lightheaded, falls -high blood pressure -muscle aches or pains -pain, swelling, warmth in the leg -rapid weight gain -severe headaches -sudden numbness or weakness of the face, arm or leg -trouble walking, dizziness, loss of balance or coordination -seizures (convulsions) -swelling of the ankles, feet, hands -unusually weak or tired Side effects that usually do not require medical attention (report to your doctor or health care professional if they continue or are bothersome): -diarrhea -fever, chills (flu-like symptoms) -headaches -nausea, vomiting -redness, stinging, or swelling at site where injected This list may not describe all possible side effects. Call your doctor for medical advice about side effects. You may report side effects to FDA at 1-800-FDA-1088. Where should I keep my medicine? Keep out of the reach of children. Store in a refrigerator between 2 and 8 degrees C (36 and 46 degrees F). Do not freeze. Do not shake. Throw away any unused portion if using a single-dose vial. Throw away any unused medicine after the expiration date. NOTE: This sheet is a summary. It may not cover all possible information. If you have questions about this medicine, talk to your doctor, pharmacist, or health care provider.  2015, Elsevier/Gold Standard. (2008-04-05 10:23:57)  

## 2014-02-18 NOTE — Telephone Encounter (Signed)
pt came in and needed to r/s appt...done...printed pt new sched for Dec

## 2014-02-21 LAB — FERRITIN CHCC: Ferritin: 144 ng/ml (ref 9–269)

## 2014-03-02 ENCOUNTER — Other Ambulatory Visit: Payer: Self-pay | Admitting: Nurse Practitioner

## 2014-03-11 ENCOUNTER — Telehealth: Payer: Self-pay | Admitting: Nurse Practitioner

## 2014-03-11 NOTE — Telephone Encounter (Signed)
S/w pt's daughter advised appt chg from 12/18 ([facility]) to 12/21 @ 3pm. Pt's daughter says she can get here by 3pm but no earlier.

## 2014-04-15 ENCOUNTER — Ambulatory Visit: Payer: Medicare Other | Admitting: Nurse Practitioner

## 2014-04-15 ENCOUNTER — Ambulatory Visit: Payer: Medicare Other

## 2014-04-15 ENCOUNTER — Other Ambulatory Visit: Payer: Medicare Other

## 2014-04-22 ENCOUNTER — Other Ambulatory Visit: Payer: Medicare Other

## 2014-04-22 ENCOUNTER — Other Ambulatory Visit: Payer: Self-pay | Admitting: *Deleted

## 2014-04-22 ENCOUNTER — Ambulatory Visit: Payer: Medicare Other

## 2014-04-22 ENCOUNTER — Ambulatory Visit: Payer: Medicare Other | Admitting: Nurse Practitioner

## 2014-04-22 DIAGNOSIS — D509 Iron deficiency anemia, unspecified: Secondary | ICD-10-CM

## 2014-04-25 ENCOUNTER — Ambulatory Visit (HOSPITAL_BASED_OUTPATIENT_CLINIC_OR_DEPARTMENT_OTHER): Payer: Medicare Other | Admitting: Lab

## 2014-04-25 ENCOUNTER — Encounter: Payer: Self-pay | Admitting: Nurse Practitioner

## 2014-04-25 ENCOUNTER — Telehealth: Payer: Self-pay | Admitting: Nurse Practitioner

## 2014-04-25 ENCOUNTER — Ambulatory Visit: Payer: Medicare Other

## 2014-04-25 ENCOUNTER — Ambulatory Visit (HOSPITAL_BASED_OUTPATIENT_CLINIC_OR_DEPARTMENT_OTHER): Payer: Medicare Other | Admitting: Nurse Practitioner

## 2014-04-25 VITALS — BP 166/56 | HR 78 | Temp 97.5°F | Resp 18 | Ht 59.5 in | Wt 98.3 lb

## 2014-04-25 DIAGNOSIS — D638 Anemia in other chronic diseases classified elsewhere: Secondary | ICD-10-CM

## 2014-04-25 DIAGNOSIS — M79605 Pain in left leg: Secondary | ICD-10-CM | POA: Insufficient documentation

## 2014-04-25 DIAGNOSIS — D509 Iron deficiency anemia, unspecified: Secondary | ICD-10-CM

## 2014-04-25 DIAGNOSIS — M79604 Pain in right leg: Secondary | ICD-10-CM

## 2014-04-25 LAB — CBC WITH DIFFERENTIAL/PLATELET
BASO%: 0.5 % (ref 0.0–2.0)
Basophils Absolute: 0 10*3/uL (ref 0.0–0.1)
EOS%: 1 % (ref 0.0–7.0)
Eosinophils Absolute: 0.1 10*3/uL (ref 0.0–0.5)
HEMATOCRIT: 33.8 % — AB (ref 34.8–46.6)
HGB: 10.5 g/dL — ABNORMAL LOW (ref 11.6–15.9)
LYMPH#: 1.5 10*3/uL (ref 0.9–3.3)
LYMPH%: 24.3 % (ref 14.0–49.7)
MCH: 24.2 pg — AB (ref 25.1–34.0)
MCHC: 31.1 g/dL — AB (ref 31.5–36.0)
MCV: 78.1 fL — ABNORMAL LOW (ref 79.5–101.0)
MONO#: 0.5 10*3/uL (ref 0.1–0.9)
MONO%: 8.9 % (ref 0.0–14.0)
NEUT#: 4 10*3/uL (ref 1.5–6.5)
NEUT%: 65.3 % (ref 38.4–76.8)
Platelets: 227 10*3/uL (ref 145–400)
RBC: 4.33 10*6/uL (ref 3.70–5.45)
RDW: 14.3 % (ref 11.2–14.5)
WBC: 6.1 10*3/uL (ref 3.9–10.3)

## 2014-04-25 MED ORDER — TRAMADOL HCL 50 MG PO TABS: 50.0000 mg | ORAL_TABLET | Freq: Four times a day (QID) | ORAL | Status: AC | PRN

## 2014-04-25 NOTE — Progress Notes (Signed)
ID: [name]   DOB: 1921-11-25  MR#: [id number]  HRC#:[id number]  PCP: Wyatt Haste, MD GYN:  SU:  OTHER MD:   HISTORY OF PRESENT ILLNESS: Chelsea Zuniga is a 80 year old, [place] woman referred by Dr. Redmond School for evaluation of anemia.  We have hemoglobin levels dating back to October 2004, when the hemoglobin level was 11.6, with an MCV of 76.  The white cell count was 12.8, platelets 358,000.  Serum creatinine 0.7, and liver function tests were normal.  The total protein was slightly elevated at 8.6, with an albumin of 4.6.    In March 2005, the patient's hemoglobin was low, but I cannot read it in this faxed copy.  On 01/11/04, the hemoglobin was 10.4, with an MCV of 77.  The white cell count was 9.7, and the platelets 292,000.  On the same day, Dr. Redmond School obtained a ferritin which was 57, with an iron saturation of 17% (the patient is on iron supplementation orally). Her subsequent history is as detailed below  INTERVAL HISTORY: Chelsea Zuniga returns today for follow up of her anemia, accompanied by her daughter. Chelsea Zuniga is scheduled for aranesp injections every 2 months, but lately has only needed them every 4 months. Her energy level is fair and she is only short of breath with exertion. She continues on an iron supplement daily as well.   REVIEW OF SYSTEMS: Chelsea Zuniga denies fevers, chills, nausea, or vomiting. She is constipated at times, possibly because of the iron supplement, and uses magnesium citrate to have a good bowel movement when necessary. Her appetite is is healthy and she stays well hydrated. She has bilateral leg pain, possibly related to arthritis, that is not relieved much by ibuprofen. Sometimes the aching keeps her up at night.The ibuprofen works well on her headaches however. She denies dizziness, unexplained weight loss, or night sweats. She has no chest pain, cough, or palpations. A detailed review of systems is otherwise negative.   PAST  MEDICAL HISTORY: Past Medical History  Diagnosis Date  . Diabetes mellitus   . Hypertension   . Dyslipidemia   . Arthritis   . Diverticulosis   . Hemorrhoids   . Personal history of colonic polyps   . Aortic stenosis   . Anemia   . GERD (gastroesophageal reflux disease)   . Bowen's disease   Significant for  history of colonic polyps, history of hemorrhoids, history of diverticular disease, history of diabetes mellitus with retinopathy and nephropathy, history of GERD, history of hypertension, history of hypercholesterolemia, history of osteoarthritis, and she is status post cataract surgery and status post total vaginal hysterectomy.    PAST SURGICAL HISTORY: Past Surgical History  Procedure Laterality Date  . Abdominal hysterectomy  1997  . Small intestine surgery  1999    FAMILY HISTORY No family history on file. The patient's father died from "old age."  Her mother died from coronary artery disease.    GYNECOLOGIC HISTORY: She is G2, P2.     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  She has been a widow since 20 when her husband died from a heart attack.  The patient lives with her daughter, Chelsea Zuniga, Chelsea Zuniga's daughter, Chelsea Zuniga, and Chelsea Zuniga's son, the patient's Chelsea Zuniga.  Chelsea Zuniga's brother, the patient's other child, is Chelsea Zuniga, and he lives in [place].  The patient is a Psychologist, forensic.     ADVANCED DIRECTIVES:  HEALTH MAINTENANCE: History  Substance Use Topics  . Smoking status:  Never Smoker   . Smokeless tobacco: Never Used  . Alcohol Use: Not on file     Colonoscopy:  PAP:  Bone density:  Lipid panel:  No Known Allergies  Current Outpatient Prescriptions  Medication Sig Dispense Refill  . amLODipine (NORVASC) 5 MG tablet take 1 tablet by mouth once daily 90 tablet 3  . aspirin 81 MG tablet Take 81 mg by mouth daily.     . cloNIDine (CATAPRES) 0.1 MG tablet TAKE 1 TABLET BY MOUTH TWICE DAILY 60 tablet 11  .  ferrous fumarate (HEMOCYTE - 106 MG FE) 325 (106 FE) MG TABS tablet Take 1 tablet (106 mg of iron total) by mouth daily. 90 each 3  . lisinopril-hydrochlorothiazide (PRINZIDE,ZESTORETIC) 20-12.5 MG per tablet TAKE 1 TABLET BY MOUTH ONCE DAILY 90 tablet 3  . metoprolol succinate (TOPROL-XL) 100 MG 24 hr tablet take 1 tablet by mouth once daily 90 tablet 3  . pioglitazone-metformin (ACTOPLUS MET) 15-850 MG per tablet TAKE 1 TABLET BY MOUTH TWICE DAILY WITH MEALS 180 tablet 0  . simvastatin (ZOCOR) 80 MG tablet TAKE 1 TABLET BY MOUTH AT BEDTIME 90 tablet 3  . traMADol (ULTRAM) 50 MG tablet Take 1 tablet (50 mg total) by mouth every 6 (six) hours as needed. 60 tablet 0   No current facility-administered medications for this visit.    OBJECTIVE: Elderly African American woman who appears stated age 62 Vitals:   04/25/14 1512  BP: 166/56  Pulse: 78  Temp: 97.5 F (36.4 C)  Resp: 18     Body mass index is 19.53 kg/(m^2).    ECOG FS:2  Skin: warm, dry  HEENT: sclerae anicteric, conjunctivae pink, oropharynx clear. No thrush or mucositis. Hard of hearing Lymph Nodes: No cervical or supraclavicular lymphadenopathy  Lungs: clear to auscultation bilaterally, no rales, wheezes, or rhonci  Heart: regular rate and rhythm  Abdomen: round, soft, non tender, positive bowel sounds  Musculoskeletal: No focal spinal tenderness, no peripheral edema  Neuro: non focal, well oriented, positive affect    LAB RESULTS: Results for SINIA, ANTOSH (MRN [id number]) as of 03/25/2013 14:00  Ref. Range 07/17/2012 15:26 09/11/2012 15:18 11/18/2012 15:25 01/15/2013 15:18 03/19/2013 15:14  Ferritin Latest Range: 10-291 ng/mL 200 159 156 120 123    Lab Results  Component Value Date   WBC 6.1 04/25/2014   NEUTROABS 4.0 04/25/2014   HGB 10.5* 04/25/2014   HCT 33.8* 04/25/2014   MCV 78.1* 04/25/2014   PLT 227 04/25/2014      Chemistry      Component Value Date/Time   NA 138 11/12/2011 1613   K 4.3  11/12/2011 1613   CL 102 11/12/2011 1613   CO2 28 11/12/2011 1613   BUN 16 11/12/2011 1613   CREATININE 0.87 11/12/2011 1613   CREATININE 0.78 01/14/2006 1515      Component Value Date/Time   CALCIUM 9.3 11/12/2011 1613   ALKPHOS 43 11/12/2011 1613   AST 14 11/12/2011 1613   ALT 9 11/12/2011 1613   BILITOT 0.3 11/12/2011 1613       No results found for: LABCA2  No components found for: LABCA125  No results for input(s): INR in the last 168 hours.  Urinalysis No results found for: COLORURINE  STUDIES: No results found.  ASSESSMENT: 80 y.o. [place] woman with a history of chronic anemia responding to minimal doses of aranesp  PLAN:  Chelsea Zuniga is doing well today. The labs were reviewed in detail and were generally stable. Her hgb is  up to 10.5, so we will hold today's dose of aranesp. She was last treated in October.   I have written her for a small supply of 63m tramadol for her bilateral leg pain. I suggested she reserve these for use at night so that she may rest easier.   Chelsea Zuniga return every 2 months for labs and an aranesp appointment. She will likely need the injection with her next appointment. Her next visit to this office will be next December. She understands and agrees with this plan. She has been encouraged to call with any issues that might arise before her next visit here.   FMarcelino Duster   04/25/2014

## 2014-04-25 NOTE — Telephone Encounter (Signed)
per pof to sch pt appt-gave pt copy of sch °

## 2014-04-26 LAB — FERRITIN CHCC: Ferritin: 159 ng/ml (ref 9–269)

## 2014-04-28 ENCOUNTER — Other Ambulatory Visit: Payer: Self-pay | Admitting: Family Medicine

## 2014-04-28 NOTE — Telephone Encounter (Signed)
Is this okay to refill?  Pt hasnt been in for a while but with her being in her 10590's i didn't know if she needed to come in first

## 2014-06-20 ENCOUNTER — Ambulatory Visit (HOSPITAL_BASED_OUTPATIENT_CLINIC_OR_DEPARTMENT_OTHER): Payer: Medicare Other

## 2014-06-20 ENCOUNTER — Other Ambulatory Visit (HOSPITAL_BASED_OUTPATIENT_CLINIC_OR_DEPARTMENT_OTHER): Payer: Medicare Other

## 2014-06-20 ENCOUNTER — Ambulatory Visit: Payer: Medicare Other

## 2014-06-20 DIAGNOSIS — D509 Iron deficiency anemia, unspecified: Secondary | ICD-10-CM

## 2014-06-20 DIAGNOSIS — D638 Anemia in other chronic diseases classified elsewhere: Secondary | ICD-10-CM

## 2014-06-20 LAB — CBC WITH DIFFERENTIAL/PLATELET
BASO%: 0.7 % (ref 0.0–2.0)
BASOS ABS: 0 10*3/uL (ref 0.0–0.1)
EOS%: 0.9 % (ref 0.0–7.0)
Eosinophils Absolute: 0.1 10*3/uL (ref 0.0–0.5)
HEMATOCRIT: 32.8 % — AB (ref 34.8–46.6)
HEMOGLOBIN: 9.8 g/dL — AB (ref 11.6–15.9)
LYMPH#: 1.6 10*3/uL (ref 0.9–3.3)
LYMPH%: 25.8 % (ref 14.0–49.7)
MCH: 24.1 pg — AB (ref 25.1–34.0)
MCHC: 30 g/dL — ABNORMAL LOW (ref 31.5–36.0)
MCV: 80.3 fL (ref 79.5–101.0)
MONO#: 0.6 10*3/uL (ref 0.1–0.9)
MONO%: 9.8 % (ref 0.0–14.0)
NEUT#: 4 10*3/uL (ref 1.5–6.5)
NEUT%: 62.8 % (ref 38.4–76.8)
PLATELETS: 245 10*3/uL (ref 145–400)
RBC: 4.08 10*6/uL (ref 3.70–5.45)
RDW: 13.9 % (ref 11.2–14.5)
WBC: 6.3 10*3/uL (ref 3.9–10.3)

## 2014-06-20 MED ORDER — DARBEPOETIN ALFA 200 MCG/0.4ML IJ SOSY
200.0000 ug | PREFILLED_SYRINGE | Freq: Once | INTRAMUSCULAR | Status: AC
  Administered 2014-06-20: 200 ug via SUBCUTANEOUS
  Filled 2014-06-20: qty 0.4

## 2014-06-21 LAB — FERRITIN CHCC: Ferritin: 132 ng/ml (ref 9–269)

## 2014-07-26 ENCOUNTER — Other Ambulatory Visit: Payer: Self-pay | Admitting: Family Medicine

## 2014-07-29 ENCOUNTER — Other Ambulatory Visit: Payer: Self-pay | Admitting: Family Medicine

## 2014-08-01 ENCOUNTER — Telehealth: Payer: Self-pay | Admitting: Family Medicine

## 2014-08-01 NOTE — Telephone Encounter (Signed)
This has been done.

## 2014-08-01 NOTE — Telephone Encounter (Signed)
[facility] Pioglitazone/Metformin 15-850 mg #180

## 2014-08-01 NOTE — Telephone Encounter (Signed)
Don't let her run out and schedule her schedule an appointment

## 2014-08-01 NOTE — Telephone Encounter (Signed)
Pt has a;ppointment 09/05/14

## 2014-08-01 NOTE — Telephone Encounter (Signed)
Dr.Lalonde is this okay I dont see where her A1c has been checked since 2014

## 2014-08-15 ENCOUNTER — Ambulatory Visit: Payer: Medicare Other

## 2014-08-15 ENCOUNTER — Other Ambulatory Visit (HOSPITAL_BASED_OUTPATIENT_CLINIC_OR_DEPARTMENT_OTHER): Payer: Medicare Other

## 2014-08-15 DIAGNOSIS — D649 Anemia, unspecified: Secondary | ICD-10-CM | POA: Diagnosis present

## 2014-08-15 DIAGNOSIS — D509 Iron deficiency anemia, unspecified: Secondary | ICD-10-CM

## 2014-08-15 LAB — CBC WITH DIFFERENTIAL/PLATELET
BASO%: 1 % (ref 0.0–2.0)
Basophils Absolute: 0.1 10*3/uL (ref 0.0–0.1)
EOS ABS: 0 10*3/uL (ref 0.0–0.5)
EOS%: 0.6 % (ref 0.0–7.0)
HCT: 34.6 % — ABNORMAL LOW (ref 34.8–46.6)
HGB: 10.5 g/dL — ABNORMAL LOW (ref 11.6–15.9)
LYMPH%: 27.2 % (ref 14.0–49.7)
MCH: 23.6 pg — ABNORMAL LOW (ref 25.1–34.0)
MCHC: 30.3 g/dL — ABNORMAL LOW (ref 31.5–36.0)
MCV: 77.8 fL — AB (ref 79.5–101.0)
MONO#: 0.6 10*3/uL (ref 0.1–0.9)
MONO%: 9.1 % (ref 0.0–14.0)
NEUT#: 4.1 10*3/uL (ref 1.5–6.5)
NEUT%: 62.1 % (ref 38.4–76.8)
PLATELETS: 218 10*3/uL (ref 145–400)
RBC: 4.45 10*6/uL (ref 3.70–5.45)
RDW: 13.9 % (ref 11.2–14.5)
WBC: 6.6 10*3/uL (ref 3.9–10.3)
lymph#: 1.8 10*3/uL (ref 0.9–3.3)

## 2014-08-15 MED ORDER — DARBEPOETIN ALFA 200 MCG/0.4ML IJ SOSY: 200.0000 ug | PREFILLED_SYRINGE | Freq: Once | INTRAMUSCULAR | Status: DC

## 2014-08-16 LAB — FERRITIN CHCC: FERRITIN: 139 ng/mL (ref 9–269)

## 2014-09-05 ENCOUNTER — Ambulatory Visit (INDEPENDENT_AMBULATORY_CARE_PROVIDER_SITE_OTHER): Payer: Medicare Other | Admitting: Family Medicine

## 2014-09-05 VITALS — BP 128/60 | HR 73 | Wt 94.0 lb

## 2014-09-05 DIAGNOSIS — H6123 Impacted cerumen, bilateral: Secondary | ICD-10-CM

## 2014-09-05 DIAGNOSIS — I10 Essential (primary) hypertension: Secondary | ICD-10-CM | POA: Diagnosis not present

## 2014-09-05 DIAGNOSIS — D509 Iron deficiency anemia, unspecified: Secondary | ICD-10-CM | POA: Diagnosis not present

## 2014-09-05 DIAGNOSIS — E1169 Type 2 diabetes mellitus with other specified complication: Secondary | ICD-10-CM

## 2014-09-05 DIAGNOSIS — E1159 Type 2 diabetes mellitus with other circulatory complications: Secondary | ICD-10-CM

## 2014-09-05 DIAGNOSIS — I152 Hypertension secondary to endocrine disorders: Secondary | ICD-10-CM

## 2014-09-05 DIAGNOSIS — E118 Type 2 diabetes mellitus with unspecified complications: Secondary | ICD-10-CM

## 2014-09-05 DIAGNOSIS — E785 Hyperlipidemia, unspecified: Secondary | ICD-10-CM | POA: Diagnosis not present

## 2014-09-05 NOTE — Progress Notes (Signed)
   Subjective:    Patient ID: Chelsea Zuniga, female    DOB: 1921/10/11, 81 y.o.   MRN: [id number]  HPI She is here for a diabetes recheck. She continues to be followed by hematology for her iron deficiency. She does get IV iron regularly. He continues on medications listed in the chart. She does not complain of abdominal pain, anorexia or early satiety. She does have difficulty with hearing and would like ears cleaned again. She continues on medications listed in the chart. Her children and grandchildren take care of her. Social history was reviewed.   Review of Systems     Objective:   Physical Exam Alert and in no distress. The left canal was lavaged without difficulty however the right canal still had cerumen in it.   A1c is 5.8     Assessment & Plan:  Hypertension associated with diabetes  Hyperlipidemia LDL goal <70  Type 2 diabetes mellitus with complication  Iron deficiency anemia  Cerumen impaction, bilateral continue on present medication regimen.return here after using cerumenex for another ear lavage.

## 2014-09-06 LAB — POCT GLYCOSYLATED HEMOGLOBIN (HGB A1C)

## 2014-09-06 NOTE — Addendum Note (Signed)
Addended by: Herminio CommonsJOHNSON, SABRINA A on: 09/06/2014 02:39 PM   Modules accepted: Orders

## 2014-10-10 ENCOUNTER — Ambulatory Visit (HOSPITAL_BASED_OUTPATIENT_CLINIC_OR_DEPARTMENT_OTHER): Payer: Medicare Other

## 2014-10-10 ENCOUNTER — Other Ambulatory Visit (HOSPITAL_BASED_OUTPATIENT_CLINIC_OR_DEPARTMENT_OTHER): Payer: Medicare Other

## 2014-10-10 ENCOUNTER — Ambulatory Visit: Payer: Medicare Other

## 2014-10-10 VITALS — BP 169/56 | HR 88 | Temp 98.4°F

## 2014-10-10 DIAGNOSIS — D649 Anemia, unspecified: Secondary | ICD-10-CM

## 2014-10-10 DIAGNOSIS — D509 Iron deficiency anemia, unspecified: Secondary | ICD-10-CM

## 2014-10-10 LAB — CBC WITH DIFFERENTIAL/PLATELET
BASO%: 0.6 % (ref 0.0–2.0)
BASOS ABS: 0 10*3/uL (ref 0.0–0.1)
EOS ABS: 0.1 10*3/uL (ref 0.0–0.5)
EOS%: 1.2 % (ref 0.0–7.0)
HCT: 31.4 % — ABNORMAL LOW (ref 34.8–46.6)
HEMOGLOBIN: 9.7 g/dL — AB (ref 11.6–15.9)
LYMPH%: 26.6 % (ref 14.0–49.7)
MCH: 24.4 pg — AB (ref 25.1–34.0)
MCHC: 30.9 g/dL — ABNORMAL LOW (ref 31.5–36.0)
MCV: 79 fL — ABNORMAL LOW (ref 79.5–101.0)
MONO#: 0.6 10*3/uL (ref 0.1–0.9)
MONO%: 10.2 % (ref 0.0–14.0)
NEUT#: 3.6 10*3/uL (ref 1.5–6.5)
NEUT%: 61.4 % (ref 38.4–76.8)
Platelets: 215 10*3/uL (ref 145–400)
RBC: 3.97 10*6/uL (ref 3.70–5.45)
RDW: 14.2 % (ref 11.2–14.5)
WBC: 5.9 10*3/uL (ref 3.9–10.3)
lymph#: 1.6 10*3/uL (ref 0.9–3.3)

## 2014-10-10 MED ORDER — DARBEPOETIN ALFA 200 MCG/0.4ML IJ SOSY
200.0000 ug | PREFILLED_SYRINGE | Freq: Once | INTRAMUSCULAR | Status: AC
  Administered 2014-10-10: 200 ug via SUBCUTANEOUS
  Filled 2014-10-10: qty 0.4

## 2014-10-11 LAB — FERRITIN CHCC: Ferritin: 116 ng/ml (ref 9–269)

## 2014-10-27 ENCOUNTER — Other Ambulatory Visit: Payer: Self-pay | Admitting: Family Medicine

## 2014-10-28 ENCOUNTER — Telehealth: Payer: Self-pay | Admitting: Oncology

## 2014-10-28 NOTE — Telephone Encounter (Signed)
Confirmed appointment for December. Mailed calendar. °

## 2014-10-31 ENCOUNTER — Other Ambulatory Visit: Payer: Medicare Other

## 2014-10-31 ENCOUNTER — Ambulatory Visit: Payer: Medicare Other

## 2014-10-31 ENCOUNTER — Other Ambulatory Visit: Payer: Self-pay | Admitting: Family Medicine

## 2014-11-10 ENCOUNTER — Other Ambulatory Visit: Payer: Self-pay | Admitting: Family Medicine

## 2014-11-28 ENCOUNTER — Telehealth: Payer: Self-pay | Admitting: *Deleted

## 2014-11-28 ENCOUNTER — Ambulatory Visit: Payer: Medicare Other

## 2014-11-28 ENCOUNTER — Other Ambulatory Visit: Payer: Medicare Other

## 2014-11-28 NOTE — Telephone Encounter (Signed)
Called patient and left message to call back and reschedule appointments.

## 2014-11-29 ENCOUNTER — Telehealth: Payer: Self-pay | Admitting: Oncology

## 2014-11-29 NOTE — Telephone Encounter (Signed)
Returned patients call to reschedule her missed appointment.

## 2014-12-01 ENCOUNTER — Ambulatory Visit: Payer: Medicare Other

## 2014-12-01 ENCOUNTER — Other Ambulatory Visit (HOSPITAL_BASED_OUTPATIENT_CLINIC_OR_DEPARTMENT_OTHER): Payer: Medicare Other

## 2014-12-01 DIAGNOSIS — D649 Anemia, unspecified: Secondary | ICD-10-CM

## 2014-12-01 DIAGNOSIS — D509 Iron deficiency anemia, unspecified: Secondary | ICD-10-CM

## 2014-12-01 LAB — CBC WITH DIFFERENTIAL/PLATELET
BASO%: 0.5 % (ref 0.0–2.0)
Basophils Absolute: 0 10*3/uL (ref 0.0–0.1)
EOS%: 1.1 % (ref 0.0–7.0)
Eosinophils Absolute: 0.1 10*3/uL (ref 0.0–0.5)
HEMATOCRIT: 35.8 % (ref 34.8–46.6)
HGB: 10.9 g/dL — ABNORMAL LOW (ref 11.6–15.9)
LYMPH%: 28.2 % (ref 14.0–49.7)
MCH: 23.6 pg — ABNORMAL LOW (ref 25.1–34.0)
MCHC: 30.4 g/dL — ABNORMAL LOW (ref 31.5–36.0)
MCV: 77.6 fL — AB (ref 79.5–101.0)
MONO#: 0.7 10*3/uL (ref 0.1–0.9)
MONO%: 10.7 % (ref 0.0–14.0)
NEUT#: 3.7 10*3/uL (ref 1.5–6.5)
NEUT%: 59.5 % (ref 38.4–76.8)
Platelets: 191 10*3/uL (ref 145–400)
RBC: 4.61 10*6/uL (ref 3.70–5.45)
RDW: 13.9 % (ref 11.2–14.5)
WBC: 6.2 10*3/uL (ref 3.9–10.3)
lymph#: 1.7 10*3/uL (ref 0.9–3.3)

## 2014-12-01 NOTE — Progress Notes (Signed)
B 10.9 today   Will not get Aranesp injection today

## 2014-12-02 LAB — FERRITIN CHCC: Ferritin: 135 ng/ml (ref 9–269)

## 2014-12-04 ENCOUNTER — Other Ambulatory Visit: Payer: Self-pay | Admitting: Oncology

## 2014-12-05 ENCOUNTER — Ambulatory Visit: Payer: Medicare Other

## 2014-12-05 ENCOUNTER — Other Ambulatory Visit: Payer: Medicare Other

## 2014-12-05 ENCOUNTER — Telehealth: Payer: Self-pay | Admitting: Oncology

## 2014-12-05 NOTE — Telephone Encounter (Signed)
lvm fo rpt regarding to todays appt... °

## 2014-12-13 ENCOUNTER — Other Ambulatory Visit: Payer: Self-pay | Admitting: Family Medicine

## 2014-12-29 ENCOUNTER — Other Ambulatory Visit: Payer: Self-pay | Admitting: Family Medicine

## 2014-12-30 ENCOUNTER — Other Ambulatory Visit: Payer: Self-pay | Admitting: *Deleted

## 2014-12-30 DIAGNOSIS — D6489 Other specified anemias: Secondary | ICD-10-CM

## 2015-01-02 ENCOUNTER — Ambulatory Visit: Payer: Medicare Other

## 2015-01-02 ENCOUNTER — Other Ambulatory Visit: Payer: Medicare Other

## 2015-01-19 ENCOUNTER — Telehealth: Payer: Self-pay | Admitting: Family Medicine

## 2015-01-19 NOTE — Telephone Encounter (Signed)
Pt called to update her address and to let us know that she has moved out of state. She will be finding a doctor in the area where she lives now.

## 2015-01-26 ENCOUNTER — Telehealth: Payer: Self-pay | Admitting: Oncology

## 2015-01-26 NOTE — Telephone Encounter (Signed)
Patient called to cancel all appointments moved to [facility]

## 2015-01-27 ENCOUNTER — Other Ambulatory Visit: Payer: Self-pay | Admitting: Family Medicine

## 2015-01-30 ENCOUNTER — Ambulatory Visit: Payer: Medicare Other

## 2015-01-30 ENCOUNTER — Other Ambulatory Visit: Payer: Medicare Other

## 2015-01-31 ENCOUNTER — Other Ambulatory Visit: Payer: Self-pay | Admitting: Family Medicine

## 2015-02-27 ENCOUNTER — Other Ambulatory Visit: Payer: Medicare Other

## 2015-02-27 ENCOUNTER — Ambulatory Visit: Payer: Medicare Other

## 2015-03-11 ENCOUNTER — Other Ambulatory Visit: Payer: Self-pay | Admitting: Family Medicine

## 2015-03-27 ENCOUNTER — Other Ambulatory Visit: Payer: Medicare Other

## 2015-03-27 ENCOUNTER — Ambulatory Visit: Payer: Medicare Other

## 2015-04-01 ENCOUNTER — Other Ambulatory Visit: Payer: Self-pay | Admitting: Nurse Practitioner

## 2015-04-24 ENCOUNTER — Ambulatory Visit: Payer: Medicare Other

## 2015-04-24 ENCOUNTER — Other Ambulatory Visit: Payer: Medicare Other

## 2015-04-27 ENCOUNTER — Ambulatory Visit: Payer: Medicare Other | Admitting: Oncology

## 2015-04-27 ENCOUNTER — Other Ambulatory Visit: Payer: Medicare Other

## 2015-05-01 ENCOUNTER — Other Ambulatory Visit: Payer: Self-pay | Admitting: Family Medicine

## 2015-05-03 ENCOUNTER — Other Ambulatory Visit: Payer: Self-pay | Admitting: Nurse Practitioner

## 2015-05-22 ENCOUNTER — Ambulatory Visit: Payer: Medicare Other

## 2015-05-22 ENCOUNTER — Other Ambulatory Visit: Payer: Medicare Other

## 2015-11-21 ENCOUNTER — Emergency Department: Admit: 2015-11-21 | Payer: MEDICARE | Primary: Internal Medicine

## 2015-11-21 ENCOUNTER — Inpatient Hospital Stay
Admit: 2015-11-21 | Discharge: 2015-11-24 | Disposition: A | Discharge status: Home / Self Care | Payer: MEDICARE | Attending: Family Medicine | Admitting: Family Medicine

## 2015-11-21 DIAGNOSIS — A419 Sepsis, unspecified organism: Principal | ICD-10-CM

## 2015-11-21 LAB — CBC WITH AUTOMATED DIFF
ABS. BASOPHILS: 0 10*3/uL (ref 0.0–0.06)
ABS. EOSINOPHILS: 0 10*3/uL (ref 0.0–0.4)
ABS. LYMPHOCYTES: 0.9 10*3/uL (ref 0.9–3.6)
ABS. MONOCYTES: 0.8 10*3/uL (ref 0.05–1.2)
ABS. NEUTROPHILS: 7.6 10*3/uL (ref 1.8–8.0)
BASOPHILS: 0 % (ref 0–2)
EOSINOPHILS: 0 % (ref 0–5)
HCT: 33.5 % — ABNORMAL LOW (ref 35.0–45.0)
HGB: 10.7 g/dL — ABNORMAL LOW (ref 12.0–16.0)
LYMPHOCYTES: 10 % — ABNORMAL LOW (ref 21–52)
MCH: 24.4 PG (ref 24.0–34.0)
MCHC: 31.9 g/dL (ref 31.0–37.0)
MCV: 76.3 FL (ref 74.0–97.0)
MONOCYTES: 8 % (ref 3–10)
MPV: 10.1 FL (ref 9.2–11.8)
NEUTROPHILS: 82 % — ABNORMAL HIGH (ref 40–73)
PLATELET: 219 10*3/uL (ref 135–420)
RBC: 4.39 M/uL (ref 4.20–5.30)
RDW: 14.4 % (ref 11.6–14.5)
WBC: 9.3 10*3/uL (ref 4.6–13.2)

## 2015-11-21 LAB — METABOLIC PANEL, COMPREHENSIVE
A-G Ratio: 1.1 (ref 0.8–1.7)
ALT (SGPT): 22 U/L (ref 13–56)
AST (SGOT): 15 U/L (ref 15–37)
Albumin: 4.4 g/dL (ref 3.4–5.0)
Alk. phosphatase: 54 U/L (ref 45–117)
Anion gap: 10 mmol/L (ref 3.0–18)
BUN/Creatinine ratio: 24 — ABNORMAL HIGH (ref 12–20)
BUN: 22 MG/DL — ABNORMAL HIGH (ref 7.0–18)
Bilirubin, total: 0.7 MG/DL (ref 0.2–1.0)
CO2: 29 mmol/L (ref 21–32)
Calcium: 9.4 MG/DL (ref 8.5–10.1)
Chloride: 98 mmol/L — ABNORMAL LOW (ref 100–108)
Creatinine: 0.92 MG/DL (ref 0.6–1.3)
GFR est AA: >60 mL/min/{1.73_m2} (ref >60)
GFR est non-AA: 57 mL/min/{1.73_m2} — ABNORMAL LOW (ref >60)
Globulin: 4 g/dL (ref 2.0–4.0)
Glucose: 199 mg/dL — ABNORMAL HIGH (ref 74–99)
Potassium: 3.4 mmol/L — ABNORMAL LOW (ref 3.5–5.5)
Protein, total: 8.4 g/dL — ABNORMAL HIGH (ref 6.4–8.2)
Sodium: 137 mmol/L (ref 136–145)

## 2015-11-21 LAB — CARDIAC PANEL,(CK, CKMB & TROPONIN)
CK - MB: <1 ng/ml (ref <3.6)
CK - MB: 1 ng/ml (ref <3.6)
CK - MB: 2 ng/ml (ref <3.6)
CK-MB Index: 1.6 % (ref 0.0–4.0)
CK-MB Index: 2.5 % (ref 0.0–4.0)
CK: 74 U/L (ref 26–192)
CK: 64 U/L (ref 26–192)
CK: 81 U/L (ref 26–192)
Troponin-I, QT: 0.02 NG/ML (ref 0.0–0.045)
Troponin-I, QT: 0.05 NG/ML — ABNORMAL HIGH (ref 0.0–0.045)
Troponin-I, QT: 0.07 NG/ML — ABNORMAL HIGH (ref 0.0–0.045)

## 2015-11-21 LAB — MAGNESIUM: Magnesium: 1.5 mg/dL — ABNORMAL LOW (ref 1.6–2.6)

## 2015-11-21 LAB — URINALYSIS W/ RFLX MICROSCOPIC
Bilirubin: NEGATIVE
Glucose: 100 mg/dL — AB
Ketone: 15 mg/dL — AB
Nitrites: POSITIVE — AB
Specific gravity: 1.011 (ref 1.005–1.030)
Urobilinogen: 0.2 EU/dL (ref 0.2–1.0)
pH (UA): 5.5 (ref 5.0–8.0)

## 2015-11-21 LAB — URINE MICROSCOPIC ONLY

## 2015-11-21 LAB — GLUCOSE, POC: Glucose (POC): 124 mg/dL — ABNORMAL HIGH (ref 70–110)

## 2015-11-21 LAB — NT-PRO BNP: NT pro-BNP: 828 PG/ML (ref 0–1800)

## 2015-11-21 LAB — LACTIC ACID: Lactic acid: 2.3 MMOL/L — CR (ref 0.4–2.0)

## 2015-11-21 MED ORDER — HEPARIN (PORCINE) 5,000 UNIT/ML IJ SOLN
5000 unit/mL | Freq: Three times a day (TID) | INTRAMUSCULAR | Status: DC
Start: 2015-11-21 — End: 2015-11-24
  Administered 2015-11-21 – 2015-11-24 (×9): via SUBCUTANEOUS

## 2015-11-21 MED ORDER — INSULIN LISPRO 100 UNIT/ML INJECTION
100 unit/mL | Freq: Four times a day (QID) | SUBCUTANEOUS | Status: DC
Start: 2015-11-21 — End: 2015-11-24
  Administered 2015-11-22 – 2015-11-24 (×7): via SUBCUTANEOUS

## 2015-11-21 MED ORDER — CEFTRIAXONE 1 GRAM SOLUTION FOR INJECTION
1 gram | INTRAMUSCULAR | Status: AC
Start: 2015-11-21 — End: 2015-11-21
  Administered 2015-11-21: 16:00:00 via INTRAVENOUS

## 2015-11-21 MED ORDER — PIPERACILLIN-TAZOBACTAM 2.25 GRAM IV SOLR
2.25 gram | Freq: Four times a day (QID) | INTRAVENOUS | Status: DC
Start: 2015-11-21 — End: 2015-11-23
  Administered 2015-11-21 – 2015-11-23 (×7): via INTRAVENOUS

## 2015-11-21 MED ORDER — AMLODIPINE 10 MG TAB
10 mg | Freq: Every day | ORAL | Status: DC
Start: 2015-11-21 — End: 2015-11-24
  Administered 2015-11-22 – 2015-11-24 (×3): via ORAL

## 2015-11-21 MED ORDER — SODIUM CHLORIDE 0.9 % IV
INTRAVENOUS | Status: DC
Start: 2015-11-21 — End: 2015-11-23
  Administered 2015-11-21 – 2015-11-23 (×3): via INTRAVENOUS

## 2015-11-21 MED ORDER — MAGNESIUM SULFATE 2 GRAM/50 ML IVPB
2 gram/50 mL (4 %) | INTRAVENOUS | Status: AC
Start: 2015-11-21 — End: 2015-11-21
  Administered 2015-11-21 (×2): via INTRAVENOUS

## 2015-11-21 MED ORDER — GLUCAGON 1 MG INJECTION
1 mg | INTRAMUSCULAR | Status: DC | PRN
Start: 2015-11-21 — End: 2015-11-24

## 2015-11-21 MED ORDER — LISINOPRIL-HYDROCHLOROTHIAZIDE 20 MG-12.5 MG TAB
Freq: Every day | ORAL | Status: DC
Start: 2015-11-21 — End: 2015-11-24
  Administered 2015-11-22: 13:00:00 via ORAL

## 2015-11-21 MED ORDER — METOPROLOL SUCCINATE SR 50 MG 24 HR TAB
50 mg | Freq: Every day | ORAL | Status: DC
Start: 2015-11-21 — End: 2015-11-24
  Administered 2015-11-22: 13:00:00 via ORAL

## 2015-11-21 MED ORDER — SODIUM CHLORIDE 0.9% BOLUS IV
0.9 % | Freq: Once | INTRAVENOUS | Status: AC
Start: 2015-11-21 — End: 2015-11-21
  Administered 2015-11-21: 14:00:00 via INTRAVENOUS

## 2015-11-21 MED ORDER — DEXTROSE 50% IN WATER (D50W) IV SYRG
INTRAVENOUS | Status: DC | PRN
Start: 2015-11-21 — End: 2015-11-24

## 2015-11-21 MED ORDER — FERROUS SULFATE 325 MG (65 MG ELEMENTAL IRON) TAB
325 mg (65 mg iron) | Freq: Three times a day (TID) | ORAL | Status: DC
Start: 2015-11-21 — End: 2015-11-24
  Administered 2015-11-21 – 2015-11-24 (×9): via ORAL

## 2015-11-21 MED ORDER — GLUCOSE 4 GRAM CHEWABLE TAB
4 gram | ORAL | Status: DC | PRN
Start: 2015-11-21 — End: 2015-11-24

## 2015-11-21 MED ORDER — ONDANSETRON 4 MG TAB, RAPID DISSOLVE
4 mg | ORAL | Status: DC | PRN
Start: 2015-11-21 — End: 2015-11-24

## 2015-11-21 MED FILL — MAGNESIUM SULFATE 2 GRAM/50 ML IVPB: 2 gram/50 mL (4 %) | INTRAVENOUS | Qty: 50

## 2015-11-21 MED FILL — HEPARIN (PORCINE) 5,000 UNIT/ML IJ SOLN: 5000 unit/mL | INTRAMUSCULAR | Qty: 1

## 2015-11-21 MED FILL — PIPERACILLIN-TAZOBACTAM 2.25 GRAM IV SOLR: 2.25 gram | INTRAVENOUS | Qty: 2.25

## 2015-11-21 MED FILL — SODIUM CHLORIDE 0.9 % IV: INTRAVENOUS | Qty: 1000

## 2015-11-21 MED FILL — FERROUS SULFATE 325 MG (65 MG ELEMENTAL IRON) TAB: 325 mg (65 mg iron) | ORAL | Qty: 1

## 2015-11-21 MED FILL — CEFTRIAXONE 1 GRAM SOLUTION FOR INJECTION: 1 gram | INTRAMUSCULAR | Qty: 1

## 2015-11-21 NOTE — Progress Notes (Signed)
Chaplain conducted an initial consultation and Spiritual Assessment for [name] Juarez, who is a 82 y.o.,female. Patient???s Primary Language is: [place].   According to the patient???s EMR Religious Affiliation is: [place].     The reason the Patient came to the hospital is:   Patient Active Problem List    Diagnosis Date Noted   ??? Hypomagnesemia 11/21/2015   ??? Chest pain 11/21/2015   ??? Weakness 11/21/2015   ??? Sepsis ([place]) 11/21/2015        The Chaplain provided the following Interventions:  Initiated a relationship of care and support.   Listened empathically.  Provided chaplaincy education.  Provided information about Spiritual Care Services.  Offered prayer and assurance of continued prayers on patient's behalf.   Chart reviewed.    The following outcomes where achieved:  Patient shared limited information about both their medical narrative and spiritual journey/beliefs.  Patient processed feeling about current hospitalization.  Chaplain assured patient of continued prayers.  Chaplain left spiritual care kit with patient.  Patient expressed gratitude for chaplain's visit.    Assessment:  Patient does not have any religious/cultural needs that will affect patient???s preferences in health care.  There are no spiritual or religious issues which require intervention at this time.     Patient is hard of hearing so speak up when speaking to her.    Plan:  Chaplains will continue to follow and will provide pastoral care on an as needed/requested basis.  Chaplain recommends bedside caregivers page chaplain on duty if patient shows signs of acute spiritual or emotional distress.    [place]   [phone]

## 2015-11-21 NOTE — Other (Signed)
Report given to [place]ad[place], [place]

## 2015-11-21 NOTE — Other (Signed)
Bedside and Verbal shift change report given to Dennie FettersLucy Sumulong (oncoming nurse) by Nigel BertholdZadie M [place]en (offgoing nurse). Report included the following information SBAR, Kardex, MAR and Recent Results.    SITUATION:   ? Code Status: Full Code  ? Reason for Admission: Chest pain  ? Hypomagnesemia  ? Weakness  ? Chest pain    ? Hospital day: 0  ? Problem List:       Hospital Problems  Never Reviewed          Codes Class Noted POA    Hypomagnesemia ICD-10-CM: E83.42  ICD-9-CM: 275.2  11/21/2015 Unknown        Chest pain ICD-10-CM: R07.9  ICD-9-CM: 786.50  11/21/2015 Unknown        Weakness ICD-10-CM: R53.1  ICD-9-CM: 780.79  11/21/2015 Unknown        * (Principal)Sepsis (HCC) ICD-10-CM: A41.9  ICD-9-CM: 038.9, 995.91  11/21/2015 Yes              BACKGROUND:    Past Medical History:   Past Medical History:   Diagnosis Date   ??? Arthritis    ??? Diabetes (HCC)    ??? Hypertension          Patient taking anticoagulants yes     ASSESSMENT:   ? Changes in Assessment Throughout Shift: Stable      ? Patient has Central Line: no Reasons if yes:   ? Patient has Foley Cath: no Reasons if yes:      ? Last Vitals:     Vitals:    11/21/15 1300 11/21/15 1305 11/21/15 1409 11/21/15 1630   BP: 128/52  173/68 149/63   Pulse:  89 (!) 103 78   Resp:  12 14 16    Temp:   97.5 ??F (36.4 ??C) 98.1 ??F (36.7 ??C)   SpO2: 100% 100% 100% 97%   Weight:       Height:           ? IV and DRAINS (will only show if present)   Peripheral IV 11/21/15 Left Forearm-Site Assessment: Clean, dry, & intact    ? WOUND (if present)   Wound Type:  none   Dressing present Dressing Present : No   Wound Concerns/Notes:      ? PAIN    Pain Assessment    Pain Intensity 1: 0 (11/21/15 1728)              Patient Stated Pain Goal: 0  o Interventions for Pain:  none  o Intervention effective:   o Time of last intervention:    o Reassessment Completed: yes     ? Last 3 Weights:  Last 3 Recorded Weights in this Encounter    11/21/15 0940   Weight: 38.1 kg (84 lb)     Weight change:      ? INTAKE/OUPUT    Current Shift:      Last three shifts:      ? LAB RESULTS     Recent Labs      11/21/15   0935   WBC  9.3   HGB  10.7*   HCT  33.5*   PLT  219        Recent Labs      11/21/15   0935   NA  137   K  3.4*   GLU  199*   BUN  22*   CREA  0.92   CA  9.4   MG  1.5*  RECOMMENDATIONS AND DISCHARGE PLANNING     1. Pending tests/procedures/ Plan of Care or Other Needs: none at this time     2. Discharge plan for patient and Needs/Barriers:     3. Estimated Discharge Date: 11/23/15 Posted on Whiteboard in Patient???s Room: yes      4. The patient's care plan was reviewed with the oncoming nurse.       "HEALS" SAFETY CHECK      Fall Risk    Total Score: 4    Safety Measures:      A safety check occurred in the patient's room between off going nurse and oncoming nurse listed above.    The safety check included the below items  Area Items   H  High Alert Medications ? Verify all high alert medication drips (heparin, PCA, etc.)   E  Equipment ? Suction is set up for ALL patients (with yanker)  ? Red plugs utilized for all equipment (IV pumps, etc.)  ? WOW???s wiped down at end of shift.  ? Room stocked with oxygen, suction, and other unit-specific supplies   A  Alarms ? Bed alarm is set for fall risk patients  ? Ensure chair alarm is in place and activated if patient is up in a chair   L  Lines ? Check IV for any infiltration  ? Foley bag is empty if patient has a Foley   ? Tubing and IV bags are labeled   S  Safety   ? Room is clean, patient is clean, and equipment is clean.  ? Hallways are clear from equipment besides carts.   ? Fall bracelet on for fall risk patients  ? Ensure room is clear and free of clutter  ? Suction is set up for ALL patients (with yanker)  ? Hallways are clear from equipment besides carts.   ? Isolation precautions followed, supplies available outside room, sign posted     Nigel Berthold Ermen

## 2015-11-21 NOTE — Other (Addendum)
Received patient in bed, resting, arousable. IV fluid infusing. Not in acute distress, will continue to monitor.  8:20 PM  MD informed of elevated lactic acid. No orders.

## 2015-11-21 NOTE — ED Triage Notes (Signed)
Per [facility] #4 Pt at Drs office for weight loss Pt reports chest pain to ems staff.

## 2015-11-21 NOTE — ED Notes (Signed)
[facility] at bedside evaluating.

## 2015-11-21 NOTE — Med Student Progress Note (Signed)
*ATTENTION:  This note has been created by a medical student for educational purposes only.  Please do not refer to the content of this note for clinical decision-making, billing, or other purposes.  Please see attending physician???s note to obtain clinical information on this patient.*         Admission History and Physical  [facility]      Patient: Germani Gavilanes MRN: [id number]  CSN: [id number]    Date of Birth: 1922/03/20  Age: 82 y.o.  Sex: female      DOA: 11/21/2015       HPI:     Shanita Kanan is a 82 y.o. female with PMH Type II Diabetes Mellitus and Hypertension, now presenting with complaint of vomiting      ED Course:   Vomiting: The patient reports that she has had frequent vomiting after meals for the last month, but that it has gotten worse since Sunday. She denies hematemesis. She is unable to keep anything but water down and has not eaten a real meal since Saturday. She has not taken her medication this morning but has been taking it regularly until today. The patient lives with her daughter, who is here today and says that up until today the patient has been able to perform activities of daily living (cooking, going to the bathroom, walking around the house) without assistance.       Review of Systems   Constitutional: Positive for weight loss. Negative for chills and fever.   Respiratory: Negative for cough, shortness of breath and wheezing.    Cardiovascular: Positive for chest pain and palpitations. Negative for leg swelling.   Gastrointestinal: Positive for nausea and vomiting. Negative for abdominal pain, blood in stool, constipation and diarrhea.   Genitourinary: Positive for frequency and urgency. Negative for dysuria and hematuria.   Musculoskeletal: Positive for falls.   Skin: Negative for rash.   Neurological: Positive for headaches. Negative for dizziness.          Past Medical History:   Diagnosis Date   ??? Arthritis    ??? Diabetes ([place])    ??? Hypertension         Past Surgical History:   Procedure Laterality Date   ??? HX GYN      hysterectomy       History reviewed. No pertinent family history.    Social History     Social History   ??? Marital status: SINGLE     Spouse name: N/A   ??? Number of children: N/A   ??? Years of education: N/A     Social History Main Topics   ??? Smoking status: Never Smoker   ??? Smokeless tobacco: Never Used   ??? Alcohol use No   ??? Drug use: None   ??? Sexual activity: Not Currently     Other Topics Concern   ??? None     Social History Narrative   ??? None       No Known Allergies    Prior to Admission Medications   Prescriptions Last Dose Informant Patient Reported? Taking?   amLODIPine-atorvastatin (CADUET) 10-40 mg per tablet 11/20/2015 at 0800  Yes Yes   Sig: Take 1 Tab by mouth daily.   cloNIDine HCl (CATAPRES) 0.1 mg tablet 11/20/2015 at 2000  Yes Yes   Sig: Take 0.1 mg by mouth two (2) times a day.   ferrous sulfate (SLOW FE) 142 mg (45 mg iron) ER tablet 11/20/2015 at 0800  Yes Yes  Sig: Take 142 mg by mouth Daily (before breakfast).   lisinopril-hydroCHLOROthiazide (PRINZIDE, ZESTORETIC) 20-12.5 mg per tablet 11/20/2015 at 0800  Yes Yes   Sig: Take 2 Tabs by mouth daily.   metoprolol succinate (TOPROL-XL) 100 mg tablet 11/20/2015 at 0800  Yes Yes   Sig: Take 100 mg by mouth daily.   pioglitazone-metFORMIN (ACTOPLUS MET) 15-850 mg per tablet 11/20/2015 at 1800  Yes Yes   Sig: Take 1 Tab by mouth two (2) times daily (with meals).      Facility-Administered Medications: None       Physical Exam:     Patient Vitals for the past 24 hrs:   Temp Pulse Resp BP SpO2   11/21/15 1305 - 89 12 - 100 %   11/21/15 1300 - - - 128/52 100 %   11/21/15 1245 - - - 161/88 -   11/21/15 1145 - 99 15 143/50 100 %   11/21/15 1130 - 99 16 139/61 100 %   11/21/15 1100 - 87 22 134/49 98 %   11/21/15 1045 - 88 20 143/51 -   11/21/15 1015 - 95 19 144/51 100 %   11/21/15 1000 - 90 16 150/53 99 %   11/21/15 0937 97.6 ??F (36.4 ??C) (!) 101 20 - 100 %   11/21/15 0936 - 99 24 147/50 -    11/21/15 0927 - 100 17 153/53 (!) 78 %       Physical Exam   Eyes: Conjunctivae are normal.   Neck: No thyromegaly present.   Cardiovascular: Normal rate and regular rhythm.  Exam reveals no gallop and no friction rub.    Murmur heard.  Pulses:       Carotid pulses are 2+ on the right side, and 2+ on the left side.  Pulmonary/Chest: Effort normal and breath sounds normal. She has no wheezes. She has no rales. She exhibits no tenderness.   Abdominal: She exhibits no distension. There is no tenderness. There is no CVA tenderness.   Neurological:   Reflex Scores:       Brachioradialis reflexes are 1+ on the right side and 1+ on the left side.       Patellar reflexes are 1+ on the right side and 1+ on the left side.         IMAGING:     11/21/15 at 10:26am Portable erect AP view of the chest provided to evaluate weakness, with no  priors to compare  ??  There are monitoring leads on the chest and upper abdomen. The lungs appear well  aerated and free of infiltrate. No pleural effusions are seen. Cardiomediastinal  silhouette and heart size are normal. Bony structures are unremarkable.  ??  IMPRESSION  IMPRESSION: No active cardiopulmonary disease    Recent Results (from the past 12 hour(s))   EKG, 12 LEAD, INITIAL    Collection Time: 11/21/15  9:26 AM   Result Value Ref Range    Ventricular Rate 114 BPM    Atrial Rate 114 BPM    P-R Interval 126 ms    QRS Duration 74 ms    Q-T Interval 324 ms    QTC Calculation (Bezet) 446 ms    Calculated P Axis 65 degrees    Calculated R Axis 31 degrees    Calculated T Axis -121 degrees    Diagnosis       Sinus tachycardia with premature supraventricular complexes  Cannot rule out Anterior infarct , age undetermined  ST & T wave  abnormality, consider inferolateral ischemia  Abnormal ECG  No previous ECGs available     CBC WITH AUTOMATED DIFF    Collection Time: 11/21/15  9:35 AM   Result Value Ref Range    WBC 9.3 4.6 - 13.2 K/uL    RBC 4.39 4.20 - 5.30 M/uL     HGB 10.7 (L) 12.0 - 16.0 g/dL    HCT 33.5 (L) 35.0 - 45.0 %    MCV 76.3 74.0 - 97.0 FL    MCH 24.4 24.0 - 34.0 PG    MCHC 31.9 31.0 - 37.0 g/dL    RDW 14.4 11.6 - 14.5 %    PLATELET 219 135 - 420 K/uL    MPV 10.1 9.2 - 11.8 FL    NEUTROPHILS 82 (H) 40 - 73 %    LYMPHOCYTES 10 (L) 21 - 52 %    MONOCYTES 8 3 - 10 %    EOSINOPHILS 0 0 - 5 %    BASOPHILS 0 0 - 2 %    ABS. NEUTROPHILS 7.6 1.8 - 8.0 K/UL    ABS. LYMPHOCYTES 0.9 0.9 - 3.6 K/UL    ABS. MONOCYTES 0.8 0.05 - 1.2 K/UL    ABS. EOSINOPHILS 0.0 0.0 - 0.4 K/UL    ABS. BASOPHILS 0.0 0.0 - 0.06 K/UL    DF AUTOMATED     METABOLIC PANEL, COMPREHENSIVE    Collection Time: 11/21/15  9:35 AM   Result Value Ref Range    Sodium 137 136 - 145 mmol/L    Potassium 3.4 (L) 3.5 - 5.5 mmol/L    Chloride 98 (L) 100 - 108 mmol/L    CO2 29 21 - 32 mmol/L    Anion gap 10 3.0 - 18 mmol/L    Glucose 199 (H) 74 - 99 mg/dL    BUN 22 (H) 7.0 - 18 MG/DL    Creatinine 0.92 0.6 - 1.3 MG/DL    BUN/Creatinine ratio 24 (H) 12 - 20      GFR est AA >60 >60 ml/min/1.48m    GFR est non-AA 57 (L) >60 ml/min/1.772m   Calcium 9.4 8.5 - 10.1 MG/DL    Bilirubin, total 0.7 0.2 - 1.0 MG/DL    ALT (SGPT) 22 13 - 56 U/L    AST (SGOT) 15 15 - 37 U/L    Alk. phosphatase 54 45 - 117 U/L    Protein, total 8.4 (H) 6.4 - 8.2 g/dL    Albumin 4.4 3.4 - 5.0 g/dL    Globulin 4.0 2.0 - 4.0 g/dL    A-G Ratio 1.1 0.8 - 1.7     MAGNESIUM    Collection Time: 11/21/15  9:35 AM   Result Value Ref Range    Magnesium 1.5 (L) 1.6 - 2.6 mg/dL   CARDIAC PANEL,(CK, CKMB & TROPONIN)    Collection Time: 11/21/15  9:35 AM   Result Value Ref Range    CK 74 26 - 192 U/L    CK - MB <1.0 <3.6 ng/ml    CK-MB Index  0.0 - 4.0 %     CALCULATION NOT PERFORMED WHEN RESULT IS BELOW LINEAR LIMIT    Troponin-I, Qt. 0.02 0.0 - 0.045 NG/ML   URINALYSIS W/ RFLX MICROSCOPIC    Collection Time: 11/21/15 10:44 AM   Result Value Ref Range    Color YELLOW      Appearance CLEAR      Specific gravity 1.011 1.005 - 1.030      pH (UA)  5.5 5.0 - 8.0       Protein TRACE (A) NEG mg/dL    Glucose 100 (A) NEG mg/dL    Ketone 15 (A) NEG mg/dL    Bilirubin NEGATIVE  NEG      Blood TRACE (A) NEG      Urobilinogen 0.2 0.2 - 1.0 EU/dL    Nitrites POSITIVE (A) NEG      Leukocyte Esterase LARGE (A) NEG     URINE MICROSCOPIC ONLY    Collection Time: 11/21/15 10:44 AM   Result Value Ref Range    WBC TOO NUMEROUS TO COUNT 0 - 4 /hpf    Epithelial cells FEW 0 - 5 /lpf    Bacteria 3+ (A) NEG /hpf   EKG, 12 LEAD, SUBSEQUENT    Collection Time: 11/21/15  1:22 PM   Result Value Ref Range    Ventricular Rate 89 BPM    Atrial Rate 89 BPM    P-R Interval 124 ms    QRS Duration 70 ms    Q-T Interval 350 ms    QTC Calculation (Bezet) 425 ms    Calculated P Axis 70 degrees    Calculated R Axis 37 degrees    Calculated T Axis -96 degrees    Diagnosis       Sinus rhythm with premature atrial complexes  ST & T wave abnormality, consider inferolateral ischemia  Abnormal ECG  When compared with ECG of 21-Nov-2015 09:26,  T wave inversion less evident in Lateral leads           Assessment/Plan:   82 y.o. female with PMH Diabetes Mellitus Type II and Hypertension, now admitted with vomiting and a urinary tract infection.    Start patient on Ciprofloxacin 400 mg IV q12h for 7-14 days or until cultures return showing antibiotic resistance. Administer Zofran as needed to help with nausea and vomiting.        Natale Milch  11/21/15 2:52pm

## 2015-11-21 NOTE — H&P (Signed)
Admission History and Physical  [facility]      Patient: Apphia Cropley MRN: [id number]  CSN: [id number]    Date of Birth: Jul 22, 1921  Age: 82 y.o.  Sex: female      DOA: 11/21/2015       HPI:     Samon Dishner is a 82 y.o. female with PMH hypertension, type 2 DM B/L mixed and sensorineural hearing loss, now presenting with nausea and vomitting. The patient was interviewed with her daughter. They note over the past three weeks the patient has become nauseous and vomited after meals, her symptoms improved last week but returned by Sunday. She denied hematemesis.     Endorsed some pressure like chest pain, that quickly resolved after taking tramadol earlier today with no associated symptoms. She has had episodes of similar pressure that resolve with tramadol. Endorses DOE, denies SOB at rest.     Patient also reports urinary urgency and frequency. Endorsed weakness, denied fever and chills. Please see ROS for further information.     Please note that the patient is very hard of hearing. She did not remember the examiner on second interview. She has lost 18 pounds over the last 3 months.     ED Course: In the ED, EKG was performed which was indicative of T wave inversion in inferior and lateral leads, troponin 0.02 at that time. UA with + LE and nitrates with 3+ bacteria. CXR with no acute disease. She received 1L NS bolus, 2g Mg repletion and ceftriaxone 1 g.     Review of Systems   Constitutional: Positive for weight loss. Negative for chills and fever.   Eyes: Negative for blurred vision.   Respiratory: Negative for cough, sputum production, shortness of breath and wheezing.         DOE   Cardiovascular: Positive for chest pain. Negative for palpitations.        Occasional palpitations. None recently.    Gastrointestinal: Positive for heartburn, nausea and vomiting. Negative for constipation and diarrhea.   Genitourinary: Positive for frequency and urgency. Negative for dysuria  and flank pain.   Musculoskeletal: Positive for falls.        Last fall last week. Did not bruise her head.   Skin: Negative for rash.   Neurological: Positive for tremors and headaches. Negative for dizziness and tingling.   Psychiatric/Behavioral: Negative for substance abuse.        Daughter noted memory loss in the last week        Past Medical History:   Diagnosis Date   ??? Arthritis    ??? Diabetes ([place])    ??? Hypertension        Past Surgical History:   Procedure Laterality Date   ??? HX GYN      hysterectomy       History reviewed. No pertinent family history. Family history significant for: Diabetes (sister). No known family history of stroke, MI, cancer.     Social History     Social History   ??? Marital status: SINGLE     Spouse name: N/A   ??? Number of children: N/A   ??? Years of education: N/A     Social History Main Topics   ??? Smoking status: Never Smoker   ??? Smokeless tobacco: Never Used   ??? Alcohol use No   ??? Drug use: None   ??? Sexual activity: Not Currently     Other Topics Concern   ??? None  Social History Narrative   ??? None       No Known Allergies    Prior to Admission Medications   Prescriptions Last Dose Informant Patient Reported? Taking?   amLODIPine-atorvastatin (CADUET) 10-40 mg per tablet 11/21/2015 at 0800  Yes Yes   Sig: Take 1 Tab by mouth daily.   cloNIDine HCl (CATAPRES) 0.1 mg tablet 11/20/2015 at 2000  Yes Yes   Sig: Take 0.1 mg by mouth two (2) times a day.   ferrous sulfate (IRON) 325 mg (65 mg iron) EC tablet   Yes Yes   Sig: Take 325 mg by mouth three (3) times daily (with meals).   lisinopril-hydroCHLOROthiazide (PRINZIDE, ZESTORETIC) 20-12.5 mg per tablet 11/20/2015 at 0800  Yes Yes   Sig: Take 2 Tabs by mouth daily.   metoprolol succinate (TOPROL-XL) 100 mg tablet 11/20/2015 at 0800  Yes Yes   Sig: Take 100 mg by mouth daily.   pioglitazone-metFORMIN (ACTOPLUS MET) 15-850 mg per tablet 11/20/2015 at 1800  Yes Yes   Sig: Take 1 Tab by mouth two (2) times daily (with meals).    traMADol (ULTRAM) 50 mg tablet   Yes Yes   Sig: Take 50 mg by mouth every six (6) hours as needed for Pain.      Facility-Administered Medications: None       Physical Exam:     Patient Vitals for the past 24 hrs:   Temp Pulse Resp BP SpO2   11/21/15 1409 97.5 ??F (36.4 ??C) (!) 103 14 173/68 100 %   11/21/15 1305 - 89 12 - 100 %   11/21/15 1300 - - - 128/52 100 %   11/21/15 1245 - - - 161/88 -   11/21/15 1145 - 99 15 143/50 100 %   11/21/15 1130 - 99 16 139/61 100 %   11/21/15 1100 - 87 22 134/49 98 %   11/21/15 1045 - 88 20 143/51 -   11/21/15 1015 - 95 19 144/51 100 %   11/21/15 1000 - 90 16 150/53 99 %   11/21/15 0937 97.6 ??F (36.4 ??C) (!) 101 20 - 100 %   11/21/15 0936 - 99 24 147/50 -   11/21/15 0927 - 100 17 153/53 (!) 78 %       Physical Exam   Constitutional: No distress.   HENT:   Head: Normocephalic.   Eyes: Conjunctivae and EOM are normal. No scleral icterus.   Neck: No tracheal deviation present. No thyromegaly present.   Cardiovascular: Normal rate, regular rhythm and intact distal pulses.  Exam reveals no gallop and no friction rub.    No murmur heard.  Pulmonary/Chest: Effort normal and breath sounds normal. No stridor. No respiratory distress. She has no wheezes. She has no rales. She exhibits no tenderness.   Abdominal: Soft. Bowel sounds are normal. She exhibits no distension and no mass. There is no tenderness. There is no rebound and no guarding.   Genitourinary: Rectal exam shows guaiac negative stool.   Musculoskeletal: Normal range of motion. She exhibits no edema or tenderness.   Neurological: She is alert. She displays normal reflexes. She exhibits normal muscle tone.   Strength 5/5 hands B/L  4/5 feet and legs B/L   Skin: Skin is warm and dry. She is not diaphoretic. No erythema.   Psychiatric:   Could not recall the interviewer on second examination       IMAGING:     CXR 11/21/2015  No evidence of acute cardiopulmonary disease  Recent Results (from the past 12 hour(s))    EKG, 12 LEAD, INITIAL    Collection Time: 11/21/15  9:26 AM   Result Value Ref Range    Ventricular Rate 114 BPM    Atrial Rate 114 BPM    P-R Interval 126 ms    QRS Duration 74 ms    Q-T Interval 324 ms    QTC Calculation (Bezet) 446 ms    Calculated P Axis 65 degrees    Calculated R Axis 31 degrees    Calculated T Axis -121 degrees    Diagnosis       Sinus tachycardia with premature supraventricular complexes  Cannot rule out Anterior infarct , age undetermined  ST & T wave abnormality, consider inferolateral ischemia  Abnormal ECG  No previous ECGs available     CBC WITH AUTOMATED DIFF    Collection Time: 11/21/15  9:35 AM   Result Value Ref Range    WBC 9.3 4.6 - 13.2 K/uL    RBC 4.39 4.20 - 5.30 M/uL    HGB 10.7 (L) 12.0 - 16.0 g/dL    HCT 33.5 (L) 35.0 - 45.0 %    MCV 76.3 74.0 - 97.0 FL    MCH 24.4 24.0 - 34.0 PG    MCHC 31.9 31.0 - 37.0 g/dL    RDW 14.4 11.6 - 14.5 %    PLATELET 219 135 - 420 K/uL    MPV 10.1 9.2 - 11.8 FL    NEUTROPHILS 82 (H) 40 - 73 %    LYMPHOCYTES 10 (L) 21 - 52 %    MONOCYTES 8 3 - 10 %    EOSINOPHILS 0 0 - 5 %    BASOPHILS 0 0 - 2 %    ABS. NEUTROPHILS 7.6 1.8 - 8.0 K/UL    ABS. LYMPHOCYTES 0.9 0.9 - 3.6 K/UL    ABS. MONOCYTES 0.8 0.05 - 1.2 K/UL    ABS. EOSINOPHILS 0.0 0.0 - 0.4 K/UL    ABS. BASOPHILS 0.0 0.0 - 0.06 K/UL    DF AUTOMATED     METABOLIC PANEL, COMPREHENSIVE    Collection Time: 11/21/15  9:35 AM   Result Value Ref Range    Sodium 137 136 - 145 mmol/L    Potassium 3.4 (L) 3.5 - 5.5 mmol/L    Chloride 98 (L) 100 - 108 mmol/L    CO2 29 21 - 32 mmol/L    Anion gap 10 3.0 - 18 mmol/L    Glucose 199 (H) 74 - 99 mg/dL    BUN 22 (H) 7.0 - 18 MG/DL    Creatinine 0.92 0.6 - 1.3 MG/DL    BUN/Creatinine ratio 24 (H) 12 - 20      GFR est AA >60 >60 ml/min/1.47m    GFR est non-AA 57 (L) >60 ml/min/1.77m   Calcium 9.4 8.5 - 10.1 MG/DL    Bilirubin, total 0.7 0.2 - 1.0 MG/DL    ALT (SGPT) 22 13 - 56 U/L    AST (SGOT) 15 15 - 37 U/L    Alk. phosphatase 54 45 - 117 U/L     Protein, total 8.4 (H) 6.4 - 8.2 g/dL    Albumin 4.4 3.4 - 5.0 g/dL    Globulin 4.0 2.0 - 4.0 g/dL    A-G Ratio 1.1 0.8 - 1.7     MAGNESIUM    Collection Time: 11/21/15  9:35 AM   Result Value Ref Range    Magnesium 1.5 (L) 1.6 -  2.6 mg/dL   CARDIAC PANEL,(CK, CKMB & TROPONIN)    Collection Time: 11/21/15  9:35 AM   Result Value Ref Range    CK 74 26 - 192 U/L    CK - MB <1.0 <3.6 ng/ml    CK-MB Index  0.0 - 4.0 %     CALCULATION NOT PERFORMED WHEN RESULT IS BELOW LINEAR LIMIT    Troponin-I, Qt. 0.02 0.0 - 0.045 NG/ML   URINALYSIS W/ RFLX MICROSCOPIC    Collection Time: 11/21/15 10:44 AM   Result Value Ref Range    Color YELLOW      Appearance CLEAR      Specific gravity 1.011 1.005 - 1.030      pH (UA) 5.5 5.0 - 8.0      Protein TRACE (A) NEG mg/dL    Glucose 100 (A) NEG mg/dL    Ketone 15 (A) NEG mg/dL    Bilirubin NEGATIVE  NEG      Blood TRACE (A) NEG      Urobilinogen 0.2 0.2 - 1.0 EU/dL    Nitrites POSITIVE (A) NEG      Leukocyte Esterase LARGE (A) NEG     URINE MICROSCOPIC ONLY    Collection Time: 11/21/15 10:44 AM   Result Value Ref Range    WBC TOO NUMEROUS TO COUNT 0 - 4 /hpf    Epithelial cells FEW 0 - 5 /lpf    Bacteria 3+ (A) NEG /hpf   EKG, 12 LEAD, SUBSEQUENT    Collection Time: 11/21/15  1:22 PM   Result Value Ref Range    Ventricular Rate 89 BPM    Atrial Rate 89 BPM    P-R Interval 124 ms    QRS Duration 70 ms    Q-T Interval 350 ms    QTC Calculation (Bezet) 425 ms    Calculated P Axis 70 degrees    Calculated R Axis 37 degrees    Calculated T Axis -96 degrees    Diagnosis       Sinus rhythm with premature atrial complexes  ST & T wave abnormality, consider inferolateral ischemia  Abnormal ECG  When compared with ECG of 21-Nov-2015 09:26,  T wave inversion less evident in Lateral leads     CARDIAC PANEL,(CK, CKMB & TROPONIN)    Collection Time: 11/21/15  1:24 PM   Result Value Ref Range    CK 64 26 - 192 U/L    CK - MB 1.0 <3.6 ng/ml    CK-MB Index 1.6 0.0 - 4.0 %     Troponin-I, Qt. 0.05 (H) 0.0 - 0.045 NG/ML         Assessment/Plan:   82 y.o. female with PMH significant for type II DM, hypertension and mixed and sensorineural hearing loss now admitted for chest pain.    Sepsis with a source; UTI 2/4 SIRS criteria patient received ceftriaxone in the ED. HR 90+, RR 12-24. Urinalysis with +LE, nitrates and 3+ bacteria.  - Follow up urine culture  - Follow up blood culture (please note this was drawn after administration of ceftriaxone)  - NS @ 75 for replacement fluid  - Monitor I/O carefully  - Zosyn pharmacy to dose. After 24 hours of zosyn transition to levaquin.   - Probiotics for medication induced diarrhea prophylaxis    Chest pain patient reported episodes of chest pressure that resolve with tramadol. EKG in the ED had T wave inversion concerning for ischemia in the inferolateral leads. EKG 08/2015 also indicative of  T wave inversion in inferolateral leads Troponin 0.02 in the ED, 0.05 on repeat. No pain reported at this time   - Follow up repeat troponin  - If troponin continues to rise consider consult to cardiology  - Follow up pro-BNP, if increased consider echo.     Nausea/vomitting patient reports nausea and vomiting over the last three weeks  - Zofran 4 mg Q4H PRN    Controlled DM II Last A1C 6.6. Patient is taking pioglitizone-metformin 15-850 BID at home. States her blood sugars stay below 140.   - SSI  - ACHS glucose checks  - Diabetic diet    Hypertension Range of 128/52-173/68 at admission. Taking metoprolol 100 mg every day, amlodipine-atorvastatin 10-40 mg every day, lisinopril-HCTZ 20-12.5, 2 tablets every day, clonidine 0.1 mg BID.   - Continue metoprolol 100 mg every day  - Continue amlodipine 10 mg every day  - Continue lisinopril-HCTZ 20-12.5, 2 tablets every day   - Hold atorvastatin 40 mg every day  - Hold clonidine. Restart clonidine tomorrow if maintains pressure. Consider starting at 0.1 per day and tapering off clonidine after 3-7 days.      Hearing loss evaluated by [facility] audiology 02/2015, and was found to have moderately-severe to profound mixed hearing loss in the right ear and a moderate sloping to severe sensorineural hearing loss in the left ear.   - Was given CHEAR application and recommended to follow up     Microcytic anemia Hb 9.6 on last outpatient labs 11/14/2015. Hb 10.7, MCV 76.3 on admission  - Continue home ferrous sulfate delayed release TID  - Hemoccult negative    Diet: Diabetic  DVT Prophylaxis: Heparin   Code Status: DNR  Point of Contact: Shiron Whetsel [phone]    Disposition and anticipated LOS: 2+ midnights    Nelida Meuse, MD   PGY-1 [place] Resident  [facility]  11/21/15 3:58 PM              Senior Resident History and Physical  [facility]    HPI:     Georgeanna Radziewicz is a 82 y.o. female with now being admitted with complaint of chest pain.    HPI, ROS, PMH, PSH, Family Hx, Social Hx, Home medications, and allergies as above in intern H&P. Which has been reviewed in full. Additional comments to the subjective history include:    Patient endorses a 3 week history of worsening weakness, with reduced PO intake to solids (reporting that anything solid she eats she vomits backup shortly thereafter) with unintentional weight loss of 3lbs in the last week, and 18lbs since April. There is associated nausea, and NBNB vomiting. Patient reports tarry stool and is on a low dose aspirin daily. Patient has a caregiver, her daughter, who works as a Emergency planning/management officer, who is concerned that the patient is getting worse. has lost 3 lbs from last week,     Patient was assessed at [facility] today for evaluation of the above complaints, and also endorsed having morning substernal chest pain described as a dull ache, without radiation, which lasted 10 minutes which resolved with administration of tramadol this working. The chest  pain was not associated with palpitations, diaphoresis, SOB or LOC. EKG workup in clinic revealed new T wave inversions in the precordial leads so patient was transferred via EMS from  [place] to [facility] ED.  There is associated urinary frequency which is new for the patient,  without hematuria, lower abdominal pain, malodorous urine or dysuria.         Review of Systems   Constitutional: Positive for malaise/fatigue and weight loss (18lbs, in 3 months, unintentional). Negative for chills and fever.   HENT: Positive for hearing loss. Negative for congestion, ear discharge, ear pain and nosebleeds.    Eyes: Negative for blurred vision, double vision, photophobia and pain.   Respiratory: Negative for cough, hemoptysis, sputum production and shortness of breath.    Cardiovascular: Positive for chest pain. Negative for palpitations and orthopnea.   Gastrointestinal: Positive for constipation, nausea (For past month) and vomiting. Negative for abdominal pain.   Genitourinary: Positive for frequency and urgency. Negative for dysuria, flank pain and hematuria.   Musculoskeletal: Negative for back pain, joint pain, myalgias and neck pain.   Skin: Negative for itching and rash.   Neurological: Positive for weakness and headaches. Negative for dizziness, tingling and tremors.   Endo/Heme/Allergies: Negative for environmental allergies. Does not bruise/bleed easily.   Psychiatric/Behavioral: Negative for depression and suicidal ideas.       Physical Exam:     Visit Vitals   ??? BP 143/50   ??? Pulse 99   ??? Temp 97.6 ??F (36.4 ??C)   ??? Resp 15   ??? Ht 5' (1.524 m)   ??? Wt 38.1 kg (84 lb)   ??? SpO2 100%   ??? BMI 16.41 kg/m2       Physical Exam   Constitutional: Vital signs are normal. She appears cachectic. She has a sickly appearance. She appears ill. No distress.   HENT:   Head: Normocephalic and atraumatic.   Right Ear: External ear normal.   Left Ear: External ear normal.    Mouth/Throat: Oropharynx is clear and moist. Mucous membranes are pale.   Eyes: Conjunctivae and EOM are normal. Pupils are equal, round, and reactive to light. Right eye exhibits no discharge. Left eye exhibits no discharge.   Neck: Normal range of motion. Neck supple. No JVD present. No tracheal deviation present. No thyromegaly present.   Cardiovascular:  No extrasystoles are present. Tachycardia present.  Exam reveals no decreased pulses.    Murmur heard.   Systolic (radiating to carotids) murmur is present with a grade of 5/6   Pulmonary/Chest: Effort normal and breath sounds normal. No respiratory distress. She has no wheezes. She has no rales. She exhibits no tenderness.   Abdominal: Soft. She exhibits no distension and no mass. There is no tenderness. There is no rebound and no guarding. No hernia.   Musculoskeletal: Normal range of motion. She exhibits no edema, tenderness or deformity.   Neurological: She is alert. No cranial nerve deficit. Coordination normal.   Skin: Skin is warm and dry. No rash noted. She is not diaphoretic. There is pallor.   Psychiatric: She has a normal mood and affect. Her behavior is normal.   Nursing note and vitals reviewed.      Labs Reviewed    Assessment/Plan:     Chest pain with new T wave inversions in lateral leads on EKG (not present on EKG 08/18/2015 in outpatient record), initial troponins negative. Patient has history of hypertension and type II diabetes which are risk factors for ischemic heart disease  ?? FU an additional 2 sets of troponins  ?? Repeat EKG in evening  ?? Telemetry  ?? If troponins rise, patient continues to experience chest pain, has new EKG findings, or develops signs of new heart failure we will consult cardiology  Sepsis secondary to urinary tract infection, with 2/4 SIRS (tachycardia >90, RR>20) and suspected urinary tract source of infection. New onset  urinary frequency and urgency, with trace blood, positive nitrites, large  leukocyte esterase, 3+ bacteria and WBC too numerous to count on microscopy. we will order and follow blood cultures and a respiratory culture.   ?? FU urine culture  ?? Empiric treatment with IV Zosyn, will narrow therapy per culture results with likely plan to wean to Levofloxacin.   ?? IVF NS @ 75cc/hr    Malnutrition, with Decreased PO intake, may be secondary to UTI, may also be secondary to cardiac event noted above. Malnutrition with weight loss, unintentional of 18 lbs in 3 months, loss of muscle mass and subcutaneous fat, and decreased strength. Concern that patient's complaints may be presentation of a toxic metabolic encephalopathy, we will order and follow blood cultures and a respiratory culture.   ?? IP consult to nutrition services  ?? Supplement every meal, and provide snack for protein shake  ?? SLP to evaluate and treat    Hypokalemia (3.4), hypomagnesemia (1.5)   ?? Mg repleted with 2g Mg in ED  ?? Will monitor daily BMP & Mg    Hypertension. blood pressure in ED range 134/49???153/53,   ?? Vital signs per unit routine  ?? Continue home amLODIPine-atorvastatin (CADUET) 10-40 mg per tablet  ?? Continue home lisinopril-hydroCHLOROthiazide (PRINZIDE, ZESTORETIC) 20-12.5 mg per tablet, 2 Tabs by mouth daily.  ?? Continue metoprolol succinate (TOPROL-XL) 100 mg tablet every day   ?? Taper cloNIDine HCl (CATAPRES) 0.1 mg tablet Take 0.1 mg by mouth two (2) times a day as patient is 89 and the medication is on the Beer's list;      Type II diabetes mellitus. HbA1c 6.6 11/21/15 in outpaitient clinic.   ?? Discontinue home pioglitazone-metFORMIN (ACTOPLUS MET) 15-850 mg per tablet BID while in hospital   ?? Correctional scale insulin  ?? Accuchecks qACHS    Iron deficiency anemia, with Iron Profile 02/14/2015 showing TIBC 232, serum Iron 25, Iron sat 11%. Patient taking ferrous sulphate in outpatient setting. CBC today consistent with above, with HGB 10.7 and MCV 76.3.   ?? FU daily CBC  ?? Continue iron supplementation     Subjective memory loss,  ?? Mini-COG  ?? Sundowning precautions, have encouraged family to stay overnight to help orient patient in an unfamiliar setting.      This is of high complexity, a new problem, with additional work-up planned.   Reviewed & ordered clinical lab tests  Reviewed & ordered medicine tests (EKG)  Old records reviewed and summarised above.   There is high risk as the patient's presentation with chest pain, T wave inversions is concerning for an acute MI which poses threat to life       I reviewed with the resident the medical history and the resident's findings on the physical examination.  I discussed with the resident the patient's diagnosis and concur with the plan.     Jackquline Berlin MB,BS   PGY-3 [employer] Resident  [facility]  11/21/15 12:12 PM

## 2015-11-21 NOTE — Progress Notes (Signed)
Problem: Mobility Impaired (Adult and Pediatric)  Goal: *Acute Goals and Plan of Care (Insert Text)  STG???s to be addressed within 3 days:  1. Bed mobility: Supine to sit to supine S with HR for meals.  2. Activity tolerance: Tolerate up in chair 1-2 hrs for ADL???s.  3. Transfers: Sit to stand to chair S with LRAD for ADL???s.    LTG???s to be addressed within 7 days:  1. Standing/Ambulation Balance: Increase to Good with LRAD for safe transfers and gait.  2. Ambulation: Ambulate > 150 ft. S with LRAD for home mobility.  3. Patient Education: Independent with HEP for home safety.  Outcome: Progressing Towards Goal  PHYSICAL THERAPY EVALUATION     [name] (82 y.o. female)  Date: 11/21/2015  Primary Diagnosis: Chest pain  Hypomagnesemia  Weakness  Chest pain   Precautions:   Fall      ASSESSMENT :  Based on the objective data described below, the patient presents to PT with decreased functional mobility with regard to bed mobility, transfers, gait and overall tolerance for activity.  Patient admitted with chest pain, has now subsided at time of this evaluation.  Nurse, Jerold Coombe, RN cleared patient for participation with PT.  Patient is extremely hard of hearing, unsure of accuracy of details on PLOF.  Patient reports that she was independent with ADL's PTA, ambulated with SPC.  Today, patient required min A for bed mobility, HR elevated to 122 bpm with sitting EoB. Deferred standing/ambulation due to elevated HR.  Assisted patient back to bed, positioned for comfort.  Patient would benefit from PT to address above impairments and assist with discharge planning.     Patient will benefit from skilled intervention to address the above impairments.  Patient???s rehabilitation potential is considered to be Good  Factors which may influence rehabilitation potential include:            None noted           Mental ability/status           Medical condition            Home/family situation and support systems           Safety awareness           Pain tolerance/management           Other:        PLAN :  Recommendations and Planned Interventions:             Bed Mobility Training                 Neuromuscular Re-Education             Transfer Training                       Orthotic/Prosthetic Training             Gait Training                              Modalities             Therapeutic Exercises              Edema Management/Control             Therapeutic Activities              Patient and Family Training/Education  [ ]            Other (comment):     Frequency/Duration: Patient will be followed by physical therapy daily x 4-7 x week to address goals.  Discharge Recommendations: Home Health  Further Equipment Recommendations for Discharge: To be determined       SUBJECTIVE:   Patient stated ???I am okay.???      OBJECTIVE DATA SUMMARY:       Past Medical History:   Diagnosis Date   ??? Arthritis     ??? Diabetes (HCC)     ??? Hypertension       Past Surgical History:   Procedure Laterality Date   ??? HX GYN         hysterectomy     Barriers to Learning/Limitations: yes;  sensory deficits-hearing  Compensate with: Visual Cues and Tactile Cues  Prior Level of Function/Home Situation:   Home Situation  Home Environment: Private residence  # Steps to Enter: 0  One/Two Story Residence: One story  Living Alone: No  Support Systems: Child(ren)  Patient Expects to be Discharged to:: Private residence  Current DME Used/Available at Home: Gilmer Morane, straight, Environmental consultantWalker, rollator  Critical Behavior:  Neurologic State: Alert  Psychosocial  Patient Behaviors: Calm;Cooperative  Strength:    Strength: Generally decreased, functional (B UE/LE)  Tone & Sensation:   Tone: Normal (B UE/LE)  Sensation: Intact (B UE/LE intact to LT)   Range Of Motion:  AROM: Within functional limits (B UE/LE)  Functional Mobility:  Bed Mobility:   Rolling: Minimum assistance;Additional time  Supine to Sit: Minimum assistance;Additional time  Sit to Supine: Minimum assistance;Additional time  Scooting: Minimum assistance;Additional time  Transfers:  Sit to Stand:  (N/A-due to elevated HR with sitting EoB)  Balance:   Sitting: Impaired;With support  Sitting - Static: Good (unsupported)  Sitting - Dynamic: Fair (occasional)  Standing:  (N/A)  Ambulation/Gait Training:  Ambulation - Level of Assistance:  (N/A-due to elevated HR at rest)  Pain:  Pain Scale 1: Numeric (0 - 10)  Pain Intensity 1: 0  Activity Tolerance:   Fair/good  Please refer to the flowsheet for vital signs taken during this treatment.  After treatment:   [ ]  Patient left in no apparent distress sitting up in chair  [ ]  Patient left sitting on EOB  [X]  Patient left in no apparent distress in bed  [ ]  Patient declined to be OOB at this time due to   [X]  Call bell left within reach  [X]  Nursing notified(Zadie, RN)  [ ]  Caregiver present  [X]  Bed alarm activated      COMMUNICATION/EDUCATION:   [X]          Fall prevention education was provided and the patient/caregiver indicated understanding.  [X]          Patient/family have participated as able in goal setting and plan of care.  [X]          Patient/family agree to work toward stated goals and plan of care.  [ ]          Patient understands intent and goals of therapy, but is neutral about his/her participation.  [ ]          Patient is unable to participate in goal setting and plan of care.     Thank you for this referral.  Margret ChanceAmanda M Johnathon Mittal, PT   Time Calculation: 10 mins      G-codes:  Mobility [phone] Current  CJ= 20-39%  [id number] Goal  CI= 1-19%.  The severity rating is based on the Level of Assistance required for Functional Mobility and ADLs.     Eval Complexity: History: LOW Complexity : Zero comorbidities / personal factors that will impact the outcome / POCExam:LOW Complexity : 1-2  Standardized tests and measures addressing body structure, function, activity limitation and / or participation in recreation  Presentation: LOW Complexity : Stable, uncomplicated  Overall Complexity:LOW

## 2015-11-21 NOTE — Progress Notes (Signed)
*ATTENTION:  This note has been created by a medical student for educational purposes only.  Please do not refer to the content of this note for clinical decision-making, billing, or other purposes.  Please see attending physician???s note to obtain clinical information on this patient.*          History and Physical - [facility]    [name] MRN: [id number]  SSN: [id number]    Date of Birth: 02-25-1922  Age: 82 y.o.  Sex: female      Subjective:      [name] Juarez is a 82 y.o.  African-American female with a PMH of DM type 2 that presents to the ED from her PCP office with a 1 month history of intermittent nausea, vomiting, weight loss and weakness. She is accompanied by her daughter, who is her caregiver. The patient says that her symptoms of vomiting began about a month ago and that she only vomits after she eats food. The vomit is comprised of what she ate and she denies any blood or coffee ground appearance. She and her daughter say that the vomiting is intermittent and that she is able to eat at times without vomiting. The last time she was able to eat without vomiting was Saturday 11/18/15. The daughter says that she is able to drink water without vomiting but that she vomits when drinking ensure. At home, the patient is independent and able to ambulate and make her own food.     She currently denies any pain but endorses a history of non-radiating left sided chest pain, a history of chest palpitations and a history of mild headaches. She is unsure what causes or alleviates the chest pain. She denies any history of stroke, MI or heart disease.         Past Medical History:   Diagnosis Date   ??? Arthritis    ??? Diabetes (Colon)    ??? Hypertension      Past Surgical History:   Procedure Laterality Date   ??? HX GYN      hysterectomy      History reviewed. No pertinent family history.  Social History   Substance Use Topics   ??? Smoking status: Never Smoker    ??? Smokeless tobacco: Never Used   ??? Alcohol use No      Home Medications:  Amlodipine-Atorvastatin '10mg'$ /'40mg'$  1 tablet PO Daily  Clonidine 0.'1mg'$  1 tablet PO BID  Ferrous Sulfate '325mg'$  1 tablet PO TID  Lisinopril/HCTZ '20mg'$ /12.'5mg'$  2 tablets PO Daily  Metoprolol Succinate '100mg'$  1 tablet PO Daily  Pioglitazone/Metformin '15mg'$ /'850mg'$  1 tablet PO BID  Tramadol '50mg'$  PO Q6hr PRN       No Known Allergies    Review of Systems:  History obtained from the patient and her daughter  CONSTITUTIONAL: Endorses weakness and weight loss Denies: fever, chills, weight gain  EYES: Denies: decreased vision  CARDIOVASCULAR: Intermittent chest pain, palpitations, Denies: dyspnea on exertion, orthopnea, edema  RESPIRATORY: Denies: cough, shortness of breath  HEME-LYMPH: Denies: bleeding  GI: Nausea, vomiting, Denies: abdominal pain, flank pain, constipation, black stool, blood in stool  GU: frequency/urgency, Denies: dysuria, hematuria  NEURO: Intermittent headache Denies: dizzy/vertigo  MUSCULOSKELETAL: Denies: back pain, joint pain, muscle pain  SKIN: Denies: rash  PSYCH: Denies: anxiety, depression      Objective:     Patient Vitals for the past 12 hrs:   Temp Pulse Resp BP SpO2   11/21/15 1305 - 89 12 - 100 %   11/21/15  1300 - - - 128/52 100 %   11/21/15 1245 - - - 161/88 -   11/21/15 1145 - 99 15 143/50 100 %   11/21/15 1130 - 99 16 139/61 100 %   11/21/15 1100 - 87 22 134/49 98 %   11/21/15 1045 - 88 20 143/51 -   11/21/15 1015 - 95 19 144/51 100 %   11/21/15 1000 - 90 16 150/53 99 %   11/21/15 0937 97.6 ??F (36.4 ??C) (!) 101 20 - 100 %   11/21/15 0936 - 99 24 147/50 -   11/21/15 0927 - 100 17 153/53 (!) 78 %     Breathing on room air    Physical Exam:  GENERAL: alert, cooperative, no distress, appears stated age, cachectic  EYE: conjunctivae/corneas clear. PERRL, EOM's intact. Fundi benign, negative findings: anicteric sclera, lids and lashes normal, conjunctivae and sclerae normal and pupils equal, round, reactive to light and  accomodation  LYMPHATIC: Cervical, supraclavicular, and axillary nodes normal.   THROAT & NECK: normal  LUNG: clear to auscultation bilaterally  HEART: systolic murmur: holosystolic 3/6, blowing at 2nd left intercostal space, at 2nd right intercostal space, at lower left sternal border, radiates to carotids, no rub  ABDOMEN: soft, non-tender. Bowel sounds normal. No masses,  no organomegaly  EXTREMITIES:  extremities normal, atraumatic, no cyanosis or edema  SKIN: no rash or abnormalities and depigmentation noted on trunk and extremities  NEUROLOGIC: negative findings: alert, oriented x3  speech normal in context and clarity  memory intact grossly  cranial nerves II-XII intact  motor strength: full proximally and distally  reflexes: full and symmetric, positive findings: rest tremor and intention tremor  RECTAL: normal findings:  nontender, no masses, no gross blood and stool hematest negative    Labs Reviewed:  Recent Results (from the past 24 hour(s))   EKG, 12 LEAD, INITIAL    Collection Time: 11/21/15  9:26 AM   Result Value Ref Range    Ventricular Rate 114 BPM    Atrial Rate 114 BPM    P-R Interval 126 ms    QRS Duration 74 ms    Q-T Interval 324 ms    QTC Calculation (Bezet) 446 ms    Calculated P Axis 65 degrees    Calculated R Axis 31 degrees    Calculated T Axis -121 degrees    Diagnosis       Sinus tachycardia with premature supraventricular complexes  Cannot rule out Anterior infarct , age undetermined  ST & T wave abnormality, consider inferolateral ischemia  Abnormal ECG  No previous ECGs available     CBC WITH AUTOMATED DIFF    Collection Time: 11/21/15  9:35 AM   Result Value Ref Range    WBC 9.3 4.6 - 13.2 K/uL    RBC 4.39 4.20 - 5.30 M/uL    HGB 10.7 (L) 12.0 - 16.0 g/dL    HCT 33.5 (L) 35.0 - 45.0 %    MCV 76.3 74.0 - 97.0 FL    MCH 24.4 24.0 - 34.0 PG    MCHC 31.9 31.0 - 37.0 g/dL    RDW 14.4 11.6 - 14.5 %    PLATELET 219 135 - 420 K/uL    MPV 10.1 9.2 - 11.8 FL    NEUTROPHILS 82 (H) 40 - 73 %     LYMPHOCYTES 10 (L) 21 - 52 %    MONOCYTES 8 3 - 10 %    EOSINOPHILS 0 0 - 5 %  BASOPHILS 0 0 - 2 %    ABS. NEUTROPHILS 7.6 1.8 - 8.0 K/UL    ABS. LYMPHOCYTES 0.9 0.9 - 3.6 K/UL    ABS. MONOCYTES 0.8 0.05 - 1.2 K/UL    ABS. EOSINOPHILS 0.0 0.0 - 0.4 K/UL    ABS. BASOPHILS 0.0 0.0 - 0.06 K/UL    DF AUTOMATED     METABOLIC PANEL, COMPREHENSIVE    Collection Time: 11/21/15  9:35 AM   Result Value Ref Range    Sodium 137 136 - 145 mmol/L    Potassium 3.4 (L) 3.5 - 5.5 mmol/L    Chloride 98 (L) 100 - 108 mmol/L    CO2 29 21 - 32 mmol/L    Anion gap 10 3.0 - 18 mmol/L    Glucose 199 (H) 74 - 99 mg/dL    BUN 22 (H) 7.0 - 18 MG/DL    Creatinine 0.92 0.6 - 1.3 MG/DL    BUN/Creatinine ratio 24 (H) 12 - 20      GFR est AA >60 >60 ml/min/1.30m    GFR est non-AA 57 (L) >60 ml/min/1.777m   Calcium 9.4 8.5 - 10.1 MG/DL    Bilirubin, total 0.7 0.2 - 1.0 MG/DL    ALT (SGPT) 22 13 - 56 U/L    AST (SGOT) 15 15 - 37 U/L    Alk. phosphatase 54 45 - 117 U/L    Protein, total 8.4 (H) 6.4 - 8.2 g/dL    Albumin 4.4 3.4 - 5.0 g/dL    Globulin 4.0 2.0 - 4.0 g/dL    A-G Ratio 1.1 0.8 - 1.7     MAGNESIUM    Collection Time: 11/21/15  9:35 AM   Result Value Ref Range    Magnesium 1.5 (L) 1.6 - 2.6 mg/dL   CARDIAC PANEL,(CK, CKMB & TROPONIN)    Collection Time: 11/21/15  9:35 AM   Result Value Ref Range    CK 74 26 - 192 U/L    CK - MB <1.0 <3.6 ng/ml    CK-MB Index  0.0 - 4.0 %     CALCULATION NOT PERFORMED WHEN RESULT IS BELOW LINEAR LIMIT    Troponin-I, Qt. 0.02 0.0 - 0.045 NG/ML   URINALYSIS W/ RFLX MICROSCOPIC    Collection Time: 11/21/15 10:44 AM   Result Value Ref Range    Color YELLOW      Appearance CLEAR      Specific gravity 1.011 1.005 - 1.030      pH (UA) 5.5 5.0 - 8.0      Protein TRACE (A) NEG mg/dL    Glucose 100 (A) NEG mg/dL    Ketone 15 (A) NEG mg/dL    Bilirubin NEGATIVE  NEG      Blood TRACE (A) NEG      Urobilinogen 0.2 0.2 - 1.0 EU/dL    Nitrites POSITIVE (A) NEG      Leukocyte Esterase LARGE (A) NEG      URINE MICROSCOPIC ONLY    Collection Time: 11/21/15 10:44 AM   Result Value Ref Range    WBC TOO NUMEROUS TO COUNT 0 - 4 /hpf    Epithelial cells FEW 0 - 5 /lpf    Bacteria 3+ (A) NEG /hpf   EKG, 12 LEAD, SUBSEQUENT    Collection Time: 11/21/15  1:22 PM   Result Value Ref Range    Ventricular Rate 89 BPM    Atrial Rate 89 BPM    P-R Interval 124 ms  QRS Duration 70 ms    Q-T Interval 350 ms    QTC Calculation (Bezet) 425 ms    Calculated P Axis 70 degrees    Calculated R Axis 37 degrees    Calculated T Axis -96 degrees    Diagnosis       Sinus rhythm with premature atrial complexes  ST & T wave abnormality, consider inferolateral ischemia  Abnormal ECG  When compared with ECG of 21-Nov-2015 09:26,  T wave inversion less evident in Lateral leads         Radiology Studies:  AP CXR on 11/21/15:  Portable erect AP view of the chest provided to evaluate weakness, with no  priors to compare  ??  There are monitoring leads on the chest and upper abdomen. The lungs appear well  aerated and free of infiltrate. No pleural effusions are seen. Cardiomediastinal  silhouette and heart size are normal. Bony structures are unremarkable.  ??  IMPRESSION: No active cardiopulmonary disease   ??       EKG at 0926: there are no previous tracings available for comparison, nonspecific ST and T waves changes, sinus tachycardia, PAC's noted.    EKG at 1322: normal sinus rhythm, nonspecific ST and T waves changes, PAC's noted.    Assessment:     82 yo AA female with PMH of HTN and DM type 2 that presents with a 1 month history of weight loss, nausea, vomiting after meals and weakness. She also presents with an intermittent history of chest pain, palpitations and increased urinary urgency and frequency. Her weakness is likely due to insufficient dietary intake, electrolyte imbalance and dehydration from vomiting and diuretic medications. UTI is probably exacerbating nausea and vomiting.     Plan:       Symptomatic acute uncomplicated UTI:   -Increased urinary urgency and frequency and +UA for nitrites, leukocyte esterase, WBCs and bacteria.  -Start empiric fosfomycin 3gm PO x 1 dose d/t renal function (CrCl:24 mL/min) and ease of administration. If fosfomycin unavailable use augmentin '500mg'$  PO BID for 7 days  -F/U urine culture and sensitivities    Vomiting with hypokalemia, hypochloremia and possible dehydration  -1 month history of intermittent vomiting following meals that is possibly exacerbated by UTI.   -Hypokalemia and hypochloremia a result of vomiting, decreased food intake, and diuretic medication use. Slightly hypomagnesemic also at 1.5  -Replace potassium with 22mq Q2hrs PO x 3 doses. Recheck potassium 4 hours after last dose.   -Replace magnesium with 2gm magnesium sulfate IV over 1 hour  -Start IV fluids and decrease as patient is able to tolerate diet.   -BMP and I/O daily to monitor renal function  -Albumin is normal, not likely to have had an extended period of malnutrition  -Start diabetic diet    Hx of HTN with intermittent chest pain and palpitations:  -Family unaware of HTN diagnosis  -Hold lisinopril and HCTZ for now until electrolytes are corrected  -Continue home Toprol XL '100mg'$  PO daily, Amlodipine '10mg'$  PO daily and clonidine 0.'1mg'$  PO TID for blood pressure control  -Chest pain and palpitations may be due to cardiac arrhythmia and possible aortic stenosis. Patient is tachycardic with PAC's on two EKG strips with audible holosystolic murmur that radiates to her carotids. AP CXR shows no active cardiopulmonary disease and cardiac enzymes are negative.   -Obtain ECHO of heart to visualize aorta and estimate EF  -Follow up EKGs after patient is stabilized and has received her home blood pressure medications  DM type II:   -Hold home pioglitazone and metformin while inpatient.  -Last hemoglobin A1c is unknown.   -Correctional insulin protocol with accuchecks ACHS    HLD?   -Denies any PMH of ASCVD or stroke but has risk factors d/t DM type 2.   -On statin and CK is normal, not likely the cause of weakness along with not complaints of myalgias  -Continue Lipitor 12m PO daily  -Last lipid panel is unknown, can be followed up as outpatient    Anemia  -FOBT negative and no signs of active GI bleeding  -Unsure of baseline Hgb.   -MCV is normal and no history of CKD but CrCl is < 356mmin  -Continue ferrous sulfate 32514mO TID    MSK Pain  -Hold tramadol  -Tylenol 650m43m q6hr PRN for pain  -Reassess pain management during admission    DVT prophylaxis: SCD's    Diet: Diabetic Diet    Code Status: Full    Signed By: JoshNadene Rubins    11/21/2015

## 2015-11-21 NOTE — ED Provider Notes (Signed)
HPI Comments: 9:37 AM  [name] Juarez is a 82 y.o. female with PMHX of DM presenting to the ED via EMS C/O central chest pain and weight loss for several weeks now. Pt was at her PCP's office this am for her weightloss. Pt was evaluated by her PCP Dr. Harlon DittyBrawley (family practice) for her weightloss and decreased appetite. Associated sxs include nausea. States she has not taken medications this AM. Family states she will eat small amounts but then vomits and states she does not feel like eating. Denies chest pain currently, states it is intermittent, unsure what exacerbates or alleviates the pain. Denies any radiation of the pain. Emesis is nonbloody, nonbilious. Pt denies diarrhea, fever, SOB, rash, and any other symptoms or complaints.          The history is provided by the patient and a relative.        Past Medical History:   Diagnosis Date   ??? Arthritis    ??? Diabetes (HCC)    ??? Hypertension        Past Surgical History:   Procedure Laterality Date   ??? HX GYN      hysterectomy         History reviewed. No pertinent family history.    Social History     Social History   ??? Marital status: SINGLE     Spouse name: N/A   ??? Number of children: N/A   ??? Years of education: N/A     Occupational History   ??? Not on file.     Social History Main Topics   ??? Smoking status: Never Smoker   ??? Smokeless tobacco: Never Used   ??? Alcohol use No   ??? Drug use: Not on file   ??? Sexual activity: Not Currently     Other Topics Concern   ??? Not on file     Social History Narrative   ??? No narrative on file         ALLERGIES: Review of patient's allergies indicates no known allergies.    Review of Systems   Constitutional: Positive for appetite change. Negative for chills and fever.   HENT: Negative for congestion.    Respiratory: Negative for cough and shortness of breath.    Cardiovascular: Positive for chest pain. Negative for leg swelling.   Gastrointestinal: Positive for nausea. Negative for abdominal pain, diarrhea and vomiting.    Genitourinary: Negative for difficulty urinating and dysuria.   Musculoskeletal: Negative.    Skin: Negative for rash.   Neurological: Negative for speech difficulty and headaches.   All other systems reviewed and are negative.      Vitals:    11/21/15 0927 11/21/15 0936 11/21/15 0937 11/21/15 0940   BP: 153/53 147/50     Pulse: 100 99 (!) 101    Resp: 17 24 20     Temp:   97.6 ??F (36.4 ??C)    SpO2: (!) 78%  100%    Weight:    38.1 kg (84 lb)   Height:    5' (1.524 m)            Physical Exam   HENT:   Head: Atraumatic.   Dry mucus membranes   Eyes: Conjunctivae are normal.   Neck: Normal range of motion. Neck supple. No JVD present.   Cardiovascular: Normal rate, regular rhythm and normal heart sounds.    Pulmonary/Chest: Effort normal. No respiratory distress. She exhibits no tenderness.   Shallow respirations but no  focal findings   Abdominal: Soft. Bowel sounds are normal. She exhibits no distension. There is no tenderness. There is no rebound and no guarding.   Musculoskeletal: She exhibits no edema or tenderness.   No midline spinal ttp or step off. No ttp over long bones. Pelvis stable   Neurological: No cranial nerve deficit.   Awake, moves all extremities, contracted but no focal findings   Skin: Skin is warm and dry.   Nursing note and vitals reviewed.       MDM  Number of Diagnoses or Management Options  Chest pain, unspecified type:   Hypomagnesemia:   Urinary tract infection with hematuria, site unspecified:   Weakness:   Diagnosis management comments: Tamarra Geiselman is a 82 y.o. female presenting with cp, dec po and weakness. Pain free currently. No previous ekg for comparison but similar to one obtained at office this am. Labs and imaging obtianed and iv hydration started. Pt remained pain free, will repeat enzymes and proceed with admission       Amount and/or Complexity of Data Reviewed  Clinical lab tests: ordered and reviewed  Tests in the radiology section of CPT??: ordered and reviewed (CXR  )       ED Course   MEDICATIONS GIVEN:  Medications   magnesium sulfate 2 g/50 ml IVPB (premix or compounded) (not administered)   cefTRIAXone (ROCEPHIN) 1 g in 0.9% sodium chloride (MBP/ADV) 50 mL MBP (not administered)   sodium chloride 0.9 % bolus infusion 1,000 mL (1,000 mL IntraVENous New Bag 11/21/15 1000)         Procedures    Vitals:  Patient Vitals for the past 12 hrs:   Temp Pulse Resp BP SpO2   11/21/15 0937 97.6 ??F (36.4 ??C) (!) 101 20 - 100 %   11/21/15 0936 - 99 24 147/50 -   11/21/15 0927 - 100 17 153/53 (!) 78 %         EKG interpretation by ED Physician:  9:28 AM   Sinus tachycardia. Rate: 114 bpm. T wave inversion inferior and lateral. No STEMI. No previous for comparison.     X-Ray, CT or other radiology findings or impressions:  Xr Chest Port    Result Date: 11/21/2015  Portable erect AP view of the chest provided to evaluate weakness, with no priors to compare There are monitoring leads on the chest and upper abdomen. The lungs appear well aerated and free of infiltrate. No pleural effusions are seen. Cardiomediastinal silhouette and heart size are normal. Bony structures are unremarkable.     IMPRESSION: No active cardiopulmonary disease      Progress notes, Consult notes or additional Procedure notes:   11:07 AM  Spoke with Dr. Micheline Rough [facility]), agrees with plan of admission.      Disposition:  Diagnosis:   1. Chest pain, unspecified type    2. Urinary tract infection with hematuria, site unspecified    3. Hypomagnesemia    4. Weakness        Disposition: Admitted    Scribe Attestation  Ulice Dash scribing for and in the presence of Gustavo Lah, MD   (11/21/15)      Physician Attestation  I personally performed the services described in this documentation, reviewed and edited the documentation which was dictated to the scribe in my presence, and it accurately records my words and actions.    Gustavo Lah, MD   (11/21/15)

## 2015-11-21 NOTE — Med Student Progress Note (Signed)
*ATTENTION:  This note has been created by a medical student for educational purposes only.  Please do not refer to the content of this note for clinical decision-making, billing, or other purposes.  Please see attending physician???s note to obtain clinical information on this patient.*         Admission History and Physical  [facility]      [name] MRN: [id number]  CSN: [id number]    Date of Birth: [birth year]  Age: 82 y.o.  Sex: female      DOA: 11/21/2015       HPI:     [name] Juarez is a 82 y.o. female with PMH of Diabetes Mellitus Type II and HTN, now presenting with complaint of vomiting after meals. Vomiting first occurred approximately one month ago, improved approximately 2 weeks ago, and worsened again on Sunday. She is able to drink water but vomits after any solid foods or Ensure drinks. She denies blood in the vomit. She endorses recent weakness. She has not experienced similar episodes in the past.      She endorses increased urinary frequency and urgency.     She also endorses a history of chest pain, which she describes as a pressure on her chest, most recently yesterday. She takes Tramadol for the chest pain.     Review of Systems   Constitutional: Positive for weight loss. Negative for chills and fever.   HENT: Positive for hearing loss.    Eyes: Negative for blurred vision.   Respiratory: Negative for cough and shortness of breath.    Cardiovascular: Positive for chest pain and palpitations. Negative for leg swelling.   Gastrointestinal: Positive for nausea and vomiting. Negative for abdominal pain, constipation and diarrhea.   Genitourinary: Positive for frequency and urgency. Negative for flank pain.   Musculoskeletal: Positive for falls.   Neurological: Positive for tremors, weakness and headaches. Negative for dizziness.   Psychiatric/Behavioral: Positive for memory loss.        Past Medical History:   Diagnosis Date   ??? Arthritis     ??? Diabetes ([place])    ??? Hypertension        Past Surgical History:   Procedure Laterality Date   ??? HX GYN      hysterectomy       History reviewed. No pertinent family history.    Social History     Social History   ??? Marital status: SINGLE     Spouse name: N/A   ??? Number of children: N/A   ??? Years of education: N/A     Social History Main Topics   ??? Smoking status: Never Smoker   ??? Smokeless tobacco: Never Used   ??? Alcohol use No   ??? Drug use: None   ??? Sexual activity: Not Currently     Other Topics Concern   ??? None     Social History Narrative   ??? None     Prior to Sunday, [name] Juarez has been able to ambulate and perform her ADLs without assistance. She does not drive. She lives with her daughter, who is employed as a Emergency planning/management officer.     No Known Allergies    Prior to Admission Medications   Prescriptions Last Dose Informant Patient Reported? Taking?   amLODIPine-atorvastatin (CADUET) 10-40 mg per tablet 11/20/2015 at 0800  Yes Yes   Sig: Take 1 Tab by mouth daily.   cloNIDine HCl (CATAPRES) 0.1 mg tablet 11/20/2015 at 2000  Yes Yes   Sig: Take 0.1 mg by mouth two (2) times a day.   ferrous sulfate (SLOW FE) 142 mg (45 mg iron) ER tablet 11/20/2015 at 0800  Yes Yes   Sig: Take 142 mg by mouth Daily (before breakfast).   lisinopril-hydroCHLOROthiazide (PRINZIDE, ZESTORETIC) 20-12.5 mg per tablet 11/20/2015 at 0800  Yes Yes   Sig: Take 2 Tabs by mouth daily.   metoprolol succinate (TOPROL-XL) 100 mg tablet 11/20/2015 at 0800  Yes Yes   Sig: Take 100 mg by mouth daily.   pioglitazone-metFORMIN (ACTOPLUS MET) 15-850 mg per tablet 11/20/2015 at 1800  Yes Yes   Sig: Take 1 Tab by mouth two (2) times daily (with meals).      Facility-Administered Medications: None   Tramadol HCl - 50 MG oral - one tablet every 6 hours as needed      Physical Exam:     Patient Vitals for the past 24 hrs:   Temp Pulse Resp BP SpO2   11/21/15 1409 97.5 ??F (36.4 ??C) (!) 103 14 173/68 100 %   11/21/15 1305 - 89 12 - 100 %    11/21/15 1300 - - - 128/52 100 %   11/21/15 1245 - - - 161/88 -   11/21/15 1145 - 99 15 143/50 100 %   11/21/15 1130 - 99 16 139/61 100 %   11/21/15 1100 - 87 22 134/49 98 %   11/21/15 1045 - 88 20 143/51 -   11/21/15 1015 - 95 19 144/51 100 %   11/21/15 1000 - 90 16 150/53 99 %   11/21/15 0937 97.6 ??F (36.4 ??C) (!) 101 20 - 100 %   11/21/15 0936 - 99 24 147/50 -   11/21/15 0927 - 100 17 153/53 (!) 78 %       Physical Exam   HENT:   Head: Normocephalic.   Neck: No thyromegaly present.   Cardiovascular: Normal rate, regular rhythm and intact distal pulses.  Exam reveals no gallop and no friction rub.    Murmur heard.  Pulmonary/Chest: Effort normal and breath sounds normal.   Abdominal: Soft. Bowel sounds are normal. She exhibits no distension. There is no tenderness. There is no guarding and no CVA tenderness.   Musculoskeletal: Normal range of motion. She exhibits no edema or tenderness.   Neurological: She is alert. She displays tremor and abnormal reflex.   Reflex Scores:       Brachioradialis reflexes are 1+ on the right side and 1+ on the left side.       Patellar reflexes are 1+ on the right side and 1+ on the left side.  Skin: Skin is warm.   Psychiatric: She has a normal mood and affect. Her behavior is normal.       IMAGING:     Recent Results (from the past 12 hour(s))   EKG, 12 LEAD, INITIAL    Collection Time: 11/21/15  9:26 AM   Result Value Ref Range    Ventricular Rate 114 BPM    Atrial Rate 114 BPM    P-R Interval 126 ms    QRS Duration 74 ms    Q-T Interval 324 ms    QTC Calculation (Bezet) 446 ms    Calculated P Axis 65 degrees    Calculated R Axis 31 degrees    Calculated T Axis -121 degrees    Diagnosis       Sinus tachycardia with premature supraventricular complexes  Cannot rule out Anterior  infarct , age undetermined  ST & T wave abnormality, consider inferolateral ischemia  Abnormal ECG  No previous ECGs available     CBC WITH AUTOMATED DIFF    Collection Time: 11/21/15  9:35 AM    Result Value Ref Range    WBC 9.3 4.6 - 13.2 K/uL    RBC 4.39 4.20 - 5.30 M/uL    HGB 10.7 (L) 12.0 - 16.0 g/dL    HCT 33.5 (L) 35.0 - 45.0 %    MCV 76.3 74.0 - 97.0 FL    MCH 24.4 24.0 - 34.0 PG    MCHC 31.9 31.0 - 37.0 g/dL    RDW 14.4 11.6 - 14.5 %    PLATELET 219 135 - 420 K/uL    MPV 10.1 9.2 - 11.8 FL    NEUTROPHILS 82 (H) 40 - 73 %    LYMPHOCYTES 10 (L) 21 - 52 %    MONOCYTES 8 3 - 10 %    EOSINOPHILS 0 0 - 5 %    BASOPHILS 0 0 - 2 %    ABS. NEUTROPHILS 7.6 1.8 - 8.0 K/UL    ABS. LYMPHOCYTES 0.9 0.9 - 3.6 K/UL    ABS. MONOCYTES 0.8 0.05 - 1.2 K/UL    ABS. EOSINOPHILS 0.0 0.0 - 0.4 K/UL    ABS. BASOPHILS 0.0 0.0 - 0.06 K/UL    DF AUTOMATED     METABOLIC PANEL, COMPREHENSIVE    Collection Time: 11/21/15  9:35 AM   Result Value Ref Range    Sodium 137 136 - 145 mmol/L    Potassium 3.4 (L) 3.5 - 5.5 mmol/L    Chloride 98 (L) 100 - 108 mmol/L    CO2 29 21 - 32 mmol/L    Anion gap 10 3.0 - 18 mmol/L    Glucose 199 (H) 74 - 99 mg/dL    BUN 22 (H) 7.0 - 18 MG/DL    Creatinine 0.92 0.6 - 1.3 MG/DL    BUN/Creatinine ratio 24 (H) 12 - 20      GFR est AA >60 >60 ml/min/1.51m    GFR est non-AA 57 (L) >60 ml/min/1.728m   Calcium 9.4 8.5 - 10.1 MG/DL    Bilirubin, total 0.7 0.2 - 1.0 MG/DL    ALT (SGPT) 22 13 - 56 U/L    AST (SGOT) 15 15 - 37 U/L    Alk. phosphatase 54 45 - 117 U/L    Protein, total 8.4 (H) 6.4 - 8.2 g/dL    Albumin 4.4 3.4 - 5.0 g/dL    Globulin 4.0 2.0 - 4.0 g/dL    A-G Ratio 1.1 0.8 - 1.7     MAGNESIUM    Collection Time: 11/21/15  9:35 AM   Result Value Ref Range    Magnesium 1.5 (L) 1.6 - 2.6 mg/dL   CARDIAC PANEL,(CK, CKMB & TROPONIN)    Collection Time: 11/21/15  9:35 AM   Result Value Ref Range    CK 74 26 - 192 U/L    CK - MB <1.0 <3.6 ng/ml    CK-MB Index  0.0 - 4.0 %     CALCULATION NOT PERFORMED WHEN RESULT IS BELOW LINEAR LIMIT    Troponin-I, Qt. 0.02 0.0 - 0.045 NG/ML   URINALYSIS W/ RFLX MICROSCOPIC    Collection Time: 11/21/15 10:44 AM   Result Value Ref Range    Color YELLOW       Appearance CLEAR      Specific gravity 1.011  1.005 - 1.030      pH (UA) 5.5 5.0 - 8.0      Protein TRACE (A) NEG mg/dL    Glucose 100 (A) NEG mg/dL    Ketone 15 (A) NEG mg/dL    Bilirubin NEGATIVE  NEG      Blood TRACE (A) NEG      Urobilinogen 0.2 0.2 - 1.0 EU/dL    Nitrites POSITIVE (A) NEG      Leukocyte Esterase LARGE (A) NEG     URINE MICROSCOPIC ONLY    Collection Time: 11/21/15 10:44 AM   Result Value Ref Range    WBC TOO NUMEROUS TO COUNT 0 - 4 /hpf    Epithelial cells FEW 0 - 5 /lpf    Bacteria 3+ (A) NEG /hpf   EKG, 12 LEAD, SUBSEQUENT    Collection Time: 11/21/15  1:22 PM   Result Value Ref Range    Ventricular Rate 89 BPM    Atrial Rate 89 BPM    P-R Interval 124 ms    QRS Duration 70 ms    Q-T Interval 350 ms    QTC Calculation (Bezet) 425 ms    Calculated P Axis 70 degrees    Calculated R Axis 37 degrees    Calculated T Axis -96 degrees    Diagnosis       Sinus rhythm with premature atrial complexes  ST & T wave abnormality, consider inferolateral ischemia  Abnormal ECG  When compared with ECG of 21-Nov-2015 09:26,  T wave inversion less evident in Lateral leads     CARDIAC PANEL,(CK, CKMB & TROPONIN)    Collection Time: 11/21/15  1:24 PM   Result Value Ref Range    CK 64 26 - 192 U/L    CK - MB 1.0 <3.6 ng/ml    CK-MB Index 1.6 0.0 - 4.0 %    Troponin-I, Qt. 0.05 (H) 0.0 - 0.045 NG/ML         Assessment/Plan:   82 y.o. female with PMH of DMII and HTN, now admitted with vomiting after meals.   (1) Vomiting - most likely secondary to UTI/cystitis, urinalysis demonstrates numerous WBCs and positive nitrites  (2) Chest pain   (3) Diabetes Mellitus Type II  (4) HTN        Plan:   (1) Urine culture   (2) Treat with SMX-TMP for UTI  (3) Continue current medications for DMII, HTN, and chest pain   (4) Zofran to relieve nausea as needed        _0 @

## 2015-11-22 LAB — MAGNESIUM: Magnesium: 1.8 mg/dL (ref 1.6–2.6)

## 2015-11-22 LAB — EKG, 12 LEAD, INITIAL
Atrial Rate: 114 {beats}/min
Atrial Rate: 94 {beats}/min
Calculated P Axis: 65 degrees
Calculated P Axis: 59 degrees
Calculated R Axis: 31 degrees
Calculated R Axis: 32 degrees
Calculated T Axis: -121 degrees
Calculated T Axis: -128 degrees
Diagnosis: NORMAL
P-R Interval: 126 ms
P-R Interval: 118 ms
Q-T Interval: 324 ms
Q-T Interval: 350 ms
QRS Duration: 74 ms
QRS Duration: 72 ms
QTC Calculation (Bezet): 446 ms
QTC Calculation (Bezet): 437 ms
Ventricular Rate: 114 {beats}/min
Ventricular Rate: 94 {beats}/min

## 2015-11-22 LAB — METABOLIC PANEL, BASIC
Anion gap: 11 mmol/L (ref 3.0–18)
BUN/Creatinine ratio: 17 (ref 12–20)
BUN: 13 MG/DL (ref 7.0–18)
CO2: 28 mmol/L (ref 21–32)
Calcium: 8.7 MG/DL (ref 8.5–10.1)
Chloride: 103 mmol/L (ref 100–108)
Creatinine: 0.75 MG/DL (ref 0.6–1.3)
GFR est AA: >60 mL/min/{1.73_m2} (ref >60)
GFR est non-AA: >60 mL/min/{1.73_m2} (ref >60)
Glucose: 93 mg/dL (ref 74–99)
Potassium: 2.7 mmol/L — CL (ref 3.5–5.5)
Sodium: 142 mmol/L (ref 136–145)

## 2015-11-22 LAB — EKG, 12 LEAD, SUBSEQUENT
Atrial Rate: 89 {beats}/min
Calculated P Axis: 70 degrees
Calculated R Axis: 37 degrees
Calculated T Axis: -96 degrees
P-R Interval: 124 ms
Q-T Interval: 350 ms
QRS Duration: 70 ms
QTC Calculation (Bezet): 425 ms
Ventricular Rate: 89 {beats}/min

## 2015-11-22 LAB — CBC WITH AUTOMATED DIFF
ABS. BASOPHILS: 0 10*3/uL (ref 0.0–0.1)
ABS. EOSINOPHILS: 0 10*3/uL (ref 0.0–0.4)
ABS. LYMPHOCYTES: 1.4 10*3/uL (ref 0.9–3.6)
ABS. MONOCYTES: 1.2 10*3/uL (ref 0.05–1.2)
ABS. NEUTROPHILS: 7.7 10*3/uL (ref 1.8–8.0)
BASOPHILS: 0 % (ref 0–2)
EOSINOPHILS: 0 % (ref 0–5)
HCT: 29.1 % — ABNORMAL LOW (ref 35.0–45.0)
HGB: 9.3 g/dL — ABNORMAL LOW (ref 12.0–16.0)
LYMPHOCYTES: 14 % — ABNORMAL LOW (ref 21–52)
MCH: 24.4 PG (ref 24.0–34.0)
MCHC: 32 g/dL (ref 31.0–37.0)
MCV: 76.4 FL (ref 74.0–97.0)
MONOCYTES: 12 % — ABNORMAL HIGH (ref 3–10)
MPV: 10.3 FL (ref 9.2–11.8)
NEUTROPHILS: 74 % — ABNORMAL HIGH (ref 40–73)
PLATELET: 218 10*3/uL (ref 135–420)
RBC: 3.81 M/uL — ABNORMAL LOW (ref 4.20–5.30)
RDW: 14.8 % — ABNORMAL HIGH (ref 11.6–14.5)
WBC: 10.4 10*3/uL (ref 4.6–13.2)

## 2015-11-22 LAB — LACTIC ACID
Lactic acid: 2.3 MMOL/L — CR (ref 0.4–2.0)
Lactic acid: 1.1 MMOL/L (ref 0.4–2.0)

## 2015-11-22 LAB — GLUCOSE, POC
Glucose (POC): 182 mg/dL — ABNORMAL HIGH (ref 70–110)
Glucose (POC): 93 mg/dL (ref 70–110)
Glucose (POC): 207 mg/dL — ABNORMAL HIGH (ref 70–110)
Glucose (POC): 113 mg/dL — ABNORMAL HIGH (ref 70–110)

## 2015-11-22 LAB — CARDIAC PANEL,(CK, CKMB & TROPONIN)
CK - MB: 1.5 ng/ml (ref <3.6)
CK-MB Index: 2.2 % (ref 0.0–4.0)
CK: 68 U/L (ref 26–192)
Troponin-I, QT: 0.06 NG/ML — ABNORMAL HIGH (ref 0.0–0.045)

## 2015-11-22 LAB — POTASSIUM: Potassium: 4.2 mmol/L (ref 3.5–5.5)

## 2015-11-22 LAB — EKG 12-LEAD
Atrial Rate: 114 {beats}/min
Atrial Rate: 89 {beats}/min
Atrial Rate: 94 {beats}/min
Diagnosis: NORMAL
P Axis: 65 degrees
P Axis: 70 degrees
P Axis: 59 degrees
P-R Interval: 126 ms
P-R Interval: 124 ms
P-R Interval: 118 ms
Q-T Interval: 324 ms
Q-T Interval: 350 ms
Q-T Interval: 350 ms
QRS Duration: 74 ms
QRS Duration: 70 ms
QRS Duration: 72 ms
QTc Calculation (Bazett): 446 ms
QTc Calculation (Bazett): 425 ms
QTc Calculation (Bazett): 437 ms
R Axis: 31 degrees
R Axis: 37 degrees
R Axis: 32 degrees
T Axis: -121 degrees
T Axis: -96 degrees
T Axis: -128 degrees
Ventricular Rate: 114 {beats}/min
Ventricular Rate: 89 {beats}/min
Ventricular Rate: 94 {beats}/min

## 2015-11-22 LAB — ECHOCARDIOGRAM COMPLETE 2D W DOPPLER W COLOR: Left Ventricular Ejection Fraction: 68

## 2015-11-22 MED ORDER — POTASSIUM CHLORIDE 20 MEQ ORAL PACKET FOR SOLUTION
20 mEq | Freq: Two times a day (BID) | ORAL | Status: AC
Start: 2015-11-22 — End: 2015-11-22
  Administered 2015-11-22 (×2): via ORAL

## 2015-11-22 MED ORDER — CLONIDINE 0.1 MG TAB
0.1 mg | ORAL | Status: DC
Start: 2015-11-22 — End: 2015-11-22

## 2015-11-22 MED ORDER — POTASSIUM CHLORIDE SR 20 MEQ TAB, PARTICLES/CRYSTALS
20 mEq | ORAL | Status: DC
Start: 2015-11-22 — End: 2015-11-22

## 2015-11-22 MED ORDER — SACCHAROMYCES BOULARDII 250 MG CAP
250 mg | Freq: Two times a day (BID) | ORAL | Status: DC
Start: 2015-11-22 — End: 2015-11-24
  Administered 2015-11-22 – 2015-11-24 (×6): via ORAL

## 2015-11-22 MED ORDER — CLONIDINE 0.1 MG TAB
0.1 mg | Freq: Every day | ORAL | Status: DC
Start: 2015-11-22 — End: 2015-11-24
  Administered 2015-11-23 – 2015-11-24 (×2): via ORAL

## 2015-11-22 MED FILL — PROBIOTIC (S.BOULARDII) 250 MG CAPSULE: 250 mg | ORAL | Qty: 1

## 2015-11-22 MED FILL — HEPARIN (PORCINE) 5,000 UNIT/ML IJ SOLN: 5000 unit/mL | INTRAMUSCULAR | Qty: 1

## 2015-11-22 MED FILL — PIPERACILLIN-TAZOBACTAM 2.25 GRAM IV SOLR: 2.25 gram | INTRAVENOUS | Qty: 2.25

## 2015-11-22 MED FILL — AMLODIPINE 10 MG TAB: 10 mg | ORAL | Qty: 1

## 2015-11-22 MED FILL — FERROUS SULFATE 325 MG (65 MG ELEMENTAL IRON) TAB: 325 mg (65 mg iron) | ORAL | Qty: 1

## 2015-11-22 MED FILL — LISINOPRIL-HYDROCHLOROTHIAZIDE 20 MG-12.5 MG TAB: ORAL | Qty: 2

## 2015-11-22 MED FILL — POTASSIUM CHLORIDE 20 MEQ ORAL PACKET FOR SOLUTION: 20 mEq | ORAL | Qty: 2

## 2015-11-22 MED FILL — INSULIN LISPRO 100 UNIT/ML INJECTION: 100 unit/mL | SUBCUTANEOUS | Qty: 1

## 2015-11-22 MED FILL — CLONIDINE 0.1 MG TAB: 0.1 mg | ORAL | Qty: 1

## 2015-11-22 MED FILL — METOPROLOL SUCCINATE SR 100 MG 24 HR TAB: 100 mg | ORAL | Qty: 1

## 2015-11-22 NOTE — Progress Notes (Addendum)
S: Notified by nursing that patient is awake and attempting to get out of bed. Pt called for her daughter after awaking from sleep.     B: Pt had arrived on new floor from telemetry ward 20 minutes ago.Nursing is concerned that patient needs something for sleep.Pt denies difficulty breathing or chest pain.Pt recognizes she is under a physician's care.    A:  Patient Vitals for the past 12 hrs:   Temp Pulse Resp BP SpO2   11/22/15 1948 98.7 ??F (37.1 ??C) 83 18 174/65 100 %   11/22/15 1753 98.7 ??F (37.1 ??C) 86 18 (!) 133/101 98 %   11/22/15 1119 98.8 ??F (37.1 ??C) 99 20 149/69 99 %     Exam:  GEN: NAD and in bed.  Pt oriented to self. Not oriented to hospital, year, or President.  ZO:[id number]:Systolic 3+ ejection murmur in aortic and pulmonic regions that radiates to neck. 2+ Carotid Bruit with R>L.symmetric upper extremity pulse with regular rate and rhythm.  Lungs: CTA  Neuro: Cranial nerves intact including symmetric smile. Normal sensation in all extremities. Able to maintain 45* in both upper extremities for 10 sec and 5 sec for lower extremities. Mild slurred speech due to missing dentures, but follows commands, comprehends interview, and appears calm. Following exam, pt resting comfortably and falling back to sleep.  Extremities: No sign of swelling or bruising.    Assessment: Delirium due to change in environment. No focal deficits on exam. Patient has fallen back to sleep without distress.    Recommend: Continue to monitor. No prior problems with delirium during this admission.  -Ensure normal sleep/wake cycle, and bed alarm  -No medication intervention at this time  -Will reassess if pt condition worsens or becomes agitated or confused    Danton ClapJim Gari Trovato PGY-1

## 2015-11-22 NOTE — Progress Notes (Signed)
Intern Progress Note  [facility]       [name] MRN: [id number]  CSN: [id number]    Date of Birth: 02-15-1922  Age: 82 y.o.  Sex: female    DOA: 11/21/2015 LOS:  LOS: 1 day                    Subjective:     Acute events: Telemetry reports many PACs with HR between 70 and 130    Patient resting comfortably in bed. Reports no new complaints. Denies chest pain, SOB, dyspnea. She endorsed ambulating and urinating without difficulty.     Review of Systems   Constitutional: Negative for chills and fever.   Eyes: Negative for blurred vision and double vision.   Respiratory: Negative for cough, shortness of breath and wheezing.    Cardiovascular: Negative for chest pain, palpitations and leg swelling.   Gastrointestinal: Negative for constipation and diarrhea.   Genitourinary: Negative for dysuria.   Neurological: Negative for dizziness and headaches.       Objective:      Patient Vitals for the past 24 hrs:   Temp Pulse Resp BP SpO2   11/22/15 0701 98.6 ??F (37 ??C) 91 20 141/53 100 %   11/22/15 0418 98.2 ??F (36.8 ??C) (!) 101 18 148/73 99 %   11/21/15 2328 97.8 ??F (36.6 ??C) 94 18 130/46 100 %   11/21/15 1949 97.8 ??F (36.6 ??C) 95 18 140/59 100 %   11/21/15 1630 98.1 ??F (36.7 ??C) 78 16 149/63 97 %   11/21/15 1409 97.5 ??F (36.4 ??C) (!) 103 14 173/68 100 %   11/21/15 1305 - 89 12 - 100 %   11/21/15 1300 - - - 128/52 100 %   11/21/15 1245 - - - 161/88 -   11/21/15 1145 - 99 15 143/50 100 %   11/21/15 1130 - 99 16 139/61 100 %   11/21/15 1100 - 87 22 134/49 98 %   11/21/15 1045 - 88 20 143/51 -   11/21/15 1015 - 95 19 144/51 100 %   11/21/15 1000 - 90 16 150/53 99 %   11/21/15 0937 97.6 ??F (36.4 ??C) (!) 101 20 - 100 %   11/21/15 0936 - 99 24 147/50 -   11/21/15 0927 - 100 17 153/53 (!) 78 %         Intake/Output Summary (Last 24 hours) at 11/22/15 0856  Last data filed at 11/22/15 0617   Gross per 24 hour   Intake              900 ml   Output              750 ml   Net              150 ml        Physical Exam   HENT:   Head: Normocephalic.   Neck: No tracheal deviation present.   Cardiovascular: Normal rate, regular rhythm and intact distal pulses.  Exam reveals no gallop and no friction rub.    Systolic murmur radiating to carotids   Pulmonary/Chest: Effort normal and breath sounds normal. No respiratory distress. She has no wheezes. She has no rales.   Abdominal: Soft. Bowel sounds are normal. She exhibits no distension. There is no tenderness.   Musculoskeletal: She exhibits no edema or tenderness.   Skin: Skin is warm and dry.       Lab/Data Reviewed:  BMP:  Lab Results   Component Value Date/Time    NA 142 11/22/2015 02:24 AM    K 2.7 (LL) 11/22/2015 02:24 AM    CL 103 11/22/2015 02:24 AM    CO2 28 11/22/2015 02:24 AM    AGAP 11 11/22/2015 02:24 AM    GLU 93 11/22/2015 02:24 AM    BUN 13 11/22/2015 02:24 AM    CREA 0.75 11/22/2015 02:24 AM    GFRAA >60 11/22/2015 02:24 AM    GFRNA >60 11/22/2015 02:24 AM     CBC:   Lab Results   Component Value Date/Time    WBC 10.4 11/22/2015 02:24 AM    HGB 9.3 (L) 11/22/2015 02:24 AM    HCT 29.1 (L) 11/22/2015 02:24 AM    PLT 218 11/22/2015 02:24 AM     All Cardiac Markers in the last 24 hours:   Lab Results   Component Value Date/Time    CPK 68 11/22/2015 02:24 AM    CPK 81 11/21/2015 06:08 PM    CPK 64 11/21/2015 01:24 PM    CPK 74 11/21/2015 09:35 AM    CKMB 1.5 11/22/2015 02:24 AM    CKMB 2.0 11/21/2015 06:08 PM    CKMB 1.0 11/21/2015 01:24 PM    CKMB <1.0 11/21/2015 09:35 AM    CKND1 2.2 11/22/2015 02:24 AM    CKND1 2.5 11/21/2015 06:08 PM    CKND1 1.6 11/21/2015 01:24 PM    CKND1  11/21/2015 09:35 AM     CALCULATION NOT PERFORMED WHEN RESULT IS BELOW LINEAR LIMIT    TROIQ 0.06 (H) 11/22/2015 02:24 AM    TROIQ 0.07 (H) 11/21/2015 06:08 PM    TROIQ 0.05 (H) 11/21/2015 01:24 PM    TROIQ 0.02 11/21/2015 09:35 AM        Scheduled Medications Reviewed:  Current Facility-Administered Medications   Medication Dose Route Frequency    ??? potassium chloride (K-DUR, KLOR-CON) SR tablet 40 mEq  40 mEq Oral Q4H   ??? 0.9% sodium chloride infusion  75 mL/hr IntraVENous CONTINUOUS   ??? insulin lispro (HUMALOG) injection   SubCUTAneous AC&HS   ??? ferrous sulfate tablet 325 mg  325 mg Oral TID WITH MEALS   ??? lisinopril-hydroCHLOROthiazide (PRINZIDE, ZESTORETIC) 20-12.5 mg per tablet 2 Tab  2 Tab Oral DAILY   ??? metoprolol succinate (TOPROL-XL) tablet 100 mg  100 mg Oral DAILY   ??? amLODIPine (NORVASC) tablet 10 mg  10 mg Oral DAILY   ??? heparin (porcine) injection 5,000 Units  5,000 Units SubCUTAneous Q8H   ??? piperacillin-tazobactam (ZOSYN) 2.25 g in 0.9% sodium chloride (MBP/ADV) 50 mL MBP  2.25 g IntraVENous Q6H         Imaging, microbiology, and EKG/Telemetry:  None new    Assessment/Plan     82 y.o. female with PMH significant for type II DM, hypertension and mixed and sensorineural hearing loss now admitted for chest pain.  ??  Sepsis with a source; UTI 2/4 SIRS criteria patient received ceftriaxone in the ED. HR 90+, RR 12-24. WBC on admission 9.3 now 10.4. Left shift with neutrophils 82 on admission, now 74. Urinalysis with +LE, nitrates and 3+ bacteria. Lactic acid was elevated at 2.3.  - Follow up urine culture  - Follow up blood culture (please note this was drawn after administration of ceftriaxone)  - NS @ 75 for replacement fluid  - Monitor I/O carefully  - Zosyn 2.25 Q 6 H per pharmacy. After 24 hours of zosyn transition to levaquin.   -  Probiotics for medication induced diarrhea prophylaxis    Chest pain patient reported episodes of chest pressure that resolve with tramadol. EKG in the ED had T wave inversion concerning for ischemia in the inferolateral leads. EKG 08/2015 IS NOT indicative of T wave inversion in inferolateral leads Troponin 0.02 in the ED, 0.05>0.07>0.6 on repeat. No pain reported at this time   - Pro-BNP WNL at 828, consider echo as outpatient if DOE continues  - EKG from this morning is unchanged from admission EKG.   - Systolic murmur first recorded in OP records in November. Consider echo today.   ??- K 2.7 this morning. 40 meq PO powder BID today.   - FU serum K this afternoon.  ??  Nausea/vomitting patient reports nausea and vomiting over the last three weeks  - Zofran 4 mg Q4H PRN  ??  Controlled DM II Last A1C 6.6. Patient is taking pioglitizone-metformin 15-850 BID at home. States her blood sugars stay below 140.   - SSI  - ACHS glucose checks  - Diabetic diet  ??  Hypertension Range of 128/52-173/68 at admission. Taking metoprolol 100 mg every day, amlodipine-atorvastatin 10-40 mg every day, lisinopril-HCTZ 20-12.5, 2 tablets every day, clonidine 0.1 mg BID.   - Continue metoprolol 100 mg every day  - Continue amlodipine 10 mg every day  - Continue lisinopril-HCTZ 20-12.5, 2 tablets every day   - Hold atorvastatin 40 mg every day  - Continue clonidine at 0.1 per day and tapering off clonidine after 3-7 days.   ??  [facility], and was found to have moderately-severe to profound mixed hearing loss in the right ear and a moderate sloping to severe sensorineural hearing loss in the left ear.   - Was given CHEAR application and recommended to follow up   ??  Microcytic anemia Hb 9.6 on last outpatient labs 11/14/2015. Hb 10.7, MCV 76.3 on admission.   - Continue home ferrous sulfate delayed release TID  - Hemoccult negative  - Hb decreased to 9.3 today. Likely delutional.   ??  Diet: Diabetic  DVT Prophylaxis: Heparin   Code Status: DNR  Point of Contact: Venida JarvisMadie Carns [phone]  ??  Disposition and anticipated LOS: 2+ midnights  ??  Westley HummerSarai Magalie Almon, MD   PGY-1 [facility] Resident  [facility] [employer]

## 2015-11-22 NOTE — Other (Signed)
Bedside and Verbal shift change report given to Dennie Fetters (oncoming nurse) by Mat Carne (offgoing nurse). Report included the following information SBAR, Kardex, MAR and Recent Results.    SITUATION:   ? Code Status: Full Code  Reason for Admission: Chest pain  Hypomagnesemia  Weakness  ? Chest pain    ? Hospital day: 1  ? Problem List:       Hospital Problems  Never Reviewed          Codes Class Noted POA    Hypomagnesemia ICD-10-CM: E83.42  ICD-9-CM: 275.2  11/21/2015 Unknown        Chest pain ICD-10-CM: R07.9  ICD-9-CM: 786.50  11/21/2015 Unknown        Weakness ICD-10-CM: R53.1  ICD-9-CM: 780.79  11/21/2015 Unknown        * (Principal)Sepsis (HCC) ICD-10-CM: A41.9  ICD-9-CM: 038.9, 995.91  11/21/2015 Yes              BACKGROUND:    Past Medical History:   Past Medical History:   Diagnosis Date   ??? Arthritis    ??? Diabetes (HCC)    ??? Hypertension          Patient taking anticoagulants yes     ASSESSMENT:   ? Changes in Assessment Throughout Shift: Stable      ? Patient has Central Line: no Reasons if yes:   ? Patient has Foley Cath: no Reasons if yes:      ? Last Vitals:     Vitals:    11/22/15 0701 11/22/15 1119 11/22/15 1753 11/22/15 1948   BP: 141/53 149/69 (!) 133/101 174/65   Pulse: 91 99 86 83   Resp: Temp: 98.6 ??F (37 ??C) 98.8 ??F (37.1 ??C) 98.7 ??F (37.1 ??C) 98.7 ??F (37.1 ??C)   SpO2: 100% 99% 98% 100%   Weight:       Height:           ? IV and DRAINS (will only show if present)   Peripheral IV 11/21/15 Left Forearm-Site Assessment: Clean, dry, & intact    ? WOUND (if present)   Wound Type:  none   Dressing present Dressing Present : No   Wound Concerns/Notes:      ? PAIN    Pain Assessment    Pain Intensity 1: 0 (11/22/15 1830)              Patient Stated Pain Goal: 0  o Interventions for Pain:  none  o Intervention effective:   o Time of last intervention:    o Reassessment Completed: yes     ? Last 3 Weights:  Last 3 Recorded Weights in this Encounter    11/21/15 0940 11/22/15 0422    Weight: 38.1 kg (84 lb) 39.9 kg (88 lb)     Weight change:     ? INTAKE/OUPUT    Current Shift:      Last three shifts: 07/18 0701 - 07/19 1900  In: 2720 [P.O.:920; I.V.:1800]  Out: 1250 [Urine:1250]    ? LAB RESULTS     Recent Labs      11/22/15   0224  11/21/15   0935   WBC  10.4  9.3   HGB  9.3*  10.7*   HCT  29.1*  33.5*   PLT  218  219        Recent Labs      11/22/15   1715  11/22/15  0224  11/21/15   0935   NA   --   142  137   K  4.2  2.7*  3.4*   GLU   --   93  199*   BUN   --   13  22*   CREA   --   0.75  0.92   CA   --   8.7  9.4   MG   --   1.8  1.5*       RECOMMENDATIONS AND DISCHARGE PLANNING     1. Pending tests/procedures/ Plan of Care or Other Needs: none at this time     2. Discharge plan for patient and Needs/Barriers:     3. Estimated Discharge Date: 11/23/15 Posted on Whiteboard in Patient???s Room: yes      4. The patient's care plan was reviewed with the oncoming nurse.       "HEALS" SAFETY CHECK      Fall Risk    Total Score: 3    Safety Measures: Safety Measures: Bed/Chair alarm on, Bed/Chair-Wheels locked, Bed in low position, Call light within reach    A safety check occurred in the patient's room between off going nurse and oncoming nurse listed above.    The safety check included the below items  Area Items   H  High Alert Medications ? Verify all high alert medication drips (heparin, PCA, etc.)   E  Equipment ? Suction is set up for ALL patients (with yanker)  ? Red plugs utilized for all equipment (IV pumps, etc.)  ? WOW???s wiped down at end of shift.  ? Room stocked with oxygen, suction, and other unit-specific supplies   A  Alarms ? Bed alarm is set for fall risk patients  ? Ensure chair alarm is in place and activated if patient is up in a chair   L  Lines ? Check IV for any infiltration  ? Foley bag is empty if patient has a Foley   ? Tubing and IV bags are labeled   S  Safety   ? Room is clean, patient is clean, and equipment is clean.   ? Hallways are clear from equipment besides carts.   ? Fall bracelet on for fall risk patients  ? Ensure room is clear and free of clutter  ? Suction is set up for ALL patients (with yanker)  ? Hallways are clear from equipment besides carts.   ? Isolation precautions followed, supplies available outside room, sign posted     [name] BertholdZadie M Van Juarez

## 2015-11-22 NOTE — Other (Addendum)
TRANSFER - OUT REPORT:    Verbal report given to Sue LushAndrea RN(name) on Eastman ChemicalKatherine Andringa  being transferred to [address](unit) for routine progression of care       Report consisted of patient???s Situation, Background, Assessment and   Recommendations(SBAR).     Information from the following report(s) SBAR, Kardex, Intake/Output, MAR and Recent Results was reviewed with the receiving nurse.    Lines:   Peripheral IV 11/21/15 Left Forearm (Active)   Site Assessment Clean, dry, & intact 11/22/2015  7:10 PM   Phlebitis Assessment 0 11/22/2015  7:10 PM   Infiltration Assessment 0 11/22/2015  7:10 PM   Dressing Status Clean, dry, & intact 11/22/2015  7:10 PM   Dressing Type Transparent 11/22/2015  7:10 PM   Hub Color/Line Status Pink;Infusing 11/22/2015  7:10 PM        Opportunity for questions and clarification was provided.      Patient transported with:   Tech          Patient transferred to room 454, per bed, awake, alert and confused. IV fluid held for transport.

## 2015-11-22 NOTE — Progress Notes (Signed)
Problem: Self Care Deficits Care Plan (Adult)  Goal: *Acute Goals and Plan of Care (Insert Text)  Outcome: Resolved/Met Date Met:  11/22/15  OCCUPATIONAL THERAPY EVALUATION/DISCHARGE     [name] (82 y.o. female)  Date: 11/22/2015  Primary Diagnosis: Chest pain  Hypomagnesemia  Weakness  Chest pain        Precautions:  Fall, Skin      ASSESSMENT AND RECOMMENDATIONS:  Based on the objective data described below, the patient was supine in bed upon arrival. Patient was A&O to name only. Patient sat EOB with supervision assistance for bed mobility. Patient's BUE AROM was WFL; BUE strength was decreased but functional. Patient simulated LE dressing tasks with supervision/CGA seated EOB. Patient simulated BSC trasnsfer with CGA. Patient reported she lives with her daughter who can help with all ADLs as needed. Patient shows no limitations in self care at this time. Deferred to PT for mobility.    Skilled acute care occupational therapy is not indicated at this time.  Discharge Recommendations: Home Health with supervision  Further Equipment Recommendations for Discharge: shower chair       COMPLEXITY      Eval Complexity: History: LOW Complexity : Brief history review ; Examination: LOW Complexity : 1-3 performance deficits relating to physical, cognitive , or psychosocial skils that result in activity limitations and / or participation restrictions ; Decision Making:LOW Complexity : No comorbidities that affect functional and no verbal or physical assistance needed to complete eval tasks  Assessment: Low Complexity          G-CODES:      Self Care [phone] Current  CI= 1-19%  [id number] Goal  CI= 1-19%  [id number] D/C  CI= 1-19%.  The severity rating is based on the Level of Assistance required for Functional Mobility and ADLs.      SUBJECTIVE:   Patient stated ???I can do it, but if I can't my daughter does it.???      OBJECTIVE DATA SUMMARY:       Past Medical History:   Diagnosis Date   ??? Arthritis      ??? Diabetes ([place])     ??? Hypertension       Past Surgical History:   Procedure Laterality Date   ??? HX GYN         hysterectomy     Prior Level of Function/Home Situation: Patient is a poor historian. Patient reported that she was independent in self care tasks and functional mobility PTA.  Home Situation  Home Environment: Private residence  # Steps to Enter: 0  One/Two Story Residence: One story  Living Alone: No  Support Systems: Child(ren)  Patient Expects to be Discharged to:: Private residence  Current DME Used/Available at Home: Environmental consultant, rollator, Radio producer, straight (adult child)  Tub or Shower Type: Tub/Shower combination  '[X]'      Right hand dominant       '[ ]'      Left hand dominant  Cognitive/Behavioral Status:  Neurologic State: Alert  Orientation Level: Oriented to person;Disoriented to time;Disoriented to situation;Disoriented to place  Cognition: Follows commands     Skin: No skin changes noted     Edema: No edema noted     Vision/Perceptual:    Acuity: Within Defined Limits (Not formally assessed)       Coordination:  Coordination: Within functional limits (BUEs)     Balance:  Sitting: Impaired  Sitting - Static: Good (unsupported)  Sitting - Dynamic: Fair (occasional)  Standing: Impaired;Without support  Standing - Static: Fair  Standing - Dynamic : Fair     Strength:  Strength: Generally decreased, functional (BUEs)     Tone & Sensation:  Tone: Normal (BUEs)  Sensation: Intact (BUEs)     Range of Motion:  AROM: Within functional limits (BUEs)     Functional Mobility and Transfers for ADLs:  Bed Mobility:  Rolling: Supervision  Supine to Sit: Supervision  Sit to Supine: Contact guard assistance  Scooting: Supervision  Transfers:  Sit to Stand: Contact guard assistance              Stand to Sit: Engineer, structural : Contact guard assistance (simulated BSC transfer)     ADL Assessment:  Feeding: Supervision     Oral Facial Hygiene/Grooming: Supervision (seated)      Bathing: Contact guard assistance     Upper Body Dressing: Supervision     Lower Body Dressing: Contact guard assistance     Toileting: Contact guard assistance        Pain:  Pt reports 0/10 pain or discomfort prior to treatment.    Pt reports 0/10 pain or discomfort post treatment.      Activity Tolerance:   Good     Please refer to the flowsheet for vital signs taken during this treatment.  After treatment:   '[ ]'   Patient left in no apparent distress sitting up in chair  '[X]'   Patient left in no apparent distress in bed  '[X]'   Call bell left within reach  '[ ]'   Nursing notified  '[ ]'   Caregiver present  '[ ]'   Bed alarm activated      COMMUNICATION/EDUCATION:   Communication/Collaboration:  '[X]'       Home safety education was provided and the patient/caregiver indicated understanding.  '[X]'       Patient/family have participated as able and agree with findings and recommendations.  '[ ]'       Patient is unable to participate in plan of care at this time.     Susann Givens, OTS  Time Calculation: 16 mins

## 2015-11-22 NOTE — Med Student Progress Note (Signed)
*ATTENTION:  This note has been created by a medical student for educational purposes only.  Please do not refer to the content of this note for clinical decision-making, billing, or other purposes.  Please see attending physician???s note to obtain clinical information on this patient.*     Progress Note  [facility] Service    [name] MRN: [id number]   SSN: [id number]  Date of Birth: 04/22/1922   Age: 82 y.o.  Sex: female      Admit Date: 11/21/2015    LOS: 1 day   Chief Complaint   Patient presents with   ??? Chest Pain   ??? Weight Loss       Subjective:     The patient has no new complaints today. She denies any current chest pain, nausea or vomiting today.          Review of Systems:  Review of Systems - History obtained from the patient  General ROS: negative for - fatigue  Respiratory ROS: negative for - cough or shortness of breath  Cardiovascular ROS: negative for - chest pain or palpitations  Gastrointestinal ROS: negative for - abdominal pain or nausea/vomiting  Genito-Urinary ROS: negative for - urinary frequency/urgency  Neurological ROS: negative for - dizziness or numbness/tingling    Objective:     Vitals:  Visit Vitals   ??? BP 149/69 (BP 1 Location: Left arm, BP Patient Position: At rest)   ??? Pulse 99   ??? Temp 98.8 ??F (37.1 ??C)   ??? Resp 20   ??? Ht 5' (1.524 m)   ??? Wt 88 lb (39.9 kg)   ??? SpO2 99%   ??? BMI 17.19 kg/m2         Physical Exam:   Physical Exam   Constitutional: She is oriented to person, place, and time.   Cardiovascular: Normal rate and regular rhythm.    Murmur heard.   Systolic murmur is present   Pulses:       Dorsalis pedis pulses are 2+ on the right side, and 2+ on the left side.   Murmur radiates to carotids   Pulmonary/Chest: Effort normal and breath sounds normal. No respiratory distress. She has no wheezes. She has no rales.   Abdominal: Soft. Bowel sounds are normal. She exhibits no distension. There is no tenderness.    Neurological: She is alert and oriented to person, place, and time.          Intake and Output:    Intake/Output Summary (Last 24 hours) at 11/22/15 1341  Last data filed at 11/22/15 1008   Gross per 24 hour   Intake             1140 ml   Output              750 ml   Net              390 ml       Lab/Data Review:  Recent Results (from the past 12 hour(s))   CARDIAC PANEL,(CK, CKMB & TROPONIN)    Collection Time: 11/22/15  2:24 AM   Result Value Ref Range    CK 68 26 - 192 U/L    CK - MB 1.5 <3.6 ng/ml    CK-MB Index 2.2 0.0 - 4.0 %    Troponin-I, Qt. 0.06 (H) 0.0 - 0.045 NG/ML   LACTIC ACID    Collection Time: 11/22/15  2:24 AM   Result Value Ref Range  Lactic acid 2.3 (HH) 0.4 - 2.0 MMOL/L   CBC WITH AUTOMATED DIFF    Collection Time: 11/22/15  2:24 AM   Result Value Ref Range    WBC 10.4 4.6 - 13.2 K/uL    RBC 3.81 (L) 4.20 - 5.30 M/uL    HGB 9.3 (L) 12.0 - 16.0 g/dL    HCT 16.1 (L) 09.6 - 45.0 %    MCV 76.4 74.0 - 97.0 FL    MCH 24.4 24.0 - 34.0 PG    MCHC 32.0 31.0 - 37.0 g/dL    RDW 04.5 (H) 40.9 - 14.5 %    PLATELET 218 135 - 420 K/uL    MPV 10.3 9.2 - 11.8 FL    NEUTROPHILS 74 (H) 40 - 73 %    LYMPHOCYTES 14 (L) 21 - 52 %    MONOCYTES 12 (H) 3 - 10 %    EOSINOPHILS 0 0 - 5 %    BASOPHILS 0 0 - 2 %    ABS. NEUTROPHILS 7.7 1.8 - 8.0 K/UL    ABS. LYMPHOCYTES 1.4 0.9 - 3.6 K/UL    ABS. MONOCYTES 1.2 0.05 - 1.2 K/UL    ABS. EOSINOPHILS 0.0 0.0 - 0.4 K/UL    ABS. BASOPHILS 0.0 0.0 - 0.1 K/UL    DF AUTOMATED     METABOLIC PANEL, BASIC    Collection Time: 11/22/15  2:24 AM   Result Value Ref Range    Sodium 142 136 - 145 mmol/L    Potassium 2.7 (LL) 3.5 - 5.5 mmol/L    Chloride 103 100 - 108 mmol/L    CO2 28 21 - 32 mmol/L    Anion gap 11 3.0 - 18 mmol/L    Glucose 93 74 - 99 mg/dL    BUN 13 7.0 - 18 MG/DL    Creatinine 8.11 0.6 - 1.3 MG/DL    BUN/Creatinine ratio 17 12 - 20      GFR est AA >60 >60 ml/min/1.29m2    GFR est non-AA >60 >60 ml/min/1.48m2    Calcium 8.7 8.5 - 10.1 MG/DL   MAGNESIUM     Collection Time: 11/22/15  2:24 AM   Result Value Ref Range    Magnesium 1.8 1.6 - 2.6 mg/dL   GLUCOSE, POC    Collection Time: 11/22/15  7:09 AM   Result Value Ref Range    Glucose (POC) 93 70 - 110 mg/dL   EKG, 12 LEAD, INITIAL    Collection Time: 11/22/15  7:33 AM   Result Value Ref Range    Ventricular Rate 94 BPM    Atrial Rate 94 BPM    P-R Interval 118 ms    QRS Duration 72 ms    Q-T Interval 350 ms    QTC Calculation (Bezet) 437 ms    Calculated P Axis 59 degrees    Calculated R Axis 32 degrees    Calculated T Axis -128 degrees    Diagnosis       Normal sinus rhythm  ST & T wave abnormality, consider inferolateral ischemia  Abnormal ECG  When compared with ECG of 21-Nov-2015 13:22,  premature atrial complexes are no longer present  Confirmed by Doristine Johns, MD, Ryan ([id number]) on 11/22/2015 10:46:12 AM     LACTIC ACID    Collection Time: 11/22/15  9:15 AM   Result Value Ref Range    Lactic acid 1.1 0.4 - 2.0 MMOL/L   GLUCOSE, POC    Collection Time: 11/22/15 11:17 AM  Result Value Ref Range    Glucose (POC) 207 (H) 70 - 110 mg/dL           Assessment and Plan:     1 Gram negative sepsis due to UTI: Patient's urine culture showed greater than 100,000 colonies/mL of gram negative rods. Continue Zosyn.     2 Chest pain: Patient denies having any chest pain or palpitations today. This morning's EKG continues to show ST and T wave abnormalities. Systolic heart murmur radiating to the carotid arteries suggests aortic stenosis or sclerosis. Given these findings will get an echo.     3 Nausea and vomiting: Patient has not had any nausea or vomiting today. Will continue Zofran prn.       Diet Diabetic   DVT Prophylax Heparin                     Alger Simonsmma L Rudebusch , MS3   November 22, 2015

## 2015-11-22 NOTE — Other (Signed)
TRANSFER - IN REPORT:    Verbal report received from Lucy, RN(name) on [name] Juarez  being received from [facility] (unit) for routine progression of care      Report consisted of patient???s Situation, Background, Assessment and   Recommendations(SBAR).     Information from the following report(s) SBAR                                                 was reviewed with the receiving nurse.    Opportunity for questions and clarification was provided.      Assessment completed upon patient???s arrival to unit and care assumed.

## 2015-11-22 NOTE — Progress Notes (Signed)
Problem: Mobility Impaired (Adult and Pediatric)  Goal: *Acute Goals and Plan of Care (Insert Text)  STG???s to be addressed within 3 days:  1. Bed mobility: Supine to sit to supine S with HR for meals.  2. Activity tolerance: Tolerate up in chair 1-2 hrs for ADL???s.  3. Transfers: Sit to stand to chair S with LRAD for ADL???s.    LTG???s to be addressed within 7 days:  1. Standing/Ambulation Balance: Increase to Good with LRAD for safe transfers and gait.  2. Ambulation: Ambulate > 150 ft. S with LRAD for home mobility.  3. Patient Education: Independent with HEP for home safety.   PHYSICAL THERAPY TREATMENT     [name] (09(82 y.o. female)  Date: 11/22/2015  Diagnosis: Chest pain  Hypomagnesemia  Weakness  Chest pain Sepsis [facility](HCC)       Precautions: Fall  Chart, physical therapy assessment, plan of care and goals were reviewed.      ASSESSMENT:  Pt demo's increased activity tolerance with the ability to perform substantially increased functional mobility training over multiple reps with decreased tachycardia.  Pt will benefit from an emphasis on safety awareness with performance of functional transfers to minimize fall risk.  Pt progressing toward all goals.  Progression toward goals:  [X]       Improving appropriately and progressing toward goals  [X]       Improving slowly and progressing toward goals  [ ]       Not making progress toward goals and plan of care will be adjusted       PLAN:  Patient continues to benefit from skilled intervention to address the above impairments.  Continue treatment per established plan of care.  Discharge Recommendations:  Home Health  Further Equipment Recommendations for Discharge:  rolling walker and N/A       SUBJECTIVE:   Patient stated ???I can't do too much walking.???      OBJECTIVE DATA SUMMARY:   Critical Behavior:  Neurologic State: Alert  Orientation Level: Oriented to person, Oriented to place  Cognition: Follows commands   Safety/Judgement: Fall prevention, Decreased awareness of environment, Decreased insight into deficits  Functional Mobility Training:  Bed Mobility:  Rolling: Stand-by asssistance  Supine to Sit: Stand-by asssistance  Sit to Supine: Stand-by asssistance  Scooting: Supervision  Transfers:  Sit to Stand: Contact guard assistance (x's 5 reps)  Stand to Sit: Contact guard assistance  Balance:  Sitting: Impaired;Without support  Sitting - Static: Good (unsupported)  Sitting - Dynamic: Fair (occasional)  Standing: Impaired;With support  Standing - Static: Fair  Standing - Dynamic : Fair  Ambulation/Gait Training:  Distance (ft): 5 Feet (ft) ([facility])  Assistive Device: Walker, rolling  Ambulation - Level of Assistance: Contact guard assistance  Gait Abnormalities: Decreased step clearance  Base of Support: Narrowed  Speed/Cadence: Slow  Interventions: Verbal cues  Pain:  Pt reports 0/10 pain or discomfort prior to treatment.    Pt reports 0/10 pain or discomfort post treatment.   Activity Tolerance:   Fair: Pt tolerates increased activity with decreased tachycardia noted <98 bpm throughout.  Please refer to the flowsheet for vital signs taken during this treatment.  After treatment:   [ ]  Patient left in no apparent distress sitting up in chair  [X]  Patient left in no apparent distress in bed  [X]  Call bell left within reach  [X]  Nursing notified  [ ]  Caregiver present  [ ]  Bed alarm activated  Beckie Busing, PTA   Time Calculation: 23 mins

## 2015-11-22 NOTE — Progress Notes (Signed)
This evening Mrs. [name] Juarez is resting comfortably in bed with her daughter at her bedside. Mrs. [name] Juarez endorsed feeling "bad" and was not able to specify how. Her daughter noted that her mother was a little confused and believed she was at home. Counseled daughter to bring in familiar items to help with the confusion.     On exam, the patient appeared in no acute distress, she was able to recall her name and the month correctly but not the location (believed she was at home). Neurological exam otherwise unremarkable (moving all four extremities freely, negative babinski, no facial droop or slur). Her mental status has improved from yesterday, when she could not recall the examiner. Chest pain on palpation. The patients confusion is likely 2/2 her illness and hospitalization. She has severe hearing loss and is at risk for confusion.

## 2015-11-22 NOTE — Progress Notes (Signed)
Echocardiogram completed. Report to follow. CS

## 2015-11-22 NOTE — Other (Signed)
GLYCEMIC CONTROL PLAN OF CARE    Assessment:  Pt is 82 yr old female admitted on 11/21/15 for evaluation for N/V after meals. Pt with past medical history significant for hearing loss (bilateral), T2DM, HTN.    Recommendations:  Continue correctional lispro. Recommend order HbA1c to determine past BG management.     Diabetic education.    Will continue to monitor inpatient for intervention.    Most recent blood glucose values:  Results for [name], Joyice (MRN [id number]) as of 11/22/2015 15:01   Ref. Range 11/21/2015 17:19 11/21/2015 21:32 11/22/2015 07:09 11/22/2015 11:17   GLUCOSE,FAST - POC Latest Ref Range: 70 - 110 mg/dL 409124 (H) 811182 (H) 93 914207 (H)     Current A1C:  None noted but per H&P 6.6%    Current hospital diabetes medications:  Correctional lispro ACHS- very insulin resistant scale    Previous day Insulin TDD:  7/18: 3 units lispro    Diet:   Consistent carbohydrate with glucerna lunch and dinner  Current Meal Intake:  Patient Vitals for the past 100 hrs:   % Diet Eaten   11/22/15 1008 100 %       Home diabetes medications:  actoplus metformin 15-850 mg two times daily with meals    Goals:  Pt BG will be within target range by 7/22/17_____    Education:  _x__  Refer to Diabetes Education Record- pt at echo at time of visit and no family present, daughter present for H&P interview             ___  Education not indicated at this time    Francis GainesAlison Helm, MS, RD, CDE  Pager: [phone]

## 2015-11-22 NOTE — Other (Signed)
Bedside and Verbal shift change report given to Arlyce Dice ERmen RN/Karen Martha Clan RN (oncoming nurse) by Leitha Bleak Sumulong (offgoing nurse). Report included the following information SBAR, Kardex, MAR and Recent Results.    SITUATION:   ? Code Status: Full Code  ? Reason for Admission: Chest pain  ? Hypomagnesemia  ? Weakness  ? Chest pain    ? Hospital day: 1  ? Problem List:       Hospital Problems  Never Reviewed          Codes Class Noted POA    Hypomagnesemia ICD-10-CM: E83.42  ICD-9-CM: 275.2  11/21/2015 Unknown        Chest pain ICD-10-CM: R07.9  ICD-9-CM: 786.50  11/21/2015 Unknown        Weakness ICD-10-CM: R53.1  ICD-9-CM: 780.79  11/21/2015 Unknown        * (Principal)Sepsis (HCC) ICD-10-CM: A41.9  ICD-9-CM: 038.9, 995.91  11/21/2015 Yes              BACKGROUND:    Past Medical History:   Past Medical History:   Diagnosis Date   ??? Arthritis    ??? Diabetes (HCC)    ??? Hypertension          Patient taking anticoagulants yes /heparin    ASSESSMENT:   ? Changes in Assessment Throughout Shift: none    ? Patient has Central Line: no Reasons if yes: na  ? Patient has Foley Cath: no Reasons if yes: na     ? Last Vitals:     Vitals:    11/21/15 1949 11/21/15 2328 11/22/15 0418 11/22/15 0422   BP: 140/59 130/46 148/73    Pulse: 95 94 (!) 101    Resp: Temp: 97.8 ??F (36.6 ??C) 97.8 ??F (36.6 ??C) 98.2 ??F (36.8 ??C)    SpO2: 100% 100% 99%    Weight:    39.9 kg (88 lb)   Height:           ? IV and DRAINS (will only show if present)   Peripheral IV 11/21/15 Left Forearm-Site Assessment: Clean, dry, & intact    ? WOUND (if present)   Wound Type:  none   Dressing present Dressing Present : No   Wound Concerns/Notes:  none    ? PAIN    Pain Assessment    Pain Intensity 1: 0 (11/22/15 0421)              Patient Stated Pain Goal: 0  o Interventions for Pain:  none  o Intervention effective: na  o Time of last intervention: na   o Reassessment Completed: yes     ? Last 3 Weights:  Last 3 Recorded Weights in this Encounter     11/21/15 0940 11/22/15 0422   Weight: 38.1 kg (84 lb) 39.9 kg (88 lb)     Weight change:     ? INTAKE/OUPUT    Current Shift: 07/18 1901 - 07/19 0700  In: -   Out: 600 [Urine:600]    Last three shifts:      ? LAB RESULTS     Recent Labs      11/22/15   0224  11/21/15   0935   WBC  10.4  9.3   HGB  9.3*  10.7*   HCT  29.1*  33.5*   PLT  218  219        Recent Labs      11/22/15  0224  11/21/15   0935   NA  142  137   K  2.7*  3.4*   GLU  93  199*   BUN  13  22*   CREA  0.75  0.92   CA  8.7  9.4   MG  1.8  1.5*       RECOMMENDATIONS AND DISCHARGE PLANNING     1. Pending tests/procedures/ Plan of Care or Other Needs: continue to monitor     2. Discharge plan for patient and Needs/Barriers: home    3. Estimated Discharge Date: pending Posted on Whiteboard in Patient???s Room: yes      4. The patient's care plan was reviewed with the oncoming nurse.       "HEALS" SAFETY CHECK      Fall Risk    Total Score: 3    Safety Measures: Safety Measures: Bed/Chair alarm on, Bed/Chair-Wheels locked, Bed in low position, Call light within reach, Fall prevention (comment), Gripper socks, Side rails X 3    A safety check occurred in the patient's room between off going nurse and oncoming nurse listed above.    The safety check included the below items  Area Items   H  High Alert Medications ? Verify all high alert medication drips (heparin, PCA, etc.)   E  Equipment ? Suction is set up for ALL patients (with yanker)  ? Red plugs utilized for all equipment (IV pumps, etc.)  ? WOW???s wiped down at end of shift.  ? Room stocked with oxygen, suction, and other unit-specific supplies   A  Alarms ? Bed alarm is set for fall risk patients  ? Ensure chair alarm is in place and activated if patient is up in a chair   L  Lines ? Check IV for any infiltration  ? Foley bag is empty if patient has a Foley   ? Tubing and IV bags are labeled   S  Safety   ? Room is clean, patient is clean, and equipment is clean.   ? Hallways are clear from equipment besides carts.   ? Fall bracelet on for fall risk patients  ? Ensure room is clear and free of clutter  ? Suction is set up for ALL patients (with yanker)  ? Hallways are clear from equipment besides carts.   ? Isolation precautions followed, supplies available outside room, sign posted     Leitha BleakLucila M Sumulong

## 2015-11-22 NOTE — Other (Signed)
Pt arrived on unit by stretcher pt was sitting up and appeared to be in no distress; pt somewhat confused and trying to get out of bed stating " I need to go see my daughter" RN tried to re-orient the pt and let her know that she is in the hospital and must remain in the bed.

## 2015-11-22 NOTE — Other (Signed)
Bedside and Verbal shift change report given to Doreene BurkeSherita, Charity fundraiserN (oncoming nurse) by Josephina ShihAndrea Townsend,RN (offgoing nurse). Report included the following information SBAR, Kardex, MAR and Recent Results.    SITUATION:   ? Code Status: Full Code  Reason for Admission: Chest pain  Hypomagnesemia  Weakness  ? Chest pain    ? Hospital day: 1  ? Problem List:       Hospital Problems  Never Reviewed          Codes Class Noted POA    Hypomagnesemia ICD-10-CM: E83.42  ICD-9-CM: 275.2  11/21/2015 Unknown        Chest pain ICD-10-CM: R07.9  ICD-9-CM: 786.50  11/21/2015 Unknown        Weakness ICD-10-CM: R53.1  ICD-9-CM: 780.79  11/21/2015 Unknown        * (Principal)Sepsis (HCC) ICD-10-CM: A41.9  ICD-9-CM: 038.9, 995.91  11/21/2015 Yes              BACKGROUND:    Past Medical History:   Past Medical History:   Diagnosis Date   ??? Arthritis    ??? Diabetes (HCC)    ??? Hypertension          Patient taking anticoagulants yes     ASSESSMENT:   ? Changes in Assessment Throughout Shift: Stable      ? Patient has Central Line: no Reasons if yes:   ? Patient has Foley Cath: no Reasons if yes:      ? Last Vitals:     Vitals:    11/22/15 0701 11/22/15 1119 11/22/15 1753 11/22/15 1948   BP: 141/53 149/69 (!) 133/101 174/65   Pulse: 91 99 86 83   Resp: 20 20 18 18    Temp: 98.6 ??F (37 ??C) 98.8 ??F (37.1 ??C) 98.7 ??F (37.1 ??C) 98.7 ??F (37.1 ??C)   SpO2: 100% 99% 98% 100%   Weight:       Height:           ? IV and DRAINS (will only show if present)   Peripheral IV 11/21/15 Left Forearm-Site Assessment: Clean, dry, & intact    ? WOUND (if present)   Wound Type:  none   Dressing present Dressing Present : No   Wound Concerns/Notes:      ? PAIN    Pain Assessment    Pain Intensity 1: 0 (11/22/15 2210)              Patient Stated Pain Goal: 0  o Interventions for Pain:  none  o Intervention effective:   o Time of last intervention:    o Reassessment Completed: yes     ? Last 3 Weights:  Last 3 Recorded Weights in this Encounter    11/21/15 0940 11/22/15 0422    Weight: 38.1 kg (84 lb) 39.9 kg (88 lb)     Weight change:     ? INTAKE/OUPUT    Current Shift: 07/19 1901 - 07/20 0700  In: -   Out: 800 [Urine:800]    Last three shifts: 07/18 0701 - 07/19 1900  In: 2720 [P.O.:920; I.V.:1800]  Out: 1250 [Urine:1250]    ? LAB RESULTS     Recent Labs      11/22/15   0224  11/21/15   0935   WBC  10.4  9.3   HGB  9.3*  10.7*   HCT  29.1*  33.5*   PLT  218  219        Recent Labs  11/22/15   1715  11/22/15   0224  11/21/15   0935   NA   --   142  137   K  4.2  2.7*  3.4*   GLU   --   93  199*   BUN   --   13  22*   CREA   --   0.75  0.92   CA   --   8.7  9.4   MG   --   1.8  1.5*       RECOMMENDATIONS AND DISCHARGE PLANNING     1. Pending tests/procedures/ Plan of Care or Other Needs: none at this time     2. Discharge plan for patient and Needs/Barriers:     3. Estimated Discharge Date: 11/23/15 Posted on Whiteboard in Patient???s Room: yes      4. The patient's care plan was reviewed with the oncoming nurse.       "HEALS" SAFETY CHECK      Fall Risk    Total Score: 4    Safety Measures: Safety Measures: Bed/Chair alarm on, Bed/Chair-Wheels locked, Bed in low position, Call light within reach, Side rails X 3    A safety check occurred in the patient's room between off going nurse and oncoming nurse listed above.    The safety check included the below items  Area Items   H  High Alert Medications ? Verify all high alert medication drips (heparin, PCA, etc.)   E  Equipment ? Suction is set up for ALL patients (with yanker)  ? Red plugs utilized for all equipment (IV pumps, etc.)  ? WOW???s wiped down at end of shift.  ? Room stocked with oxygen, suction, and other unit-specific supplies   A  Alarms ? Bed alarm is set for fall risk patients  ? Ensure chair alarm is in place and activated if patient is up in a chair   L  Lines ? Check IV for any infiltration  ? Foley bag is empty if patient has a Foley   ? Tubing and IV bags are labeled   S  Safety    ? Room is clean, patient is clean, and equipment is clean.  ? Hallways are clear from equipment besides carts.   ? Fall bracelet on for fall risk patients  ? Ensure room is clear and free of clutter  ? Suction is set up for ALL patients (with yanker)  ? Hallways are clear from equipment besides carts.   ? Isolation precautions followed, supplies available outside room, sign posted     Veneda Melter, RN

## 2015-11-22 NOTE — Progress Notes (Signed)
NUTRITION    Nursing Referral: [facility]  Nutrition Consult: General Nutrition Management & Supplements     RECOMMENDATIONS / PLAN:     - Add supplements: Glucerna Shake BID.   - Continue RD inpatient monitoring and evaluation.     NUTRITION INTERVENTIONS & DIAGNOSIS:      Meals/Snacks: modified diet   Medical food supplementation: initiate      Nutrition Diagnosis: Inadequate oral intake related to decreased appetite as evidenced by pt with variable meal intake, consuming 50% or less of meals.     ASSESSMENT:     Pt tolerating diet, agreeable to supplements. Ate <50% of lunch.     Average po intake adequate to meet patients estimated nutritional needs:    Yes      No    Unable to determine at this time    Diet: DIET DIABETIC CONSISTENT CARB Regular      Food Allergies: NKFA  Current Appetite:    Good      Fair      Poor      Other:  Appetite/meal intake prior to admission:    Good      Fair      Poor      Other:  Feeding Limitations:   Swallowing difficulty: SLP following     Chewing difficulty     Other:  Current Meal Intake: Patient Vitals for the past 100 hrs:   % Diet Eaten   11/22/15 1008 100 %     BM: PTA   Skin Integrity: WDL  Edema: none   Pertinent Medications: Reviewed    Recent Labs      11/22/15   0224  11/21/15   0935   NA  142  137   K  2.7*  3.4*   CL  103  98*   CO2  28  29   GLU  93  199*   BUN  13  22*   CREA  0.75  0.92   CA  8.7  9.4   MG  1.8  1.5*   ALB   --   4.4   SGOT   --   15   ALT   --   22       Intake/Output Summary (Last 24 hours) at 11/22/15 1408  Last data filed at 11/22/15 1008   Gross per 24 hour   Intake             1140 ml   Output              750 ml   Net              390 ml       Anthropometrics:  Ht Readings from Last 1 Encounters:   11/21/15 5' (1.524 m)     Last 3 Recorded Weights in this Encounter    11/21/15 0940 11/22/15 0422   Weight: 38.1 kg (84 lb) 39.9 kg (88 lb)     Body mass index is 17.19 kg/(m^2).      Underweight     Weight History: pt unsure of weight history      Weight Metrics 11/22/2015 11/21/2015   Weight 88 lb -   BMI - 17.19 kg/m2        Admitting Diagnosis: Chest pain  Hypomagnesemia  Weakness  Chest pain  Pertinent PMHx: DM, HTN    Education Needs:         None identified  Identified - Not appropriate at this time    Identified and addressed - refer to education log  Learning Limitations:    None identified   Identified    Cultural, religious & ethnic food preferences:   None identified     Identified and addressed     ESTIMATED NUTRITION NEEDS:     Calories: 1022-1180 kcal (MSJx1.3-1.5) based on   Actual BW       IBW 45 kg  Protein: 36-45 gm (0.8-1 gm/kg) based on   Actual BW       IBW   Fluid: 1 mL/kcal     MONITORING & EVALUATION:     Nutrition Goal(s):   1. Po intake of meals will meet >75% of patient estimated nutritional needs within the next 7 days.  Outcome:   Met/Ongoing      Not Met     New/Initial Goal     Monitoring:    Diet tolerance    Meal intake    Supplement intake    GI symptoms/ability to tolerate po diet    Respiratory status    Plan of care      Previous Recommendations (for follow-up assessments only):        Implemented          Not Implemented (RD to address)      No Recommendation Made     Discharge Planning: cardiac, diabetic diet    Participated in care planning, discharge planning, & interdisciplinary rounds as appropriate      Chaney Malling, [place], [place]   Pager: [phone]

## 2015-11-22 NOTE — Other (Signed)
Received patient in bed, awake, alert and oriented with periods of confusion. HOH. IV fluid infusing.. No complaints made, will continue to monitor.

## 2015-11-22 NOTE — Progress Notes (Signed)
Problem: Dysphagia (Adult)  Goal: *Acute Goals and Plan of Care (Insert Text)  Rec:   Reg diet with thin liquids  Aspiration precautions  Oral care TID  Meds as tolerated  Outcome: Resolved/Met Date Met:  11/22/15  SPEECH LANGUAGE PATHOLOGY BEDSIDE SWALLOW EVALUATION/DISCHARGE     [name] 82 y.o. female)  Date: 11/22/2015  Primary Diagnosis: Chest pain  Hypomagnesemia  Weakness  Chest pain        Precautions: aspiration  Fall      ASSESSMENT :  Based on the objective data described below, the patient presents with oropharyngeal swallow fxn essentially WFL. Strength, ROM, and coordination of the orofacial musculature were all found to be [facility]. Limited natural dentition noted. The pt presented with adequate oral transit times on all consistencies. Mastication time was appropriate. No s/sx of aspiration noted; hyolaryngeal elevation and excursion appeared adequate on all consistencies. No oral stasis noted post swallow. The pt denied globus sensation post swallow. CXR was clear. No further skilled SLP services are indicated at this time. Please re-consult if needed.        PLAN :  Recommendations and Planned Interventions: See above  Discharge Recommendations: [place] and To Be Determined       SUBJECTIVE:   Patient stated ???Everything goes down just fine, honey???.      OBJECTIVE:       Past Medical History:   Diagnosis Date   ??? Arthritis     ??? Diabetes ([place])     ??? Hypertension       Past Surgical History:   Procedure Laterality Date   ??? HX GYN         hysterectomy     Prior Level of Function/Home Situation: private residence  Home Situation  Home Environment: Private residence  # Steps to Enter: 0  One/Two Story Residence: One story  Living Alone: No  Support Systems: Family member(s), Friends \\ neighbors  Patient Expects to be Discharged to:: Private residence  Current DME Used/Available at Home: Environmental consultant, rollator, Radio producer, straight (adult child)   Diet prior to admission: reg with thin  Current Diet:  Reg with thin   Cognitive and Communication Status:  Neurologic State: Alert  Orientation Level: Oriented to person, Oriented to place  Cognition: Follows commands  Perception: Appears intact  Perseveration: No perseveration noted  Safety/Judgement: Fall prevention, Decreased awareness of environment, Decreased insight into deficits  Oral Assessment:  Oral Assessment  Labial: No impairment  Dentition: Natural;Limited;Intact  Oral Hygiene: Adequate  Lingual: No impairment  Velum: No impairment  Mandible: No impairment  P.O. Trials:  Patient Position: [facility] at [facility]  Vocal quality prior to P.O.: No impairment  Consistency Presented: Thin liquid;Solid;Puree  How Presented: Self-fed/presented;Cup/sip;Spoon;Straw   Bolus Acceptance: No impairment  Bolus Formation/Control: No impairment  Propulsion: No impairment  Oral Residue: None  Initiation of Swallow: No impairment  Laryngeal Elevation: Functional  Aspiration Signs/Symptoms: None  Pharyngeal Phase Characteristics: No impairment, issues, or problems   Effective Modifications: None  Cues for Modifications: None     Oral Phase Severity: No impairment  Pharyngeal Phase Severity : No impairment     GCODESwallowing: G8996 Swallow Current Status CH= 0%  G8997 Swallow Goal Status CH= 0%  G8998 Swallow D/C Status CH= 0%     The severity rating is based on the following outcomes:             Clinical Judgment     Pain:  Pt c/o  0/10 pain prior to evaluation.  Pt c/o 0/10 pain post evaluation.     After treatment:   '[ ]'             Patient left in no apparent distress sitting up in chair  '[X]'             Patient left in no apparent distress in bed  '[X]'             Call bell left within reach  '[X]'             Nursing notified  '[ ]'             Caregiver present  '[ ]'             Bed alarm activated      COMMUNICATION/EDUCATION:   '[X]'             SLP educated pt with regard to compensatory swallow strategies and                   aspiration/reflux precautions including: small bites/sips,                  alternate liquids/solids, decrease feeding rate, HOB > 45 with all po, and                             upright in bed at 30 degrees after po for at least 45 minutes.   '[X]'             Patient/family have participated as able in goal setting and plan of care.     Thank you for this referral.     Eddie North, M.S. CCC-SLP/L  Speech-Language Pathologist

## 2015-11-23 LAB — CBC WITH AUTOMATED DIFF
ABS. BASOPHILS: 0 10*3/uL (ref 0.0–0.06)
ABS. EOSINOPHILS: 0 10*3/uL (ref 0.0–0.4)
ABS. LYMPHOCYTES: 0.6 10*3/uL — ABNORMAL LOW (ref 0.9–3.6)
ABS. MONOCYTES: 1 10*3/uL (ref 0.05–1.2)
ABS. NEUTROPHILS: 7.7 10*3/uL (ref 1.8–8.0)
BASOPHILS: 0 % (ref 0–2)
EOSINOPHILS: 0 % (ref 0–5)
HCT: 31.4 % — ABNORMAL LOW (ref 35.0–45.0)
HGB: 9.9 g/dL — ABNORMAL LOW (ref 12.0–16.0)
LYMPHOCYTES: 6 % — ABNORMAL LOW (ref 21–52)
MCH: 24.8 PG (ref 24.0–34.0)
MCHC: 31.5 g/dL (ref 31.0–37.0)
MCV: 78.5 FL (ref 74.0–97.0)
MONOCYTES: 11 % — ABNORMAL HIGH (ref 3–10)
MPV: 10.7 FL (ref 9.2–11.8)
NEUTROPHILS: 83 % — ABNORMAL HIGH (ref 40–73)
PLATELET: 183 10*3/uL (ref 135–420)
RBC: 4 M/uL — ABNORMAL LOW (ref 4.20–5.30)
RDW: 14.7 % — ABNORMAL HIGH (ref 11.6–14.5)
WBC: 9.2 10*3/uL (ref 4.6–13.2)

## 2015-11-23 LAB — CULTURE, URINE: Culture result:: >100000 — AB

## 2015-11-23 LAB — METABOLIC PANEL, BASIC
Anion gap: 9 mmol/L (ref 3.0–18)
BUN/Creatinine ratio: 16 (ref 12–20)
BUN: 9 MG/DL (ref 7.0–18)
CO2: 27 mmol/L (ref 21–32)
Calcium: 8.8 MG/DL (ref 8.5–10.1)
Chloride: 106 mmol/L (ref 100–108)
Creatinine: 0.57 MG/DL — ABNORMAL LOW (ref 0.6–1.3)
GFR est AA: >60 mL/min/{1.73_m2} (ref >60)
GFR est non-AA: >60 mL/min/{1.73_m2} (ref >60)
Glucose: 100 mg/dL — ABNORMAL HIGH (ref 74–99)
Potassium: 3.9 mmol/L (ref 3.5–5.5)
Sodium: 142 mmol/L (ref 136–145)

## 2015-11-23 LAB — MAGNESIUM: Magnesium: 1.5 mg/dL — ABNORMAL LOW (ref 1.6–2.6)

## 2015-11-23 LAB — GLUCOSE, POC
Glucose (POC): 215 mg/dL — ABNORMAL HIGH (ref 70–110)
Glucose (POC): 141 mg/dL — ABNORMAL HIGH (ref 70–110)
Glucose (POC): 254 mg/dL — ABNORMAL HIGH (ref 70–110)

## 2015-11-23 MED ORDER — LEVOFLOXACIN 250 MG TAB
250 mg | ORAL | Status: DC
Start: 2015-11-23 — End: 2015-11-24
  Administered 2015-11-23 – 2015-11-24 (×2): via ORAL

## 2015-11-23 MED ORDER — MAGNESIUM SULFATE 2 GRAM/50 ML IVPB
2 gram/50 mL (4 %) | Freq: Once | INTRAVENOUS | Status: AC
Start: 2015-11-23 — End: 2015-11-23
  Administered 2015-11-23: 16:00:00 via INTRAVENOUS

## 2015-11-23 MED FILL — INSULIN LISPRO 100 UNIT/ML INJECTION: 100 unit/mL | SUBCUTANEOUS | Qty: 1

## 2015-11-23 MED FILL — METOPROLOL SUCCINATE SR 50 MG 24 HR TAB: 50 mg | ORAL | Qty: 2

## 2015-11-23 MED FILL — FERROUS SULFATE 325 MG (65 MG ELEMENTAL IRON) TAB: 325 mg (65 mg iron) | ORAL | Qty: 1

## 2015-11-23 MED FILL — PROBIOTIC (S.BOULARDII) 250 MG CAPSULE: 250 mg | ORAL | Qty: 1

## 2015-11-23 MED FILL — HEPARIN (PORCINE) 5,000 UNIT/ML IJ SOLN: 5000 unit/mL | INTRAMUSCULAR | Qty: 1

## 2015-11-23 MED FILL — LEVOFLOXACIN 250 MG TAB: 250 mg | ORAL | Qty: 1

## 2015-11-23 MED FILL — LISINOPRIL-HYDROCHLOROTHIAZIDE 20 MG-12.5 MG TAB: ORAL | Qty: 2

## 2015-11-23 MED FILL — AMLODIPINE 10 MG TAB: 10 mg | ORAL | Qty: 1

## 2015-11-23 MED FILL — MAGNESIUM SULFATE 2 GRAM/50 ML IVPB: 2 gram/50 mL (4 %) | INTRAVENOUS | Qty: 50

## 2015-11-23 MED FILL — PIPERACILLIN-TAZOBACTAM 2.25 GRAM IV SOLR: 2.25 gram | INTRAVENOUS | Qty: 2.25

## 2015-11-23 MED FILL — CLONIDINE 0.1 MG TAB: 0.1 mg | ORAL | Qty: 1

## 2015-11-23 NOTE — Consults (Addendum)
[facility] - Consult Note    Consultation request by Lurlean Leyden, MD for advice/opinion related to evaluating Chest pain;Hypomagnesemia;Weakness;Chest pain    Date of  Admission: 11/21/2015  9:19 AM   Primary Care Physician:  Pershing Proud, MD     The patient was seen, examined, and independently evaluated and I agree with the below assessment and plan by Eliot Ford, PA-C with the following comments. This lady's EKG which was abnormal on admission and remains mildly abnormal is most consistent with the hypokalemia which she had on admission and her troponin's are in low intermediate range and flat, and her echocardiogram shows completely normal LV function without wall motion abnormalities, so I would simply continue to treat for dehydration and her infectious process as well as betablocker therapy with possible addition of aspirin with further recommendations pending course.       Assessment:     Patient Active Problem List   Diagnosis Code   ??? Hypomagnesemia E83.42   ??? Chest pain R07.9   ??? Weakness R53.1   ??? Sepsis (HCC) A41.9       -Presented with nausea and vomiting with progressive weight loss.  -Encephalopathy with agitation, currently in restraints.  -Sepsis from urinary tract source, improving with abx and hydration.  -Abnormal echocardiogram. Hyperdynamic LV function EF 65-70% with dynamic LV midcavity obliteration. Likely related to patients volume depletion.  -Chest pain. Troponin indeterminate and not consistent with ACS. ECG however abnormal with inferolateral T wave abnormalities which are reportably new however no tracings available for direct review.  -Anemia stable overall.  -Electrolyte imbalance with hypokalemia and hypomagnesemia being replaced.  -Diabetes mellitus.  -Hypertension.  -Hearing loss.     Plan:     Would avoid dehydration and maximize BB therapy. Would continue hydration with electrolyte replacement as needed. Would encourage oral intake.   Despite abnormal ECG, patient ruled out for MI and echocardiogram does not reveal WMA. In setting of advanced age with confusion and agitation, would attempt conservative therapy from cardiac standpoint unless developing unstable cardiac symptoms. Would recommend ASA if without bleeding concerns. Would check lipid panel and start statin if indicated. Would continue BB.  Would continue treatment of underlying infectious and GI processes.     History of Present Illness:     This is a 82 y.o. female admitted for Chest pain;Hypomagnesemia;Weakness;Chest pain.      Patient complains of:  N/V. Patient is a 82 year old female who presented to ER with persistent N/V with weight loss over the previous few weeks. Patient was treated with antiemetics and treated for urosepsis and dehydration. Cardiology is asked to comment on abnormal echocardiogram and chest pain. Patient is confused about why she is here but is easily reoriented. She does report nausea and vomiting. She also reports chest burning with and possibly before vomiting. She denies SOB, orthopnea, PND, LEE, palp, syncope. She denies previous cardiac work-up.      Cardiac risk factors:   Advanced age  HTN  Diabetes mellitus.    Review of Symptoms:    Difficult to obtain reliable 10 point ROS due to confusion.     Past Medical History:     Past Medical History:   Diagnosis Date   ??? Arthritis    ??? Diabetes (HCC)    ??? Hypertension          Social History:     Social History     Social History   ??? Marital status: SINGLE  Spouse name: N/A   ??? Number of children: N/A   ??? Years of education: N/A     Social History Main Topics   ??? Smoking status: Never Smoker   ??? Smokeless tobacco: Never Used   ??? Alcohol use No   ??? Drug use: None   ??? Sexual activity: Not Currently     Other Topics Concern   ??? None     Social History Narrative   ??? None        Family History:   History reviewed. No pertinent family history.     Medications:   No Known Allergies      Current Facility-Administered Medications   Medication Dose Route Frequency   ??? magnesium sulfate 2 g/50 ml IVPB (premix or compounded)  2 g IntraVENous ONCE   ??? Saccharomyces boulardii (FLORASTOR) capsule 250 mg  250 mg Oral BID   ??? cloNIDine HCl (CATAPRES) tablet 0.1 mg  0.1 mg Oral DAILY   ??? 0.9% sodium chloride infusion  75 mL/hr IntraVENous CONTINUOUS   ??? ondansetron (ZOFRAN ODT) tablet 4 mg  4 mg Oral Q4H PRN   ??? insulin lispro (HUMALOG) injection   SubCUTAneous AC&HS   ??? glucose chewable tablet 16 g  4 Tab Oral PRN   ??? glucagon (GLUCAGEN) injection 1 mg  1 mg IntraMUSCular PRN   ??? dextrose (D50W) injection syrg 12.5-25 g  25-50 mL IntraVENous PRN   ??? ferrous sulfate tablet 325 mg  325 mg Oral TID WITH MEALS   ??? lisinopril-hydroCHLOROthiazide (PRINZIDE, ZESTORETIC) 20-12.5 mg per tablet 2 Tab  2 Tab Oral DAILY   ??? metoprolol succinate (TOPROL-XL) tablet 100 mg  100 mg Oral DAILY   ??? amLODIPine (NORVASC) tablet 10 mg  10 mg Oral DAILY   ??? heparin (porcine) injection 5,000 Units  5,000 Units SubCUTAneous Q8H   ??? piperacillin-tazobactam (ZOSYN) 2.25 g in 0.9% sodium chloride (MBP/ADV) 50 mL MBP  2.25 g IntraVENous Q6H         Physical Exam:     Visit Vitals   ??? BP 154/65   ??? Pulse 77   ??? Temp 96.9 ??F (36.1 ??C)   ??? Resp 18   ??? Ht 5' (1.524 m)   ??? Wt 39.9 kg (88 lb)   ??? SpO2 99%   ??? BMI 17.19 kg/m2     BP Readings from Last 3 Encounters:   11/23/15 154/65     Pulse Readings from Last 3 Encounters:   11/23/15 77     Wt Readings from Last 3 Encounters:   11/22/15 39.9 kg (88 lb)       General:  alert, cooperative, no distress, appears stated age  Neck:  no JVD  Lungs:  clear to auscultation bilaterally  Heart:  regular rate and rhythm 2/6 systolic murmur LSB  Abdomen:  abdomen is soft without significant tenderness, masses, organomegaly or guarding  Extremities:  extremities normal, atraumatic, no cyanosis or edema  Skin: Warm and dry. no hyperpigmentation, vitiligo, or suspicious lesions   Neuro: alert, oriented x3, affect appropriate  Psych: non focal     Data Review:     Recent Labs      11/23/15   0309  11/22/15   0224  11/21/15   0935   WBC  9.2  10.4  9.3   HGB  9.9*  9.3*  10.7*   HCT  31.4*  29.1*  33.5*   PLT  183  218  219  Recent Labs      11/23/15   0309  11/22/15   1715  11/22/15   0224  11/21/15   0935   NA  142   --   142  137   K  3.9  4.2  2.7*  3.4*   CL  106   --   103  98*   CO2  27   --   28  29   GLU  100*   --   93  199*   BUN  9   --   13  22*   CREA  0.57*   --   0.75  0.92   CA  8.8   --   8.7  9.4   MG  1.5*   --   1.8  1.5*   ALB   --    --    --   4.4   SGOT   --    --    --   15   ALT   --    --    --   22       Results for orders placed or performed during the hospital encounter of 11/21/15   EKG, 12 LEAD, INITIAL   Result Value Ref Range    Ventricular Rate 94 BPM    Atrial Rate 94 BPM    P-R Interval 118 ms    QRS Duration 72 ms    Q-T Interval 350 ms    QTC Calculation (Bezet) 437 ms    Calculated P Axis 59 degrees    Calculated R Axis 32 degrees    Calculated T Axis -128 degrees    Diagnosis       Normal sinus rhythm  ST & T wave abnormality, consider inferolateral ischemia  Abnormal ECG  When compared with ECG of 21-Nov-2015 13:22,  premature atrial complexes are no longer present  Confirmed by Doristine Johns, MD, Ryan ([id number]) on 11/22/2015 10:46:12 AM         All Cardiac Markers in the last 24 hours:  No results found for: CPK, CKMMB, CKMB, RCK3, CKMBT, CKNDX, CKND1, MYO, TROPT, TROIQ, TROI, TROPT, TNIPOC, BNP, BNPP    Last Lipid:  No results found for: CHOL, CHOLX, CHLST, CHOLV, HDL, LDL, LDLC, DLDLP, TGLX, TRIGL, TRIGP, CHHD, CHHDX    Signed By: Trina Ao, DO     November 23, 2015

## 2015-11-23 NOTE — Progress Notes (Signed)
The following addresses CDMP query regarding protein malnutrition:    Patient is underweight (BMI 17) with normal albumin (4.4), likely secondary to her current disease state. Will recommend follow up as outpatient.

## 2015-11-23 NOTE — Med Student Progress Note (Signed)
*  ATTENTION:  This note has been created by a medical student for educational purposes only.  Please do not refer to the content of this note for clinical decision-making, billing, or other purposes.  Please see attending physician???s note to obtain clinical information on this patient.*       S: The patient is sitting comfortably in her bed and is in soft restraints. She does not have any complaints or pain. She denies chest pain, shortness of breath, palpitations, abdominal pain, and dysuria.    B: The patient was admitted due to sepsis secondary to UTI and chest pain with an EKG showing T wave inversion in the inferolateral leads. She was switched from zosyn to levofloxacin earlier today. Last night the patient had sundowning and woke up calling for her daughter and trying to pull out her IV.   A:  Physical exam  Constitutional: She appears well-developed.  Cardiovascular: Regular rate and rhythm. Exam reveals no gallop and no friction rub. Systolic murmur.  Abdominal: Soft. Bowel sounds are normal. She exhibits no distension. There is no tenderness.   Neurologic: Patient is oriented to self. She is not oriented to place or time (year or season). She believes she is at home. CN II-XII intact. Strength 5/5 in upper and lower extremities. Patellar reflexes difficult to assess given patient's difficulty responding to instructions. Brachioradialis reflexes 2+ bilaterally. No pronator drift. Negative Romberg.   R: Sepsis secondary to UTI: Continue to treat with levofloxacin.   Chest pain: No pain is reported at this time. Continue to treat for rehydration and with betablocker therapy as recommended by cardiology.     Myra GianottiEmma Rudebusch, MS3

## 2015-11-23 NOTE — Other (Signed)
Pt managed to get out a 2nd IV since being on the unit; pt was placed in soft wrist restraints bilaterally; pt managed to get the IV out anyway. MD called and made aware of pt status regarding pt continuous IV removals; MD stated to just leave the IV out for now and discontinue pt continuous fluids.

## 2015-11-23 NOTE — Med Student Progress Note (Signed)
*ATTENTION:  This note has been created by a medical student for educational purposes only.  Please do not refer to the content of this note for clinical decision-making, billing, or other purposes.  Please see attending physician???s note to obtain clinical information on this patient.*         Intern Progress Note  [facility]       [name] MRN: [id number]  CSN: [id number]    Date of Birth: 1922-01-20  Age: 82 y.o.  Sex: female    DOA: 11/21/2015 LOS:  LOS: 2 days                    Subjective:       The patient denies any new complaints. She has not had any recent chest pain, palpitations, or nausea. She is in soft restraints because last night she repeatedly pulled out her IV. The patient's daughter reports that the patient seems much less talkative and more confused today compared with yesterday morning.     Review of Systems   Constitutional: Negative for chills and fever.   Respiratory: Negative for shortness of breath.    Cardiovascular: Negative for chest pain and palpitations.   Gastrointestinal: Negative for abdominal pain, heartburn and nausea.   Genitourinary: Negative for dysuria, frequency and urgency.   Neurological: Negative for dizziness and headaches.       Objective:      Patient Vitals for the past 24 hrs:   Temp Pulse Resp BP SpO2   11/23/15 0400 98.6 ??F (37 ??C) 78 18 152/66 100 %   11/22/15 1948 98.7 ??F (37.1 ??C) 83 18 174/65 100 %   11/22/15 1753 98.7 ??F (37.1 ??C) 86 18 (!) 133/101 98 %   11/22/15 1119 98.8 ??F (37.1 ??C) 99 20 149/69 99 %         Intake/Output Summary (Last 24 hours) at 11/23/15 0821  Last data filed at 11/22/15 2226   Gross per 24 hour   Intake             1820 ml   Output             1300 ml   Net              520 ml       Physical Exam   Cardiovascular: Normal rate and regular rhythm.    Murmur heard.   Systolic murmur is present   Pulses:       Radial pulses are 2+ on the right side        Dorsalis pedis pulses are 2+ on the right side    Murmur radiates to carotids   Pulmonary/Chest: Effort normal and breath sounds normal.   Abdominal: Soft. Bowel sounds are normal. She exhibits no distension. There is no tenderness.   Neurological: She is alert.   Oriented to person, but not to place or year. The patient believes she is at home.   Skin: Skin is warm and dry.         Scheduled Medications Reviewed:  Current Facility-Administered Medications   Medication Dose Route Frequency   ??? magnesium sulfate 2 g/50 ml IVPB (premix or compounded)  2 g IntraVENous ONCE   ??? Saccharomyces boulardii (FLORASTOR) capsule 250 mg  250 mg Oral BID   ??? cloNIDine HCl (CATAPRES) tablet 0.1 mg  0.1 mg Oral DAILY   ??? 0.9% sodium chloride infusion  75 mL/hr IntraVENous CONTINUOUS   ???  insulin lispro (HUMALOG) injection   SubCUTAneous AC&HS   ??? ferrous sulfate tablet 325 mg  325 mg Oral TID WITH MEALS   ??? lisinopril-hydroCHLOROthiazide (PRINZIDE, ZESTORETIC) 20-12.5 mg per tablet 2 Tab  2 Tab Oral DAILY   ??? metoprolol succinate (TOPROL-XL) tablet 100 mg  100 mg Oral DAILY   ??? amLODIPine (NORVASC) tablet 10 mg  10 mg Oral DAILY   ??? heparin (porcine) injection 5,000 Units  5,000 Units SubCUTAneous Q8H   ??? piperacillin-tazobactam (ZOSYN) 2.25 g in 0.9% sodium chloride (MBP/ADV) 50 mL MBP  2.25 g IntraVENous Q6H         Imaging, microbiology, and EKG/Telemetry:  Echocardiogram 11/22/15 4:40pm  A clinically significant myopathic process is ruled out. Mild aortic stenosis. Ejection fraction was estimated 65%-70%. Left ventricle showed no obvious wall motion abnormalities. Dynamic obstruction, with mid-cavity obliteration.      Assessment/Plan     1. Sepsis secondary to UTI: Urine culture showed greater than 100,000 colonies/mL of Klebsiella pneumoniae. Will switch from Zosyn to Levaquin.     2. Chest pain: EKGs have shown ST and T wave abnormalities compared to a normal EKG in April 2017. Patient's echo showed mild aortic stenosis and  dynamic obstruction with mid-cavity obliteration. Will consult cardiology for recommendation.    3. Nausea and vomiting: The patient's nausea/vomiting seem to be improving. She has been able to keep down the food she eats. Continue with Zofran as needed.    4. Hypomagnesia: Magnesium was down 1.5 from 1.8 yesterday. Once the patient's IV has been reestablished will replenish magnesium.       Diet: Diabetic  DVT Prophylaxis: Heparin  Code Status: Full code    Myra Gianottimma Rudebusch

## 2015-11-23 NOTE — Progress Notes (Signed)
[place] [facility] was paged. A return call by Dr. Hyacinth MeekerMiller was received. Order to renew wrists restrains was received.

## 2015-11-23 NOTE — Progress Notes (Addendum)
Care Management Interventions  PCP Verified by CM: Yes (DR. Harlon DittyBRAWLEY)  Palliative Care Consult (Criteria: CHF and RRAT>21): No  Reason for No Palliative Care Consult: Other (see comment) (NO ORDER)  Mode of Transport at Discharge: Other (see comment) (FAMILY WILL TRANSPORT THIS PT HOME)  Transition of Care Consult (CM Consult): Discharge Planning  Occupational Therapy Consult: Yes  Speech Therapy Consult: Yes  Current Support Network: Family [place], Relative's Home  Confirm Follow Up Transport: Family  Plan discussed with Pt/Family/Caregiver: Yes (PT LIVES WITH HER DAUGHTER [name] Juarez, WHO REPORTS THAT SHE IS AWAY FROM HOME THREE TO HOURS A DAY WITH NO INCIDENT AND NO APPARENT SAFETY ISSUES.)  Discharge Location  Discharge Placement: Home with family assistance  Pt is a 82 year old female in room. This pt was admitted due to  Pt is alert and oriented x 2.  This pt lives with her daughter, who states she is the only child.  Pt reports that pt has not had any problems at home, until this medical concern presented itself. Pt states she is an only child. daugther reports that she is not in favor of Home Health because this pt will not open the door for anyone when she is not there. This Clinical research associatewriter and M.D, will meet with family to discuss discharge planning and care plan around safety.   Pt's daughter wants pt to return home with no home health and will continue present plan of care prior to this admission.

## 2015-11-23 NOTE — Other (Deleted)
Please clarify if this patient is being treated/managed for:    => Moderate  or Mild Protein Calorie Malnutrion  =>Other Explanation of clinical findings  =>[name] Juarez (no explanation of clinical findings)    The medical record reflects the following:    Risk::82 y.o. female with PMH significant for type II DM, hypertension and mixed and sensorineural hearing loss now admitted for chest pain.  ??  Sepsis with a source; UTI 2/4 SIRS criteria patient received ceftriaxone in the ED. HR 90+, RR 12-24. Urinalysis with +LE, nitrates and 3+ bacteria.    Clinical Indicators:nauseous x3weeks,dx sepsis ,BMI 17    Treatment:- Add supplements: Glucerna Shake BID.   - Continue RD inpatient monitoring and evaluation.  ????  NUTRITION INTERVENTIONS & DIAGNOSIS:  ??   Meals/Snacks: modified diet   Medical food supplementation: initiate    ??  Nutrition Diagnosis: Inadequate oral intake related to decreased appetite as evidenced by pt with variable meal intake, consuming 50% or less of meals.   ??     Please clarify and document your clinical opinion in the progress notes and discharge summary including the definitive and/or presumptive diagnosis, (suspected or probable), related to the above clinical findings. Please include clinical findings supporting your diagnosis.    If you DECLINE this query or would like to communicate with CDMP, please utilize the "CDMP message box" at the TOP of the Progress Note on the right.      Thank you,CDMP

## 2015-11-23 NOTE — Other (Deleted)
Please clarify if this patient is being treated/managed for:    => Metabolic Encephalopathy   =>Other Explanation of clinical findings  =>Unable to Determine (no explanation of clinical findings)    The medical record reflects the following:    Risk:82yo female with PMH hypertension, type 2 DM B/L mixed and sensorineural hearing loss, now presenting with nausea and vomitting. The patient was interviewed with her daughter. They note over the past three weeks the patient has become nauseous and vomited after meals, her symptoms improved last week but returned by Sunday.     Clinical Indicators:pulling out lines and tubes,MD documenting delirium ,soft wrist restraints placed on,abnormal lab values noted      Treatment: restraints ordered,labs monitored     Please clarify and document your clinical opinion in the progress notes and discharge summary including the definitive and/or presumptive diagnosis, (suspected or probable), related to the above clinical findings. Please include clinical findings supporting your diagnosis.    If you DECLINE this query or would like to communicate with CDMP, please utilize the "CDMP message box" at the TOP of the Progress Note on the right.      Thank you,CDMP

## 2015-11-23 NOTE — Progress Notes (Signed)
Bedside and Verbal shift change report given to Fayrene Fearing, Charity fundraiser (oncoming nurse) by Lorenza Cambridge, RN (offgoing nurse). Report included the following information SBAR, Kardex, MAR and Recent Results.  ??  SITUATION:  Code Status: Full Code  Reason for Admission: Chest pain  Hypomagnesemia  Weakness  Chest pain  Hospital day: 3  Problem List:   ????  Hospital Problems  Never Reviewed    ???? ???? ???? Codes Class Noted POA   ?? Hypomagnesemia ICD-10-CM: L24.40  ICD-9-CM: 275.2 ?? 11/21/2015 Unknown   ?? ??   ?? Chest pain ICD-10-CM: R07.9  ICD-9-CM: 786.50 ?? 11/21/2015 Unknown   ?? ??   ?? Weakness ICD-10-CM: R53.1  ICD-9-CM: 780.79 ?? 11/21/2015 Unknown   ?? ??   ?? * (Principal)Sepsis (HCC) ICD-10-CM: A41.9  ICD-9-CM: 038.9, 995.91 ?? 11/21/2015 Yes   ?? ??      ??  ??  BACKGROUND:                        Past Medical History:        Past Medical History:   Diagnosis Date   ??? Arthritis ??   ??? Diabetes (HCC) ??   ??? Hypertension ??   ??                        Patient taking anticoagulants yes                         Patient has a defibrillator: no                         History of shots NO for example, flu, pneumonia, tetanus                        Isolation History NO for example, MRSA, CDiff  ??  ASSESSMENT:  Changes in Assessment Throughout Shift: NONE  Significant Changes in 24 hours (for example, RR/code, fall)  Patient has Central Line: no   Patient has Foley Cath: no   Mobility Issues  PT  IV Patency  OR Checklist  Pending Tests  ??  Last Vitals:  Vitals w/ MEWS Score (last day)     Date/Time MEWS Score Pulse Resp Temp BP Level of Consciousness SpO2   ?? 11/24/15 0430 1 97 18 97.7 ??F (36.5 ??C) 158/67 Alert 98 %   ?? 11/24/15 0036 1 90 18 97.6 ??F (36.4 ??C) 153/73 Alert 100 %   ?? 11/23/15 2035 1 90 18 97.2 ??F (36.2 ??C) 160/67 Alert 91 %   ?? 11/23/15 1648 1 100 17 98.6 ??F (37 ??C) 156/76 Alert 100 %   ?? 11/23/15 1249 -- 81 18 -- 152/61 -- 100 %   ?? 11/23/15 1238 1 81 18 97.2 ??F (36.2 ??C) 152/61 Alert 100 %   ?? 11/23/15 0857 -- 77 18 -- 154/65 -- 99 %    ?? 11/23/15 0822 1 77 18 96.9 ??F (36.1 ??C) 154/65 Alert 99 %   ?? 11/23/15 0400 1 78 18 98.6 ??F (37 ??C) 152/66 Alert 100 %   ?? ??      ??  PAIN  Pain Assessment                                              Pain Intensity 1: 0 (11/24/15 0430)                                                                                                                                            Patient Stated Pain Goal: 0  Intervention effective: yes  Time of last intervention: No c/o pain Reassessment Completed: yes   Other actions taken for pain: Distraction  ??  Last 3 Weights:       Last 3 Recorded Weights in this Encounter   ?? 11/21/15 0940 11/22/15 0422   Weight: 38.1 kg (84 lb) 39.9 kg (88 lb)   Weight change:   ??  INTAKE/OUPUT                                              Current Shift:                                                Last three shifts: 07/19 1901 - 07/21 0700  In: 360 [P.O.:360]  Out: 1950 [Urine:1950]  ??  RECOMMENDATIONS AND DISCHARGE PLANNING  Patient needs and requests: safety and comfort  ??  Pending tests/procedures: labs   ??  Discharge plan for patient: TBD  ??  Discharge planning Needs or Barriers:NONE  ??  Estimated Discharge Date: TBD Posted on Whiteboard in Patient???s Room: yes    ????  "HEALS" SAFETY CHECK  A safety check occurred in the patient's room between off going nurse and oncoming nurse listed above.  ??  The safety check included the below items:  ??  H  High Alert Medications                      Verify all high alert medication drips (heparin, PCA, etc.)  E  Equipment                 Suction is set up for ALL patients (with yanker)  Red plugs utilized for all equipment (IV pumps, etc.)  WOW???s wiped down at end of shift.  Room stocked with oxygen, suction, and other unit-specific supplies  A  Alarms           Bed alarm is set for fall risk patients  Ensure chair alarm is in place and activated if patient is up in a chair  L  Lines              Check IV for any infiltration  Foley bag is empty if patient has a Foley   Tubing and IV bags are labeled  S  Safety  Room is clean, patient is clean, and equipment is clean.  Hallways are clear from equipment besides carts.   Fall bracelet on for fall risk patients  Ensure room is clear and free of clutter  Suction is set up for ALL patients (with yanker)  Hallways are clear from equipment besides carts.   Isolation precautions followed, supplies available outside room, sign posted  ??

## 2015-11-23 NOTE — Progress Notes (Addendum)
Intern Progress Note  [facility]       [name] MRN: [id number]  CSN: [id number]    Date of Birth: 1922/03/28  Age: 82 y.o.  Sex: female    DOA: 11/21/2015 LOS:  LOS: 2 days                    Subjective:     Acute events: The patient was very confused last night, attempted to get out of the bed, she pulled out her IVs multiple times. She was put in soft wrist restraints.     Mrs. Bobeck was sitting up and eating this morning. She endorses feeling better. She was oriented to self, recognized she was not at home but did not know where and when asked who the President is, reported 'Reagan'. Nursing notes that Mrs. Byers ambulates without difficulty and will re-attempt inserting the IV.    Review of Systems   Constitutional: Negative for chills and fever.   Eyes: Negative for blurred vision and double vision.   Respiratory: Negative for cough, shortness of breath and wheezing.    Cardiovascular: Negative for chest pain and palpitations.   Gastrointestinal: Negative for constipation, diarrhea, nausea and vomiting.   Genitourinary: Negative for dysuria, frequency and urgency.   Neurological: Negative for dizziness and headaches.       Objective:      Patient Vitals for the past 24 hrs:   Temp Pulse Resp BP SpO2   11/23/15 0400 98.6 ??F (37 ??C) 78 18 152/66 100 %   11/22/15 1948 98.7 ??F (37.1 ??C) 83 18 174/65 100 %   11/22/15 1753 98.7 ??F (37.1 ??C) 86 18 (!) 133/101 98 %   11/22/15 1119 98.8 ??F (37.1 ??C) 99 20 149/69 99 %         Intake/Output Summary (Last 24 hours) at 11/23/15 0743  Last data filed at 11/22/15 2226   Gross per 24 hour   Intake             1820 ml   Output             1300 ml   Net              520 ml       Physical Exam   Constitutional: She appears well-developed.   HENT:   Head: Normocephalic.   Cardiovascular: Normal rate and regular rhythm.  Exam reveals no gallop and no friction rub.    Systolic murmur    Pulmonary/Chest: Effort normal and breath sounds normal. No respiratory distress. She has no wheezes. She has no rales.   Abdominal: Soft. Bowel sounds are normal. She exhibits no distension. There is no tenderness.   Musculoskeletal: She exhibits no edema.   Skin: Skin is warm.   Psychiatric:   Some confusion. A&O 1/3 (to self only)       Lab/Data Reviewed:  BMP:   Lab Results   Component Value Date/Time    NA 142 11/23/2015 03:09 AM    K 3.9 11/23/2015 03:09 AM    CL 106 11/23/2015 03:09 AM    CO2 27 11/23/2015 03:09 AM    AGAP 9 11/23/2015 03:09 AM    GLU 100 (H) 11/23/2015 03:09 AM    BUN 9 11/23/2015 03:09 AM    CREA 0.57 (L) 11/23/2015 03:09 AM    GFRAA >60 11/23/2015 03:09 AM    GFRNA >60 11/23/2015 03:09 AM     CBC:  Lab Results   Component Value Date/Time    WBC 9.2 11/23/2015 03:09 AM    HGB 9.9 (L) 11/23/2015 03:09 AM    HCT 31.4 (L) 11/23/2015 03:09 AM    PLT 183 11/23/2015 03:09 AM        Scheduled Medications Reviewed:  Current Facility-Administered Medications   Medication Dose Route Frequency   ??? magnesium sulfate 2 g/50 ml IVPB (premix or compounded)  2 g IntraVENous ONCE   ??? Saccharomyces boulardii (FLORASTOR) capsule 250 mg  250 mg Oral BID   ??? cloNIDine HCl (CATAPRES) tablet 0.1 mg  0.1 mg Oral DAILY   ??? 0.9% sodium chloride infusion  75 mL/hr IntraVENous CONTINUOUS   ??? insulin lispro (HUMALOG) injection   SubCUTAneous AC&HS   ??? ferrous sulfate tablet 325 mg  325 mg Oral TID WITH MEALS   ??? lisinopril-hydroCHLOROthiazide (PRINZIDE, ZESTORETIC) 20-12.5 mg per tablet 2 Tab  2 Tab Oral DAILY   ??? metoprolol succinate (TOPROL-XL) tablet 100 mg  100 mg Oral DAILY   ??? amLODIPine (NORVASC) tablet 10 mg  10 mg Oral DAILY   ??? heparin (porcine) injection 5,000 Units  5,000 Units SubCUTAneous Q8H   ??? piperacillin-tazobactam (ZOSYN) 2.25 g in 0.9% sodium chloride (MBP/ADV) 50 mL MBP  2.25 g IntraVENous Q6H         Imaging, microbiology, and EKG/Telemetry:    Echo  IMPRESSIONS:   A clinically significant myopathic process is ruled out.    SUMMARY:  Left ventricle: Systolic function was hyperdynamic. Ejection fraction was  estimated in the range of 65 % to 70 %. No obvious wall motion  abnormalities identified in the views obtained. There was dynamic  obstruction, with mid-cavity obliteration.    Right ventricle: Systolic pressure was mildly increased. Estimated peak  pressure was 46 mmHg.    Left atrium: The atrium was mildly dilated.    Aortic valve: The valve was trileaflet. Leaflets exhibited mildly  increased thickness, mild calcification, and sclerosis. There was mild  stenosis. Valve mean gradient was 18 mmHg.    Assessment/Plan     82 y.o.??female??with PMH significant for type II DM, hypertension and mixed and sensorineural hearing loss??now admitted for chest pain.  ????  Sepsis with a source; UTI 2/4 SIRS criteria??patient received ceftriaxone in the ED. HR 90+, RR 12-24. WBC on admission 9.3 now 10.4. Left shift with neutrophils 82 on admission. Urinalysis with +LE, nitrates and 3+ bacteria. Lactic acid was elevated at 2.3.  - Blood culture negative (note that this was drawn after administration of ceftriaxone)  - Monitor I/O carefully  - Urinalysis with Klebsiella pneumoniae. Resistant only to ampicillin. Will discontinue zosyn and start levofloxacin 250 QD for 7 more days.  - Probiotics for medication induced diarrhea prophylaxis  - WBC 9.3>10.4>9.2;  Neutrophils 82>74>83; Lactic Acid 2.3>2.3>1.1   - The patient became confused last night and pulled out her IVs several times. Nursing will attempt to restart IV.   - Will re-evaluate need for soft restraints.   ??  Metabolic encephalopathy The patient normally performs all of her own ADLS, IADLS. When the patient presented with some recent memory lapses noted by her daughter along with confusion as an inpatient. Likely 2/2 to above.     Chest pain??patient reported episodes of chest pressure that resolve with  tramadol. EKG in the ED had T wave inversion concerning for ischemia in the inferolateral leads. EKG 08/2015 IS NOT indicative of T wave inversion in inferolateral leads Troponin 0.02 in the ED,  0.05>0.07>0.6 on repeat. No pain reported at this time   - Pro-BNP 828  - EKG from 7/19 is unchanged from admission EKG.  ??- K 3.9 this morning.   - Magnesium 1.5, will replete with 2g   - Echo with dynamic obliteration of mid cavity, EF 65%  - Cardiology consulted to rule out clinically significant heart disease in the setting of echo findings and EKG changes on admission  ????  Nausea/vomitting??patient reports nausea and vomiting over the last three weeks  - Zofran 4 mg Q4H PRN  ????  Controlled DM II??Last A1C 6.6. Patient is taking pioglitizone-metformin 15-850 BID at home. States her blood sugars stay below 140.   - SSI  - ACHS glucose checks  - Diabetic diet  ????  Hypertension??Range of 128/52-173/68 at admission. Taking metoprolol 100 mg every day, amlodipine-atorvastatin 10-40 mg every day, lisinopril-HCTZ 20-12.5, 2 tablets every day,??clonidine 0.1 mg BID.   - Continue metoprolol 100 mg every day  - Continue amlodipine 10 mg every day  - Continue lisinopril-HCTZ 20-12.5, 2 tablets every day   - Hold atorvastatin 40 mg every day  - Continue clonidine at 0.1 per day and tapering off clonidine after 3-7 days.   ????  Hearing loss??evaluated by [facility] audiology 02/2015, and was found to??have moderately-severe to profound mixed hearing loss in the right ear and a moderate sloping to severe sensorineural hearing loss in the left ear.   - Will give the patient information to follow up  ????  Microcytic anemia??Hb 9.6 on last outpatient labs 11/14/2015. Hb 10.7, MCV 76.3 on admission.   - Continue home ferrous sulfate delayed release TID  - Hemoccult negative  - Hb 9.3 > 9.9   ????  Diet: Diabetic  DVT Prophylaxis: Heparin   Code Status: DNR  Point of Contact: Wilhelmena Zea [phone]  ????  Disposition and anticipated LOS: 2+ midnights  ????   Westley Hummer, MD??  PGY-1??[facility] Resident  [employer]

## 2015-11-23 NOTE — Consults (Signed)
[facility] - Consult Note    Consultation request by Lurlean LeydenBruce Britton, MD for advice/opinion related to evaluating Chest pain;Hypomagnesemia;Weakness;Chest pain    Date of  Admission: 11/21/2015  9:19 AM   Primary Care Physician:  Pershing ProudStephen Brawley, MD     The patient was seen, examined, and independently evaluated and I agree with the below assessment and plan by Eliot FordAmy  Barco, PA-C with the following comments. This lady's EKG which was abnormal on admission and remains mildly abnormal is most consistent with the hypokalemia which she had on admission and her troponin's are in low intermediate range and flat, and her echocardiogram shows completely normal LV function without wall motion abnormalities, so I would simply continue to treat for dehydration and her infectious process as well as betablocker therapy with possible addition of aspirin with further recommendations pending course.       Assessment:     Patient Active Problem List   Diagnosis Code   ??? Hypomagnesemia E83.42   ??? Chest pain R07.9   ??? Weakness R53.1   ??? Sepsis (HCC) A41.9       -Presented with nausea and vomiting with progressive weight loss.  -Encephalopathy with agitation, currently in restraints.  -Sepsis from urinary tract source, improving with abx and hydration.  -Abnormal echocardiogram. Hyperdynamic LV function EF 65-70% with dynamic LV midcavity obliteration. Likely related to patients volume depletion.  -Chest pain. Troponin indeterminate and not consistent with ACS. ECG however abnormal with inferolateral T wave abnormalities which are reportably new however no tracings available for direct review.  -Anemia stable overall.  -Electrolyte imbalance with hypokalemia and hypomagnesemia being replaced.  -Diabetes mellitus.  -Hypertension.  -Hearing loss.     Plan:     Would avoid dehydration and maximize BB therapy. Would continue hydration with electrolyte replacement as needed. Would encourage oral intake.  Despite abnormal ECG,  patient ruled out for MI and echocardiogram does not reveal WMA. In setting of advanced age with confusion and agitation, would attempt conservative therapy from cardiac standpoint unless developing unstable cardiac symptoms. Would recommend ASA if without bleeding concerns. Would check lipid panel and start statin if indicated. Would continue BB.  Would continue treatment of underlying infectious and GI processes.     History of Present Illness:     This is a 82 y.o. female admitted for Chest pain;Hypomagnesemia;Weakness;Chest pain.      Patient complains of:  N/V. Patient is a 82 year old female who presented to ER with persistent N/V with weight loss over the previous few weeks. Patient was treated with antiemetics and treated for urosepsis and dehydration. Cardiology is asked to comment on abnormal echocardiogram and chest pain. Patient is confused about why she is here but is easily reoriented. She does report nausea and vomiting. She also reports chest burning with and possibly before vomiting. She denies SOB, orthopnea, PND, LEE, palp, syncope. She denies previous cardiac work-up.      Cardiac risk factors:   Advanced age  HTN  Diabetes mellitus.    Review of Symptoms:    Difficult to obtain reliable 10 point ROS due to confusion.     Past Medical History:     Past Medical History:   Diagnosis Date   ??? Arthritis    ??? Diabetes (HCC)    ??? Hypertension          Social History:     Social History     Social History   ??? Marital status: SINGLE  Spouse name: N/A   ??? Number of children: N/A   ??? Years of education: N/A     Social History Main Topics   ??? Smoking status: Never Smoker   ??? Smokeless tobacco: Never Used   ??? Alcohol use No   ??? Drug use: None   ??? Sexual activity: Not Currently     Other Topics Concern   ??? None     Social History Narrative   ??? None        Family History:   History reviewed. No pertinent family history.     Medications:   No Known Allergies     Current Facility-Administered Medications    Medication Dose Route Frequency   ??? magnesium sulfate 2 g/50 ml IVPB (premix or compounded)  2 g IntraVENous ONCE   ??? Saccharomyces boulardii (FLORASTOR) capsule 250 mg  250 mg Oral BID   ??? cloNIDine HCl (CATAPRES) tablet 0.1 mg  0.1 mg Oral DAILY   ??? 0.9% sodium chloride infusion  75 mL/hr IntraVENous CONTINUOUS   ??? ondansetron (ZOFRAN ODT) tablet 4 mg  4 mg Oral Q4H PRN   ??? insulin lispro (HUMALOG) injection   SubCUTAneous AC&HS   ??? glucose chewable tablet 16 g  4 Tab Oral PRN   ??? glucagon (GLUCAGEN) injection 1 mg  1 mg IntraMUSCular PRN   ??? dextrose (D50W) injection syrg 12.5-25 g  25-50 mL IntraVENous PRN   ??? ferrous sulfate tablet 325 mg  325 mg Oral TID WITH MEALS   ??? lisinopril-hydroCHLOROthiazide (PRINZIDE, ZESTORETIC) 20-12.5 mg per tablet 2 Tab  2 Tab Oral DAILY   ??? metoprolol succinate (TOPROL-XL) tablet 100 mg  100 mg Oral DAILY   ??? amLODIPine (NORVASC) tablet 10 mg  10 mg Oral DAILY   ??? heparin (porcine) injection 5,000 Units  5,000 Units SubCUTAneous Q8H   ??? piperacillin-tazobactam (ZOSYN) 2.25 g in 0.9% sodium chloride (MBP/ADV) 50 mL MBP  2.25 g IntraVENous Q6H         Physical Exam:     Visit Vitals   ??? BP 154/65   ??? Pulse 77   ??? Temp 96.9 ??F (36.1 ??C)   ??? Resp 18   ??? Ht 5' (1.524 m)   ??? Wt 39.9 kg (88 lb)   ??? SpO2 99%   ??? BMI 17.19 kg/m2     BP Readings from Last 3 Encounters:   11/23/15 154/65     Pulse Readings from Last 3 Encounters:   11/23/15 77     Wt Readings from Last 3 Encounters:   11/22/15 39.9 kg (88 lb)       General:  alert, cooperative, no distress, appears stated age  Neck:  no JVD  Lungs:  clear to auscultation bilaterally  Heart:  regular rate and rhythm 2/6 systolic murmur LSB  Abdomen:  abdomen is soft without significant tenderness, masses, organomegaly or guarding  Extremities:  extremities normal, atraumatic, no cyanosis or edema  Skin: Warm and dry. no hyperpigmentation, vitiligo, or suspicious lesions  Neuro: alert, oriented x3, affect appropriate  Psych: non focal      Data Review:     Recent Labs      11/23/15   0309  11/22/15   0224  11/21/15   0935   WBC  9.2  10.4  9.3   HGB  9.9*  9.3*  10.7*   HCT  31.4*  29.1*  33.5*   PLT  183  218  219  Recent Labs      11/23/15   0309  11/22/15   1715  11/22/15   0224  11/21/15   0935   NA  142   --   142  137   K  3.9  4.2  2.7*  3.4*   CL  106   --   103  98*   CO2  27   --   28  29   GLU  100*   --   93  199*   BUN  9   --   13  22*   CREA  0.57*   --   0.75  0.92   CA  8.8   --   8.7  9.4   MG  1.5*   --   1.8  1.5*   ALB   --    --    --   4.4   SGOT   --    --    --   15   ALT   --    --    --   22       Results for orders placed or performed during the hospital encounter of 11/21/15   EKG, 12 LEAD, INITIAL   Result Value Ref Range    Ventricular Rate 94 BPM    Atrial Rate 94 BPM    P-R Interval 118 ms    QRS Duration 72 ms    Q-T Interval 350 ms    QTC Calculation (Bezet) 437 ms    Calculated P Axis 59 degrees    Calculated R Axis 32 degrees    Calculated T Axis -128 degrees    Diagnosis       Normal sinus rhythm  ST & T wave abnormality, consider inferolateral ischemia  Abnormal ECG  When compared with ECG of 21-Nov-2015 13:22,  premature atrial complexes are no longer present  Confirmed by Doristine Johns, MD, Ryan ([id number]) on 11/22/2015 10:46:12 AM         All Cardiac Markers in the last 24 hours:  No results found for: CPK, CKMMB, CKMB, RCK3, CKMBT, CKNDX, CKND1, MYO, TROPT, TROIQ, TROI, TROPT, TNIPOC, BNP, BNPP    Last Lipid:  No results found for: CHOL, CHOLX, CHLST, CHOLV, HDL, LDL, LDLC, DLDLP, TGLX, TRIGL, TRIGP, CHHD, CHHDX    Signed By: Trina Ao, DO     November 23, 2015

## 2015-11-24 LAB — CBC WITH AUTOMATED DIFF
ABS. BASOPHILS: 0 10*3/uL (ref 0.0–0.1)
ABS. EOSINOPHILS: 0.1 10*3/uL (ref 0.0–0.4)
ABS. LYMPHOCYTES: 1.3 10*3/uL (ref 0.9–3.6)
ABS. MONOCYTES: 0.9 10*3/uL (ref 0.05–1.2)
ABS. NEUTROPHILS: 4.4 10*3/uL (ref 1.8–8.0)
BASOPHILS: 0 % (ref 0–2)
EOSINOPHILS: 1 % (ref 0–5)
HCT: 27 % — ABNORMAL LOW (ref 35.0–45.0)
HGB: 8.7 g/dL — ABNORMAL LOW (ref 12.0–16.0)
LYMPHOCYTES: 20 % — ABNORMAL LOW (ref 21–52)
MCH: 24.2 PG (ref 24.0–34.0)
MCHC: 32.2 g/dL (ref 31.0–37.0)
MCV: 75.2 FL (ref 74.0–97.0)
MONOCYTES: 13 % — ABNORMAL HIGH (ref 3–10)
MPV: 10.2 FL (ref 9.2–11.8)
NEUTROPHILS: 66 % (ref 40–73)
PLATELET: 214 10*3/uL (ref 135–420)
RBC: 3.59 M/uL — ABNORMAL LOW (ref 4.20–5.30)
RDW: 14.5 % (ref 11.6–14.5)
WBC: 6.6 10*3/uL (ref 4.6–13.2)

## 2015-11-24 LAB — LIPID PANEL
CHOL/HDL Ratio: 1.8 (ref 0–5.0)
Cholesterol, total: 180 MG/DL (ref <200)
HDL Cholesterol: 102 MG/DL — ABNORMAL HIGH (ref 40–60)
LDL, calculated: 65.6 MG/DL (ref 0–100)
Triglyceride: 62 MG/DL (ref <150)
VLDL, calculated: 12.4 MG/DL

## 2015-11-24 LAB — METABOLIC PANEL, BASIC
Anion gap: 9 mmol/L (ref 3.0–18)
BUN/Creatinine ratio: 13 (ref 12–20)
BUN: 7 MG/DL (ref 7.0–18)
CO2: 27 mmol/L (ref 21–32)
Calcium: 8.6 MG/DL (ref 8.5–10.1)
Chloride: 105 mmol/L (ref 100–108)
Creatinine: 0.56 MG/DL — ABNORMAL LOW (ref 0.6–1.3)
GFR est AA: >60 mL/min/{1.73_m2} (ref >60)
GFR est non-AA: >60 mL/min/{1.73_m2} (ref >60)
Glucose: 102 mg/dL — ABNORMAL HIGH (ref 74–99)
Potassium: 3.6 mmol/L (ref 3.5–5.5)
Sodium: 141 mmol/L (ref 136–145)

## 2015-11-24 LAB — GLUCOSE, POC
Glucose (POC): 194 mg/dL — ABNORMAL HIGH (ref 70–110)
Glucose (POC): 197 mg/dL — ABNORMAL HIGH (ref 70–110)
Glucose (POC): 224 mg/dL — ABNORMAL HIGH (ref 70–110)

## 2015-11-24 LAB — MAGNESIUM: Magnesium: 1.8 mg/dL (ref 1.6–2.6)

## 2015-11-24 MED ORDER — METFORMIN 850 MG TAB
850 mg | ORAL_TABLET | Freq: Two times a day (BID) | ORAL | 0 refills | Status: DC
Start: 2015-11-24 — End: 2015-12-08

## 2015-11-24 MED ORDER — AMLODIPINE 10 MG TAB
10 mg | ORAL_TABLET | Freq: Every day | ORAL | 0 refills | Status: DC
Start: 2015-11-24 — End: 2015-12-08

## 2015-11-24 MED ORDER — LEVOFLOXACIN 250 MG TAB
250 mg | ORAL_TABLET | ORAL | 0 refills | Status: AC
Start: 2015-11-24 — End: 2015-12-02

## 2015-11-24 MED ORDER — CLONIDINE 0.1 MG TAB
0.1 mg | ORAL_TABLET | Freq: Every day | ORAL | 0 refills | Status: AC
Start: 2015-11-24 — End: 2015-11-29

## 2015-11-24 MED FILL — INSULIN LISPRO 100 UNIT/ML INJECTION: 100 unit/mL | SUBCUTANEOUS | Qty: 1

## 2015-11-24 MED FILL — FERROUS SULFATE 325 MG (65 MG ELEMENTAL IRON) TAB: 325 mg (65 mg iron) | ORAL | Qty: 1

## 2015-11-24 MED FILL — HEPARIN (PORCINE) 5,000 UNIT/ML IJ SOLN: 5000 unit/mL | INTRAMUSCULAR | Qty: 1

## 2015-11-24 MED FILL — AMLODIPINE 10 MG TAB: 10 mg | ORAL | Qty: 1

## 2015-11-24 MED FILL — METOPROLOL SUCCINATE SR 50 MG 24 HR TAB: 50 mg | ORAL | Qty: 2

## 2015-11-24 MED FILL — CLONIDINE 0.1 MG TAB: 0.1 mg | ORAL | Qty: 1

## 2015-11-24 MED FILL — LEVOFLOXACIN 250 MG TAB: 250 mg | ORAL | Qty: 1

## 2015-11-24 MED FILL — PROBIOTIC (S.BOULARDII) 250 MG CAPSULE: 250 mg | ORAL | Qty: 1

## 2015-11-24 MED FILL — LISINOPRIL-HYDROCHLOROTHIAZIDE 20 MG-12.5 MG TAB: ORAL | Qty: 2

## 2015-11-24 NOTE — Discharge Summary (Signed)
Discharge Summary  [facility]      [name] Age: 82 y.o.  Sex: female  DOB: 09/03/1921    MRN: [id number]      DOA: 11/21/2015      Discharge Date: 11/22/2015      Attending:Bruce Toribio Harbour, MD      PCP: [name], MD        ================================================================    Reason for Admission:   Chest pain  Hypomagnesemia  Weakness  Chest pain  Sepsis 2/2 UTI  Hypokalemia    Discharge Diagnoses:   Sepsis 2/2 UTI (resolving)  Hypokalemia (resolved)  Metabolic encephalopathy 2/2 sepsis (resolving)  DM II (controlled)  Hypertension (controlled)  Hearing loss (pre-existing and ongoing)  Microcytic anemia (stable)    Important notes to PCP/ follow-up studies and evaluations   - Started tapering clonidine as inpatient. Was on 0.1 mg clonidine PO BID. Decreased the dose by 0.1 mg 11/22/2015, final dose of taper by 12/01/2015.  - Discontinued her pioglitizone due to concern for cardiovascular risk. Please follow up on her blood sugar control.   - Please consider doing a MOCA/Mini cog as outpatient. Memory loss and confusion as an inpatient.   - Please inquire as to ability to perform ADLs/IADLs  - Please follow up on diet. The patient has lost 18 pounds in the last four months, possibly almost all from the last month.     Pending labs and studies:  None  Operative Procedures:   None    Discharge Medications:     Current Discharge Medication List      START taking these medications    Details   amLODIPine (NORVASC) 10 mg tablet Take 1 Tab by mouth daily for 30 days.  Qty: 30 Tab, Refills: 0      levoFLOXacin (LEVAQUIN) 250 mg tablet Take 1 Tab by mouth every twenty-four (24) hours for 8 days.  Qty: 8 Tab, Refills: 0      metFORMIN (GLUCOPHAGE) 850 mg tablet Take 1 Tab by mouth two (2) times a day for 30 days.  Qty: 60 Tab, Refills: 0         CONTINUE these medications which have CHANGED    Details    cloNIDine HCl (CATAPRES) 0.1 mg tablet Take 1 Tab by mouth daily for 5 doses. Take one every day then starting Monday November 27 2015 please take this medication every other day until finished  Qty: 5 Tab, Refills: 0         CONTINUE these medications which have NOT CHANGED    Details   metoprolol succinate (TOPROL-XL) 100 mg tablet Take 100 mg by mouth daily.      lisinopril-hydroCHLOROthiazide (PRINZIDE, ZESTORETIC) 20-12.5 mg per tablet Take 2 Tabs by mouth daily.      ferrous sulfate (IRON) 325 mg (65 mg iron) EC tablet Take 325 mg by mouth three (3) times daily (with meals).      traMADol (ULTRAM) 50 mg tablet Take 50 mg by mouth every six (6) hours as needed for Pain.         STOP taking these medications       amLODIPine-atorvastatin (CADUET) 10-40 mg per tablet Comments:   Reason for Stopping:         pioglitazone-metFORMIN (ACTOPLUS MET) 15-850 mg per tablet Comments:   Reason for Stopping:                 Disposition: Home    Consultants:    Cardiology -  Dr. Jerral Bonito    [facility] Course (including pertinent history and physical findings)    [name] Juarez is a 82 year old female with a PMH significant for type II diabetes, hypertension and mixed/sensoneureal hearing loss who was admitted to the hospital for sepsis secondary to UTI.     Sepsis 2/2 UTI with 2/4 SIRS and Metabolic Encephalopathy UA on admission was suggestive of UTI with positive leukocyte esterase, positive nitrites and 3+ bacteria. Lactic acid was elevated at 2.3 and decreased to 1.1 during her admission. WBC 9.3 on admission, decreasing to 6.6 at discharge.The patient was treated with ceftriaxone in the ED and fluids. She received replacement fluids as an inpatient and zosyn. Urine culture was positive for Klebsiella Pneumoniae sensitive to levaquin. Levaquin 250 PO Q24H was prescribed for a total of 10 days. The patients daughter noted that in the past week her mother has had some memory issues, during her  hospitalization the patient became confused, often oriented only to person. This confusion is likely secondary to metabolic encephalopathy secondary to her illness. She is at a higher risk for confusion due to her severe hearing loss.     Chest pain The patient endorsed some pressure like chest pain on the day of admission 11/21/2015. Admission EKG indicative of new T wave inversion in inferolateral leads. Her admission troponin was 0.02 increasing to a maximum of 0.07 with no further chest pain. Inversion remained on EKG 11/22/2015. BNP 828, ECHO indicative of EF 65% with dynamic  obstruction (mid-cavity obliteration) of the left ventricle. Mild stenosis of the aortic valve. Cardiology was consulted for further management, who recommended aspirin and beta blocker therapy. Home beta blocker continued throughout hospitalization and home aspirin was continued on discharge.       Hypokalemia and Hypomagnesemia Potassium 3.4 and decreased to 2.7 during admission. The patient was given 80 meq total PO potassium. Potassium increased to 3.6 at discharge. Magnesium 1.5 on admission, the patient was given 2g IV. Magnesium increased to 1.8 at discharge.    Nausea/vomiting The patient came to the ED complaining of nausea and vomiting, she was not able to keep tolerate any food for the week prior to admission. Nausea and vomiting improved greatly over admission, and at discharge she was able to eat full meals without an anti-emetic.     Controlled DM 2 Home medications held. Blood sugars controlled with correctional insulin as an inpatient. Pioglitizone was discontinued due to concern for cardiovascular risk.     Hypertension/Hyperlipidemia Patients BP in the 150's/60's range. Clonidine taper was started as an inpatient due to concern that clonidine may worsen patient's memory loss. Statin therapy was discontinued as the patient noted weakness and frequent falls on admission.     Hearing loss The patient was evaluated by [facility] audiology 02/2015, and was found to??have moderately-severe to profound mixed hearing loss in the right ear and a moderate sloping to severe sensorineural hearing loss in the left ear. They were lost to follow up. I gave the patient information to follow up and stressed that poor hearing will predispose the patient to delirium.    Summarized key findings and results (labs, imaging studies, ECHO, cardiac cath, endoscopies, etc):  Left ventricle: Systolic function was hyperdynamic. Ejection fraction was  estimated in the range of 65 % to 70 %. No obvious wall motion  abnormalities identified in the views obtained. There was dynamic  obstruction, with mid-cavity obliteration.    Right ventricle: Systolic pressure was mildly increased. Estimated peak  pressure was 46 mmHg.    Left atrium: The atrium was mildly dilated.    Aortic valve: The valve was trileaflet. Leaflets exhibited mildly  increased thickness, mild calcification, and sclerosis. There was mild  stenosis. Valve mean gradient was 18 mmHg.    11/22/2015  Normal sinus rhythm   ST & T wave abnormality, consider inferolateral ischemia   Abnormal ECG   When compared with ECG of 21-Nov-2015 13:22,   premature atrial complexes are no longer present     11/21/2015  Sinus rhythm with premature atrial complexes   ST & T wave abnormality, consider inferolateral ischemia   Abnormal ECG   When compared with ECG of 21-Nov-2015 09:26,   T wave inversion less evident in Lateral leads     Functional status and cognitive function:    Ambulates with  Status: alert, cooperative, no distress, appears stated age    Diet: Recommend diabetic diet     Code status and advanced care plan: Full    Patient Education:  Patient was educated on the following topics prior to discharge: delirium, symptoms of UTI in geriatric patients, poor hearing in the elderly and risk for delirium    Follow-up:   Follow-up Information      Follow up With Details Comments Contact Info    [name], MD On 11/28/2015 @ 10:00 [address]  [place]  [place] [address]  [phone]              ================================================================  Nelida Meuse, MD

## 2015-11-24 NOTE — Other (Signed)
Chest Pain: Care Instructions  Your Care Instructions  There are many things that can cause chest pain. Some are not serious and will get better on their own in a few days. But some kinds of chest pain need more testing and treatment. Your doctor may have recommended a follow-up visit in the next 8 to 12 hours. If you are not getting better, you may need more tests or treatment.  Even though your doctor has released you, you still need to watch for any problems. The doctor carefully checked you, but sometimes problems can develop later. If you have new symptoms or if your symptoms do not get better, get medical care right away.  If you have worse or different chest pain or pressure that lasts more than 5 minutes or you passed out (lost consciousness), call 911 or seek other emergency help right away.   A medical visit is only one step in your treatment. Even if you feel better, you still need to do what your doctor recommends, such as going to all suggested follow-up appointments and taking medicines exactly as directed. This will help you recover and help prevent future problems.  How can you care for yourself at home?  ?? Rest until you feel better.  ?? Take your medicine exactly as prescribed. Call your doctor if you think you are having a problem with your medicine.  ?? Do not drive after taking a prescription pain medicine.  When should you call for help?  Call 911 if:  ?? You passed out (lost consciousness).  ?? You have severe difficulty breathing.  ?? You have symptoms of a heart attack. These may include:  ?? Chest pain or pressure, or a strange feeling in your chest.  ?? Sweating.  ?? Shortness of breath.  ?? Nausea or vomiting.  ?? Pain, pressure, or a strange feeling in your back, neck, jaw, or upper belly or in one or both shoulders or arms.  ?? Lightheadedness or sudden weakness.  ?? A fast or irregular heartbeat.  After you call 911, the operator may tell you to chew 1 adult-strength or  2 to 4 low-dose aspirin. Wait for an ambulance. Do not try to drive yourself.  Call your doctor today if:  ?? You have any trouble breathing.  ?? Your chest pain gets worse.  ?? You are dizzy or lightheaded, or you feel like you may faint.  ?? You are not getting better as expected.  ?? You are having new or different chest pain.  Where can you learn more?  Go to [employer].  Enter A120 in the search box to learn more about "Chest Pain: Care Instructions."  Current as of: July 24, 2015  Content Version: 11.3  ?? 2006-2017 [employer]. Care instructions adapted under license by [employer] (which disclaims liability or warranty for this information). If you have questions about a medical condition or this instruction, always ask your healthcare professional. [employer] disclaims any warranty or liability for your use of this information.         Sepsis: Care Instructions  Your Care Instructions  Sepsis is an infection that has spread throughout your body. It is a life-threatening condition and often causes extremely low blood pressure. This can lead to problems with many different organs.  The cause of sepsis is not always clear, but it can happen as part of a long-term or sudden illness. Sometimes even a mild illness can lead to sepsis.  Follow-up care is  a key part of your treatment and safety. Be sure to make and go to all appointments, and call your doctor if you are having problems. It???s also a good idea to know your test results and keep a list of the medicines you take.  How can you care for yourself at home?  ?? If your doctor prescribed antibiotics, take them as directed. Do not stop taking them just because you feel better. You need to take the full course of antibiotics.  ?? Drink plenty of fluids, enough so that your urine is light yellow or clear like water. Choose water or caffeine-free clear liquids until you  feel better. If you have kidney, heart, or liver disease and have to limit fluids, talk with your doctor before you increase your fluid intake. You can try rehydration drinks, such as Gatorade or Powerade.  ?? Do not drink alcohol.  ?? Eat a healthy diet. Include fruits, vegetables, and whole grains in your diet every day.  ?? Walking is an easy way to get exercise. Gradually increase the amount you walk every day. Make sure your doctor knows that you are starting an exercise program.  ?? Do not smoke or use other tobacco products. If you need help quitting, talk to your doctor about stop-smoking programs and medicines. These can increase your chances of quitting for good.  When should you call for help?  Call 911 anytime you think you may need emergency care. For example, call if:  ?? You passed out (lost consciousness).  Call your doctor now or seek immediate medical care if:  ?? You have a fever or chills.  ?? You have cool, pale, or clammy skin.  ?? You are dizzy or lightheaded, or you feel like you may faint.  ?? You have any new symptoms, such as a cough, pain in one part of your body, or urinary problems.  Watch closely for changes in your health, and be sure to contact your doctor if:  ?? You do not get better as expected.  Where can you learn more?  Go to [employer].  Enter [phone] in the search box to learn more about "Sepsis: Care Instructions."  Current as of: July 24, 2015  Content Version: 11.3  ?? 2006-2017 [employer]. Care instructions adapted under license by [employer] (which disclaims liability or warranty for this information). If you have questions about a medical condition or this instruction, always ask your healthcare professional. [employer] disclaims any warranty or liability for your use of this information.         Urinary Tract Infection in Women: Care Instructions  Your Care Instructions     A urinary tract infection, or UTI, is a general term for an infection anywhere between the kidneys and the urethra (where urine comes out). Most UTIs are bladder infections. They often cause pain or burning when you urinate.  UTIs are caused by bacteria and can be cured with antibiotics. Be sure to complete your treatment so that the infection goes away.  Follow-up care is a key part of your treatment and safety. Be sure to make and go to all appointments, and call your doctor if you are having problems. It's also a good idea to know your test results and keep a list of the medicines you take.  How can you care for yourself at home?  ?? Take your antibiotics as directed. Do not stop taking them just because you feel better. You need to take the  full course of antibiotics.  ?? Drink extra water and other fluids for the next day or two. This may help wash out the bacteria that are causing the infection. (If you have kidney, heart, or liver disease and have to limit fluids, talk with your doctor before you increase your fluid intake.)  ?? Avoid drinks that are carbonated or have caffeine. They can irritate the bladder.  ?? Urinate often. Try to empty your bladder each time.  ?? To relieve pain, take a hot bath or lay a heating pad set on low over your lower belly or genital area. Never go to sleep with a heating pad in place.  To prevent UTIs  ?? Drink plenty of water each day. This helps you urinate often, which clears bacteria from your system. (If you have kidney, heart, or liver disease and have to limit fluids, talk with your doctor before you increase your fluid intake.)  ?? Urinate when you need to.  ?? Urinate right after you have sex.  ?? Change sanitary pads often.  ?? Avoid douches, bubble baths, feminine hygiene sprays, and other feminine hygiene products that have deodorants.  ?? After going to the bathroom, wipe from front to back.  When should you call for help?   Call your doctor now or seek immediate medical care if:  ?? Symptoms such as fever, chills, nausea, or vomiting get worse or appear for the first time.  ?? You have new pain in your back just below your rib cage. This is called flank pain.  ?? There is new blood or pus in your urine.  ?? You have any problems with your antibiotic medicine.  Watch closely for changes in your health, and be sure to contact your doctor if:  ?? You are not getting better after taking an antibiotic for 2 days.  ?? Your symptoms go away but then come back.  Where can you learn more?  Go to [employer]://www.healthwise.net/[employer].  Enter [phone] in the search box to learn more about "Urinary Tract Infection in Women: Care Instructions."  Current as of: April 03, 2015  Content Version: 11.3  ?? 2006-2017 [employer]. Care instructions adapted under license by [employer] (which disclaims liability or warranty for this information). If you have questions about a medical condition or this instruction, always ask your healthcare professional. [employer] disclaims any warranty or liability for your use of this information.  MyChart Activation    Thank you for requesting access to MyChart. Please follow the instructions below to securely access and download your online medical record. MyChart allows you to send messages to your doctor, view your test results, renew your prescriptions, schedule appointments, and more.    How Do I Sign Up?    1. In your internet browser, go to [email or web address]  2. Click on the First Time User? Click Here link in the Sign In box. You will be redirect to the New Member Sign Up page.  3. Enter your MyChart Access Code exactly as it appears below. You will not need to use this code after you???ve completed the sign-up process. If you do not sign up before the expiration date, you must request a new code.    MyChart Access Code: [email or web address]   Expires: 02/22/2016 11:40 AM (This is the date your MyChart access code will expire)    4. Enter the last four digits of your Social Security Number (xxxx) and Date of Birth (mm/dd/yyyy) as indicated and click Submit. You  will be taken to the next sign-up page.  5. Create a MyChart ID. This will be your MyChart login ID and cannot be changed, so think of one that is secure and easy to remember.  6. Create a MyChart password. You can change your password at any time.  7. Enter your Password Reset Question and Answer. This can be used at a later time if you forget your password.   8. Enter your e-mail address. You will receive e-mail notification when new information is available in MyChart.  9. Click Sign Up. You can now view and download portions of your medical record.  10. Click the Download Summary menu link to download a portable copy of your medical information.    Additional Information    If you have questions, please visit the Frequently Asked Questions section of the MyChart website at [email or web address] Remember, MyChart is NOT to be used for urgent needs. For medical emergencies, dial 911.    Patient armband removed and given to patient to take home.  Patient was informed of the privacy risks if armband lost or stolen    DISCHARGE SUMMARY from Nurse    The following personal items are in your possession at time of discharge:    Dental Appliances: None  Visual Aid: None     Home Medications: None  Jewelry: None  Clothing: Pants, Socks, Footwear  Other Valuables: None             PATIENT INSTRUCTIONS:    After general anesthesia or intravenous sedation, for 24 hours or while taking prescription Narcotics:  ?? Limit your activities  ?? Do not drive and operate hazardous machinery  ?? Do not make important personal or business decisions  ?? Do  not drink alcoholic beverages  ?? If you have not urinated within 8 hours after discharge, please contact your surgeon on call.     Report the following to your surgeon:  ?? Excessive pain, swelling, redness or odor of or around the surgical area  ?? Temperature over 100.5  ?? Nausea and vomiting lasting longer than 4 hours or if unable to take medications  ?? Any signs of decreased circulation or nerve impairment to extremity: change in color, persistent  numbness, tingling, coldness or increase pain  ?? Any questions        What to do at Home:  Recommended activity: Activity as tolerated    If you experience any of the following symptoms increased confusion, nausea and vomiting, chest pain not relieved by resting please follow up with PCP.      *  Please give a list of your current medications to your Primary Care Provider.    *  Please update this list whenever your medications are discontinued, doses are      changed, or new medications (including over-the-counter products) are added.    *  Please carry medication information at all times in case of emergency situations.          These are general instructions for a healthy lifestyle:    No smoking/ No tobacco products/ Avoid exposure to second hand smoke    Surgeon General's Warning:  Quitting smoking now greatly reduces serious risk to your health.    Obesity, smoking, and sedentary lifestyle greatly increases your risk for illness    A healthy diet, regular physical exercise & weight monitoring are important for maintaining a healthy lifestyle    You may be retaining fluid if you have a history  of heart failure or if you experience any of the following symptoms:  Weight gain of 3 pounds or more overnight or 5 pounds in a week, increased swelling in our hands or feet or shortness of breath while lying flat in bed.  Please call your doctor as soon as you notice any of these symptoms; do not wait until your next office visit.    Recognize signs and symptoms of STROKE:    F-face looks uneven    A-arms unable to move or move unevenly    S-speech slurred or non-existent     T-time-call 911 as soon as signs and symptoms begin-DO NOT go       Back to bed or wait to see if you get better-TIME IS BRAIN.    Warning Signs of HEART ATTACK     Call 911 if you have these symptoms:  ? Chest discomfort. Most heart attacks involve discomfort in the center of the chest that lasts more than a few minutes, or that goes away and comes back. It can feel like uncomfortable pressure, squeezing, fullness, or pain.  ? Discomfort in other areas of the upper body. Symptoms can include pain or discomfort in one or both arms, the back, neck, jaw, or stomach.  ? Shortness of breath with or without chest discomfort.  ? Other signs may include breaking out in a cold sweat, nausea, or lightheadedness.  Don't wait more than five minutes to call 911 ??? MINUTES MATTER! Fast action can save your life. Calling 911 is almost always the fastest way to get lifesaving treatment. Emergency Medical Services staff can begin treatment when they arrive ??? up to an hour sooner than if someone gets to the hospital by car.       The discharge information has been reviewed with the guardian.  The guardian verbalized understanding.    Discharge medications reviewed with the guardian and appropriate educational materials and side effects teaching were provided.

## 2015-11-24 NOTE — Other (Signed)
Bedside and Verbal shift change report given to Doreene Burke, Charity fundraiser (oncoming nurse) by Bary Leriche, RN (offgoing nurse). Report included the following information SBAR, Kardex, MAR and Recent Results.    SITUATION:  Code Status: Full Code  Reason for Admission: Chest pain  Hypomagnesemia  Weakness  Chest pain  Hospital day: 3  Problem List:       Hospital Problems  Never Reviewed          Codes Class Noted POA    Hypomagnesemia ICD-10-CM: E83.42  ICD-9-CM: 275.2  11/21/2015 Unknown        Chest pain ICD-10-CM: R07.9  ICD-9-CM: 786.50  11/21/2015 Unknown        Weakness ICD-10-CM: R53.1  ICD-9-CM: 780.79  11/21/2015 Unknown        * (Principal)Sepsis (HCC) ICD-10-CM: A41.9  ICD-9-CM: 038.9, 995.91  11/21/2015 Yes              BACKGROUND:   Past Medical History:   Past Medical History:   Diagnosis Date   ??? Arthritis    ??? Diabetes (HCC)    ??? Hypertension       Patient taking anticoagulants yes    Patient has a defibrillator: no    History of shots NO for example, flu, pneumonia, tetanus   Isolation History NO for example, MRSA, CDiff    ASSESSMENT:  Changes in Assessment Throughout Shift: NONE  Significant Changes in 24 hours (for example, RR/code, fall)  Patient has Central Line: no   Patient has Foley Cath: no   Mobility Issues  PT  IV Patency  OR Checklist  Pending Tests    Last Vitals:  Vitals w/ MEWS Score (last day)     Date/Time MEWS Score Pulse Resp Temp BP Level of Consciousness SpO2    11/24/15 0430 1 97 18 97.7 ??F (36.5 ??C) 158/67 Alert 98 %    11/24/15 0036 1 90 18 97.6 ??F (36.4 ??C) 153/73 Alert 100 %    11/23/15 2035 1 90 18 97.2 ??F (36.2 ??C) 160/67 Alert 91 %    11/23/15 1648 1 100 17 98.6 ??F (37 ??C) 156/76 Alert 100 %    11/23/15 1249 -- 81 18 -- 152/61 -- 100 %    11/23/15 1238 1 81 18 97.2 ??F (36.2 ??C) 152/61 Alert 100 %    11/23/15 0857 -- 77 18 -- 154/65 -- 99 %    11/23/15 0822 1 77 18 96.9 ??F (36.1 ??C) 154/65 Alert 99 %    11/23/15 0400 1 78 18 98.6 ??F (37 ??C) 152/66 Alert 100 %            PAIN     Pain Assessment    Pain Intensity 1: 0 (11/24/15 0430)              Patient Stated Pain Goal: 0  Intervention effective: yes  Time of last intervention: No c/o pain Reassessment Completed: yes   Other actions taken for pain: Distraction    Last 3 Weights:  Last 3 Recorded Weights in this Encounter    11/21/15 0940 11/22/15 0422   Weight: 38.1 kg (84 lb) 39.9 kg (88 lb)   Weight change:     INTAKE/OUPUT    Current Shift:      Last three shifts: 07/19 1901 - 07/21 0700  In: 360 [P.O.:360]  Out: 1950 [Urine:1950]    RECOMMENDATIONS AND DISCHARGE PLANNING  Patient needs and requests: safety and comfort    Pending tests/procedures: labs  Discharge plan for patient: TBD    Discharge planning Needs or Barriers:NONE    Estimated Discharge Date: TBD Posted on Whiteboard in Patient???s Room: yes       "HEALS" SAFETY CHECK  A safety check occurred in the patient's room between off going nurse and oncoming nurse listed above.    The safety check included the below items:    H  High Alert Medications Verify all high alert medication drips (heparin, PCA, etc.)  E  Equipment Suction is set up for ALL patients (with yanker)  Red plugs utilized for all equipment (IV pumps, etc.)  WOW???s wiped down at end of shift.  Room stocked with oxygen, suction, and other unit-specific supplies  A  Alarms Bed alarm is set for fall risk patients  Ensure chair alarm is in place and activated if patient is up in a chair  L  Lines Check IV for any infiltration  Foley bag is empty if patient has a Foley   Tubing and IV bags are labeled  S  Safety  Room is clean, patient is clean, and equipment is clean.  Hallways are clear from equipment besides carts.   Fall bracelet on for fall risk patients  Ensure room is clear and free of clutter  Suction is set up for ALL patients (with yanker)  Hallways are clear from equipment besides carts.   Isolation precautions followed, supplies available outside room, sign posted    Bary LericheJames Binarao, RN

## 2015-11-24 NOTE — Other (Signed)
IDR/SLIDR Summary          [name] MRN: [id number]    Age: 82 y.o.     Birthdate: Sep 25, 1921 Room/Bed: 454/01   Admit Diagnosis: Chest pain  Hypomagnesemia  Weakness  Chest pain  Principal Diagnosis: Sepsis (HCC)   Goals: To ambulate without a fall. Be able to sit in chair for 1-2 hours.   Readmission: NO  Quality Measure: SEPSIS  VTE Prophylaxis: Chemical  Influenza Vaccine screening completed? NO  Pneumococcal Vaccine screening completed? NO  Mobility needs: Yes   Nutrition plan:Yes  Consults:P.T    Financial concerns:No  Escalated to CM? YES  RRAT Score: 12   Interventions:H2H  Testing due for pt today? NO  LOS: 3 days Expected length of stay 4 days  Discharge plan: Home with daugther   PCP: Pershing ProudStephen Brawley, MD  Transportation needs: No    Days before discharge:ready for discharge  Discharge disposition: Home    Signed:     Winfield RastSherita D Gunn, RN  11/24/2015  11:16 AM

## 2015-11-24 NOTE — Progress Notes (Signed)
[facility] Readmission Patient Interview    The following is related to the patient's last admission and the events following discharge from the hospital:      Date of last hospital discharge:  12/05/15    Date of Readmission:11/21/15      Reason for Readmission: GI BLEED     Were you kept informed about your diagnoses during your stay in the hospital and what was being done to further evaluate and treat them? * in reference to index admission*      Interviewer Notes:        None of time    Some of time    Most of time    All of time   At the time of your discharge, did someone talk to you about:  *if patient answers yes - ask them to recall what information they remember*     1.  What your diagnoses were?  2.  What tests or procedures needed to be done after you left?  3.  What to watch out for regarding worsening of your disease?  4.  What to do if you were experiencing worsening of your disease?  5.  Who to contact (and how) if you were experiencing worsening of your disease?    Interviewer Notes:            Yes   No   Not Sure    Yes   No   Not Sure    Yes   No   Not Sure    Yes   No   Not Sure    Yes   No   Not Sure   Were you asked about your understanding of these instructions?    Interviewer Notes:     Yes   No   Not Sure   Were the discharge instructions written down and given to you before you left?    Interviewer Notes:     Yes   No   Not Sure   Were the written discharge instructions and plans easy to read and understand?    Interviewer Notes:     Yes   No   Not Sure   How confident were you about understanding these instructions?    Interviewer Notes:   Very confident    Somewhat confident    Not confident    Not Sure   Do you have a regular doctor who takes care of you for most things?   Primary Providers Name/Practice Name:     Yes   No   Not Sure   At the time of your discharge, did someone talk to you about which medications to take when you left and which ones to discontinue?     Interviewer Notes:     Yes   No   Not Sure   Did you take your medications as they were prescribed?     Interviewer Notes:     Yes   No   Not Sure   If not, what were the difficulties you experienced with taking your medications?   Comments:          After you left the hospital, did you have an appointment with your doctor?    Yes   No   Not Sure     If yes, who made the appointment?    I did     My family    Hospital staff    Not Sure  Were you able to get to this appointment?     Interviewer Notes:     Yes   No   Not Sure   How do you think you became sick enough to be readmitted to the hospital?   Comment:      I Hydrographic surveyor ILL    Source:  Modified from [facility], [place], [place]

## 2015-11-24 NOTE — Progress Notes (Signed)
Problem: Mobility Impaired (Adult and Pediatric)  Goal: *Acute Goals and Plan of Care (Insert Text)  STG???s to be addressed within 3 days:  1. Bed mobility: Supine to sit to supine S with HR for meals.  2. Activity tolerance: Tolerate up in chair 1-2 hrs for ADL???s.  3. Transfers: Sit to stand to chair S with LRAD for ADL???s.    LTG???s to be addressed within 7 days:  1. Standing/Ambulation Balance: Increase to Good with LRAD for safe transfers and gait.  2. Ambulation: Ambulate > 150 ft. S with LRAD for home mobility.  3. Patient Education: Independent with HEP for home safety.   PHYSICAL THERAPY TREATMENT     [name] ([age] y.o. female)  Date: 11/24/2015  Diagnosis: Chest pain  Hypomagnesemia  Weakness  Chest pain Sepsis [facility](HCC)       Precautions: Fall, Skin  Chart, physical therapy assessment, plan of care and goals were reviewed.      ASSESSMENT:  Pt demo's significantly increased activity tolerance as indicated by clear ability to increase single trial ambulation distance with decreased assist req'd.  Pt is agreeable and receptive to all safety recommendations in regard to transfers,however, will require reinforcement.    Progression toward goals:  [ ]       Improving appropriately and progressing toward goals  [X]       Improving slowly and progressing toward goals  [ ]       Not making progress toward goals and plan of care will be adjusted       PLAN:  Patient continues to benefit from skilled intervention to address the above impairments.  Continue treatment per established plan of care.  Discharge Recommendations:  Home Health  Further Equipment Recommendations for Discharge:  N/A pt has DME          SUBJECTIVE:   Patient stated ???I can do it when my legs aren't giving me a fit.???      OBJECTIVE DATA SUMMARY:   Critical Behavior:  Neurologic State: Alert  Orientation Level: Oriented to person, Oriented to situation  Cognition: Follows commands, Poor safety awareness   Safety/Judgement: Fall prevention, Decreased awareness of environment, Decreased insight into deficits  Functional Mobility Training:  Bed Mobility:  Rolling: Supervision  Supine to Sit: Supervision  Transfers:  Sit to Stand: Supervision (verbal cues for safety and technique)  Stand to Sit: Supervision  Bed to Chair: Stand-by asssistance  Other: stand step with Rw  Balance:  Sitting: Intact  Standing: Impaired;With support  Standing - Static: Good  Standing - Dynamic : Fair  Ambulation/Gait Training:  Distance (ft): 30 Feet (ft)  Assistive Device: Walker, rolling  Ambulation - Level of Assistance: Stand-by asssistance  Gait Abnormalities: Decreased step clearance  Base of Support: Narrowed  Speed/Cadence: Slow  Interventions: Verbal cues (for optimal proximity to RW)  Therapeutic Exercises:      Pain:  Pt reports 0/10 pain or discomfort prior to treatment.    Pt reports 0/10 pain or discomfort post treatment.   Activity Tolerance:   Fair/fair+  Please refer to the flowsheet for vital signs taken during this treatment.  After treatment:   [X]  Patient left in no apparent distress sitting up in chair  [ ]  Patient left in no apparent distress in bed  [X]  Call bell left within reach  [ ]  Nursing notified  [ ]  Caregiver present  [ ]  Bed alarm activated      Governor RooksIan Kincade, PTA   Time Calculation: 18 mins

## 2015-11-24 NOTE — Consults (Signed)
[facility] - Progress Note  Admit Date: 11/21/2015    Assessment:     Hospital Problems  Never Reviewed          Codes Class Noted POA    Hypomagnesemia ICD-10-CM: E83.42  ICD-9-CM: 275.2  11/21/2015 Unknown        Chest pain ICD-10-CM: R07.9  ICD-9-CM: 786.50  11/21/2015 Unknown        Weakness ICD-10-CM: R53.1  ICD-9-CM: 780.79  11/21/2015 Unknown        * (Principal)Sepsis (HCC) ICD-10-CM: A41.9  ICD-9-CM: 038.9, 995.91  11/21/2015 Yes              -Presented with nausea and vomiting with progressive weight loss.  -Encephalopathy with agitation requiring restraints, improved.  -Sepsis from urinary tract source, improving with abx and hydration.  -Abnormal echocardiogram. Hyperdynamic LV function EF 65-70% with dynamic LV midcavity obliteration. Likely related to patients volume depletion.  -Chest pain. Troponin indeterminate and not consistent with ACS. ECG however abnormal with inferolateral T wave abnormalities which are reportably new however no tracings available for direct review.  -Anemia with slight decline.  -Electrolyte imbalance with hypokalemia and hypomagnesemia being replaced.  -Diabetes mellitus.  -Hypertension.    ??  Plan:     Would continue treatment of underlying infectious process and continue to encourage oral intake.  Would avoid dehydration and continue to maximize BB.  Would consider ASA when Hgb stable.  Lipid panel pending, statin if indicated.  Would continue conservative management from cardiac standpoint in setting of advanced age unless develops unstable cardiac symptoms. No further cardiac work-up at this time.    Subjective:     Out of restraints this Am. No agitation this AM. No CP. No SOB.     Objective:      Patient Vitals for the past 8 hrs:   Temp Pulse Resp BP SpO2   11/24/15 0756 98.3 ??F (36.8 ??C) 85 18 153/69 98 %   11/24/15 0430 97.7 ??F (36.5 ??C) 97 18 158/67 98 %         Patient Vitals for the past 96 hrs:   Weight   11/22/15 0422 39.9 kg (88 lb)    11/21/15 0940 38.1 kg (84 lb)                    Intake/Output Summary (Last 24 hours) at 11/24/15 1043  Last data filed at 11/24/15 16100814   Gross per 24 hour   Intake              360 ml   Output             1400 ml   Net            -1040 ml       Physical Exam:  General:  alert, cooperative, no distress, appears stated age  Neck:  no JVD  Lungs:  clear to auscultation bilaterally  Heart:  regular rate and rhythm 2/6 systolic murmur   Abdomen:  abdomen is soft without significant tenderness, masses, organomegaly or guarding  Extremities:  extremities normal, atraumatic, no cyanosis or edema    Data Review:     Labs: Results:       Chemistry Recent Labs      11/24/15   0225  11/23/15   0309  11/22/15   1715  11/22/15   0224   GLU  102*  100*   --   93   NA  141  142   --   142   K  3.6  3.9  4.2  2.7*   CL  105  106   --   103   CO2  27  27   --   28   BUN  7  9   --   13   CREA  0.56*  0.57*   --   0.75   CA  8.6  8.8   --   8.7   MG  1.8  1.5*   --   1.8   AGAP  9  9   --   11   BUCR  13  16   --   17      CBC w/Diff Recent Labs      11/24/15   0225  11/23/15   0309  11/22/15   0224   WBC  6.6  9.2  10.4   RBC  3.59*  4.00*  3.81*   HGB  8.7*  9.9*  9.3*   HCT  27.0*  31.4*  29.1*   PLT  214  183  218   GRANS  66  83*  74*   LYMPH  20*  6*  14*   EOS  1  0  0      Cardiac Enzymes No results found for: CPK, CKMMB, CKMB, RCK3, CKMBT, CKNDX, CKND1, MYO, TROPT, TROIQ, TROI, TROPT, TNIPOC, BNP, BNPP   Coagulation No results for input(s): PTP, INR, APTT in the last 72 hours.    No lab exists for component: INREXT    Lipid Panel No results found for: CHOL, CHOLPOCT, CHOLX, CHLST, CHOLV, 884269, HDL, LDL, LDLC, DLDLP, 638756, VLDLC, VLDL, TGLX, TRIGL, TRIGP, TGLPOCT, CHHD, CHHDX   BNP No results found for: BNP, BNPP, XBNPT   Liver Enzymes No results for input(s): TP, ALB, TBIL, AP, SGOT, GPT in the last 72 hours.    No lab exists for component: DBIL   Digoxin     Thyroid Studies No results found for: T4, T3U, TSH, TSHEXT       Signed By: Tiney Rouge, PA     November 24, 2015

## 2015-11-24 NOTE — Progress Notes (Signed)
Intern Progress Note  [facility]       [name] MRN: [id number]  CSN: [id number]    Date of Birth: 1922-03-05  Age: 82 y.o.  Sex: female    DOA: 11/21/2015 LOS:  LOS: 3 days                    Subjective:     Acute events: Confused again overnight. Pulled out her IVs again.     [name] Juarez was resting in bed, eating breakfast. Very pleasant this morning, recalled plan for discharge today. No complaints and is ready to go home. See pertinent positive and negatives.     Review of Systems   Eyes: Negative for blurred vision and double vision.   Respiratory: Negative for cough, shortness of breath and wheezing.    Cardiovascular: Negative for chest pain and palpitations.   Gastrointestinal: Negative for constipation, diarrhea, nausea and vomiting.   Genitourinary: Negative for dysuria and urgency.   Skin: Negative for rash.   Neurological: Negative for dizziness and headaches.       Objective:      Patient Vitals for the past 24 hrs:   Temp Pulse Resp BP SpO2   11/24/15 0430 97.7 ??F (36.5 ??C) 97 18 158/67 98 %   11/24/15 0036 97.6 ??F (36.4 ??C) 90 18 153/73 100 %   11/23/15 2035 97.2 ??F (36.2 ??C) 90 18 160/67 91 %   11/23/15 1648 98.6 ??F (37 ??C) 100 17 156/76 100 %   11/23/15 1249 - 81 18 152/61 100 %   11/23/15 1238 97.2 ??F (36.2 ??C) 81 18 152/61 100 %   11/23/15 0857 - 77 18 154/65 99 %   11/23/15 0822 96.9 ??F (36.1 ??C) 77 18 154/65 99 %         Intake/Output Summary (Last 24 hours) at 11/24/15 0741  Last data filed at 11/24/15 0453   Gross per 24 hour   Intake              360 ml   Output             1150 ml   Net             -790 ml       Physical Exam   Constitutional: She appears well-developed.   HENT:   Head: Normocephalic.   Cardiovascular: Normal rate and regular rhythm.  Exam reveals no gallop and no friction rub.    Systolic murmur   Pulmonary/Chest: Effort normal and breath sounds normal. No respiratory distress. She has no wheezes. She has no rales.    Abdominal: Soft. Bowel sounds are normal. She exhibits no distension. There is no tenderness.   Musculoskeletal: She exhibits no edema.   Neurological: She is alert.   Skin: Skin is warm and dry. No rash noted.       Lab/Data Reviewed:  BMP:   Lab Results   Component Value Date/Time    NA 141 11/24/2015 02:25 AM    K 3.6 11/24/2015 02:25 AM    CL 105 11/24/2015 02:25 AM    CO2 27 11/24/2015 02:25 AM    AGAP 9 11/24/2015 02:25 AM    GLU 102 (H) 11/24/2015 02:25 AM    BUN 7 11/24/2015 02:25 AM    CREA 0.56 (L) 11/24/2015 02:25 AM    GFRAA >60 11/24/2015 02:25 AM    GFRNA >60 11/24/2015 02:25 AM     CBC:  Lab Results   Component Value Date/Time    WBC 6.6 11/24/2015 02:25 AM    HGB 8.7 (L) 11/24/2015 02:25 AM    HCT 27.0 (L) 11/24/2015 02:25 AM    PLT 214 11/24/2015 02:25 AM        Scheduled Medications Reviewed:  Current Facility-Administered Medications   Medication Dose Route Frequency   ??? levoFLOXacin (LEVAQUIN) tablet 250 mg  250 mg Oral Q24H   ??? Saccharomyces boulardii (FLORASTOR) capsule 250 mg  250 mg Oral BID   ??? cloNIDine HCl (CATAPRES) tablet 0.1 mg  0.1 mg Oral DAILY   ??? insulin lispro (HUMALOG) injection   SubCUTAneous AC&HS   ??? ferrous sulfate tablet 325 mg  325 mg Oral TID WITH MEALS   ??? lisinopril-hydroCHLOROthiazide (PRINZIDE, ZESTORETIC) 20-12.5 mg per tablet 2 Tab  2 Tab Oral DAILY   ??? metoprolol succinate (TOPROL-XL) tablet 100 mg  100 mg Oral DAILY   ??? amLODIPine (NORVASC) tablet 10 mg  10 mg Oral DAILY   ??? heparin (porcine) injection 5,000 Units  5,000 Units SubCUTAneous Q8H         Imaging, microbiology, and EKG/Telemetry:  None new    Assessment/Plan     82 y.o.??female??with PMH significant for type II DM, hypertension and mixed and sensorineural hearing loss??now admitted for chest pain.  ????  Sepsis with a source; UTI 2/4 SIRS criteria??patient received ceftriaxone in the ED. HR 90+, RR 12-24. WBC on admission 9.3 now 6.6. Left shift with  neutrophils 82 on admission. Urinalysis with +LE, nitrates and 3+ bacteria. Lactic acid was elevated at 2.3.  - Blood culture negative (note that this was drawn after administration of ceftriaxone)  - Monitor I/O carefully  - Urinalysis with Klebsiella pneumoniae. Resistant only to ampicillin. Will discontinue zosyn and start levofloxacin 250 QD for 10 days total.   - Probiotics for medication induced diarrhea prophylaxis  - WBC 9.3>10.4>9.2>6.6;  Neutrophils 82>74>83; Lactic Acid 2.3>2.3>1.1   - Will re-evaluate need for soft restraints.   ????  Metabolic encephalopathy The patient normally performs all of her own ADLS, IADLS. When the patient presented with some recent memory lapses noted by her daughter along with confusion as an inpatient. Likely 2/2 to above.   - Some confusion last night, pulled out IV again.   ??  Chest pain??patient reported episodes of chest pressure that resolve with tramadol. EKG in the ED had T wave inversion concerning for ischemia in the inferolateral leads. EKG 08/2015 IS NOT??indicative of T wave inversion in inferolateral leads Troponin 0.02 in the ED, 0.05>0.07>0.6??on repeat. No pain reported at this time   - Pro-BNP 828  - EKG from 7/19 is unchanged from admission EKG.  ??- K 3.6 this morning.   - Magnesium 1.8  - Echo with dynamic obliteration of mid cavity, EF 65%  - Cardiology consulted to rule out clinically significant heart disease in the setting of echo findings and EKG changes on admission. Recommended aspirin and beta blocker therapy (current home medications).   ????  Nausea/vomitting??patient reports nausea and vomiting over the last three weeks  - Zofran 4 mg Q4H PRN  ????  Controlled DM II??Last A1C 6.6. Patient is taking pioglitizone-metformin 15-850 BID at home. States her blood sugars stay below 140.   - SSI  - ACHS glucose checks  - Diabetic diet  ????  Hypertension??Range of 128/52-173/68 at admission. Taking metoprolol 100 mg  every day, amlodipine-atorvastatin 10-40 mg every day, lisinopril-HCTZ 20-12.5, 2 tablets every day,??clonidine 0.1 mg  BID.   - Continue metoprolol 100 mg every day  - Continue amlodipine 10 mg every day  - Continue lisinopril-HCTZ 20-12.5, 2 tablets every day   - Hold atorvastatin 40 mg every day  - Continue clonidine at 0.1 per day and tapering off clonidine after 3-7 days.   - BP in the last 24 hours mid 150's/60's  ????  Hearing loss??[facility], and was found to??have moderately-severe to profound mixed hearing loss in the right ear and a moderate sloping to severe sensorineural hearing loss in the left ear.   - Will give the patient information to follow up  ????  Microcytic anemia??Hb 9.6 on last outpatient labs 11/14/2015. Hb 10.7, MCV 76.3 on admission.   - Continue home ferrous sulfate delayed release TID  - Hemoccult negative  - Hb 9.3 > 9.9 > 8.7. Likely dilutional.   ????  Diet: Diabetic  DVT Prophylaxis: Heparin   Code Status: DNR  Point of Contact: Venida JarvisMadie Sorg [phone]  ????  Disposition and anticipated LOS: 2+ midnights  ????  Westley HummerSarai Deshannon Seide, MD??  PGY-1??[facility] Resident  [employer]

## 2015-11-24 NOTE — Med Student Progress Note (Signed)
*ATTENTION:  This note has been created by a medical student for educational purposes only.  Please do not refer to the content of this note for clinical decision-making, billing, or other purposes.  Please see attending physician???s note to obtain clinical information on this patient.*       Intern Progress Note  [facility]       [name] MRN: [id number]  CSN: [id number]    Date of Birth: 01-21-1922  Age: 82 y.o.  Sex: female    DOA: 11/21/2015 LOS:  LOS: 3 days                    Subjective:     The patient is lying comfortably in bed. She has no complaints and is ready to go home. She had another episode of confusion last night and is in soft restraints.    Review of Systems   Constitutional: Negative for malaise/fatigue.   Respiratory: Negative for shortness of breath.    Cardiovascular: Negative for chest pain and palpitations.   Gastrointestinal: Negative for abdominal pain, nausea and vomiting.   Genitourinary: Negative for dysuria.   Neurological: Negative for headaches.       Objective:      Patient Vitals for the past 24 hrs:   Temp Pulse Resp BP SpO2   11/24/15 0756 98.3 ??F (36.8 ??C) 85 18 153/69 98 %   11/24/15 0430 97.7 ??F (36.5 ??C) 97 18 158/67 98 %   11/24/15 0036 97.6 ??F (36.4 ??C) 90 18 153/73 100 %   11/23/15 2035 97.2 ??F (36.2 ??C) 90 18 160/67 91 %   11/23/15 1648 98.6 ??F (37 ??C) 100 17 156/76 100 %   11/23/15 1249 - 81 18 152/61 100 %         Intake/Output Summary (Last 24 hours) at 11/24/15 1243  Last data filed at 11/24/15 0814   Gross per 24 hour   Intake              360 ml   Output             1400 ml   Net            -1040 ml       Physical Exam   Constitutional: She appears well-developed.   Cardiovascular: Normal rate and regular rhythm.    Murmur heard.   Systolic murmur is present   Pulses:       Dorsalis pedis pulses are 2+ on the right side, and 2+ on the left side.   Pulmonary/Chest: Effort normal and breath sounds normal. She has no wheezes.    Abdominal: Soft. She exhibits no distension. There is no tenderness.       Lab/Data Reviewed:  Hgb has decreased since admission 10.7>9.3>9.9>8.7    Scheduled Medications Reviewed:  Current Facility-Administered Medications   Medication Dose Route Frequency   ??? levoFLOXacin (LEVAQUIN) tablet 250 mg  250 mg Oral Q24H   ??? Saccharomyces boulardii (FLORASTOR) capsule 250 mg  250 mg Oral BID   ??? cloNIDine HCl (CATAPRES) tablet 0.1 mg  0.1 mg Oral DAILY   ??? insulin lispro (HUMALOG) injection   SubCUTAneous AC&HS   ??? ferrous sulfate tablet 325 mg  325 mg Oral TID WITH MEALS   ??? lisinopril-hydroCHLOROthiazide (PRINZIDE, ZESTORETIC) 20-12.5 mg per tablet 2 Tab  2 Tab Oral DAILY   ??? metoprolol succinate (TOPROL-XL) tablet 100 mg  100 mg Oral DAILY   ??? amLODIPine (  NORVASC) tablet 10 mg  10 mg Oral DAILY   ??? heparin (porcine) injection 5,000 Units  5,000 Units SubCUTAneous Q8H         Assessment/Plan     82 y.o.??female??with PMH significant for diabetes type 2, hypertension was admitted for sepsis secondary to UTI.  ????  Sepsis with a source; UTI 2/4 SIRS criteria??Patient has continued to improve. Continue with levofloxacin for 10 days total.   ????  Metabolic encephalopathy??The patient had another episode of confusion last night. Will reevaluate need for soft restraints.    Diet: Diabetic  DVT Prophylaxis: Heparin      Myra GianottiEmma Rudebusch, M3  [employer]

## 2015-11-24 NOTE — Other (Signed)
Diabetes Patient/Family Education Record    Factors That  May Influence Patients Ability  to Learn or  Comply With  Recommendations:       Language barrier       Cultural needs      Motivation       Cognitive limitation       Physical      Education       Physiological factors      Hearing/vision/speaking impairment      Religious beliefs       Financial factors     Other:     No factors identified at this time.     Person Instructed:      Patient: Patient awake, alert and answered questions regarding home diabetes management. Stated that she lives with a daughter who is her primary caregiver.      Family     Other     Preference for Learning:      Verbal      Written     Demonstration     Level of Comprehension & Competence:      Good                                       Fair                                       Poor                               Needs Reinforcement     Teachback completed    Education Component:     Medication management, including how to administer insulin (if appropriate) and potential medication interactions: Patient stated that she cannot remember the name of her diabetes pill but she only has one medicine for diabetes which she takes twice daily with food. Noted that combination drug, ActoPlusMet is listed prior to admission. Dose: 15-850 mg.      Nutritional management including the role of carbohydrate intake: Patient stated that she is eating 3 meals daily and she has good appetite, and only rarely eat or drink anything sweet because she doesn't really enjoy them.     Exercise: Patient stated that she ambulates household distance with use of a cane. She like to stay active.     Signs, symptoms, and treatment of hyperglycemia and hypoglycemia: Patient stated that she cannot recall ever having a problem with low blood sugar in a long time, but knows how she would feel it if happens. Patient stated that she keeps juice in the house and can use it if she experience  low blood sugar. Patient also stated that she is eating 3 meals daily and she has good appetite, and only rarely eat or drink anything sweet because she doesn't really enjoy them.    Treatment of hyperglycemia and hypoglycemia     Importance of blood glucose monitoring and how to obtain a blood glucose meter: Patient stated that she BG meter at home. She lives with her daughter, Norita Meigs, who is checking her blood sugar regularly every evening.      Instruction on use of blood glucose meter     Discuss the importance of HbA1C monitoring and provide patient with  results     Sick day guidelines     Proper use and disposal of lancets, needles, syringes or insulin pens (if appropriate)     Potential long-term complications (retinopathy, kidney disease, neuropathy, heart disease, stroke, vascular disease, foot care)    Provide emergency contact number and contact number for more information      Goal:  Patient/family will demonstrate understanding of Diabetes Self Management Skills by: 12/01/2015  Plan for post-discharge education or self-management support:     Outpatient class schedule provided             Patient Declined     Scheduled for outpatient classes (date) _______         Sharyne RichtersArnel Rodriguez, RN

## 2015-11-24 NOTE — Other (Signed)
Glycemic Control Plan of Care    POC BG range on 11/23/2015: 141-254 mg/dL.  POC BG report on 11/24/2015 at time of review: 197, 224 mg/dL.    Patient awake, alert and answered questions (assessment) without any problem regarding home diabetes management.    Recommendation(s):  1.) Continue correctional lispro insulin.    Assessment:  Patient is 82 year old with past medical history including type 2 diabetes mellitus, hypertension, arthritis, and hard of hearing - was admitted on 11/21/2015 with c/o nausea and vomiting.    Noted:  Sepsis, UTI.  Type 2 diabetes mellitus.    Most recent blood glucose values:    Results for [name] Juarez, [name] (MRN [id number]) as of 11/24/2015 14:04   Ref. Range 11/23/2015 09:49 11/23/2015 11:35 11/23/2015 22:54   GLUCOSE,FAST - POC Latest Ref Range: 70 - 110 mg/dL 409141 (H) 811254 (H) 914194 (H)     Results for [name] Juarez, [name] (MRN [id number]) as of 11/24/2015 14:04   Ref. Range 11/24/2015 07:57 11/24/2015 12:42   GLUCOSE,FAST - POC Latest Ref Range: 70 - 110 mg/dL 086197 (H) 578224 (H)     Current A1C:  6.6% (documented in H&P)    Current hospital diabetes medications:  Correctional lispro insulin ACHS. Very resistant dose.    Total daily dose insulin requirement previous day: 11/23/2015  Lispro: 12 units    Home diabetes medications:   Patient stated on 11/24/2015 that she cannot remember the name of her diabetes pill but she only has one medicine for diabetes which she takes twice daily with food. Noted that combination drug, ActoPlusMet is listed prior to admission. Dose: 15-850 mg.     Diet: Diabetic consistent carb regular; nutr suppl: glucerna shake lunch and dinner.    Goals:  Blood glucose will be within target range of 70-180 mg/dL by 46/96/295207/24/2017    Education:  _X__  Refer to Diabetes Education Record: 11/24/2015             ___  Education not indicated at this time    [name] RichtersArnel Rodriguez, RN

## 2015-11-24 NOTE — Consults (Signed)
Consults by Tiney Rouge, PA at 11/24/15 1042                Author: Tiney Rouge, PA  Service: Cardiology  Author Type: Physician Assistant       Filed: 11/24/15 1048  Date of Service: 11/24/15 1042  Status: Attested           Editor: Tiney Rouge, PA (Physician Assistant)  Cosigner: Arletha Pili, MD at 11/24/15 1502          Attestation signed by Arletha Pili, MD at 11/24/15 1502          I saw, examined, and evaluated the patient.  I personally reviewed the patient's labs, tests, vitals, orders, medications, updated history, and other providers  assessments.  I personally agree with the findings as stated and the plan as documented.      Echo with normal EF, resting comfortably without chest pain.  Ongoing treatment for infection.  No further cardiac workup, please call if questions.                                 [facility] - Progress Note   Admit Date: 11/21/2015        Assessment:           Hospital Problems   Never Reviewed                Codes  Class  Noted  POA              Hypomagnesemia  ICD-10-CM: E83.42   ICD-9-CM: 275.2    11/21/2015  Unknown                        Chest pain  ICD-10-CM: R07.9   ICD-9-CM: 786.50    11/21/2015  Unknown                        Weakness  ICD-10-CM: R53.1   ICD-9-CM: 780.79    11/21/2015  Unknown                        * (Principal)Sepsis (HCC)  ICD-10-CM: A41.9   ICD-9-CM: 038.9, 995.91    11/21/2015  Yes                          -Presented with nausea and vomiting with progressive weight loss.   -Encephalopathy with agitation requiring restraints, improved.   -Sepsis from urinary tract source, improving with abx and hydration.   -Abnormal echocardiogram. Hyperdynamic LV function EF 65-70% with dynamic LV midcavity obliteration. Likely related to patients volume depletion.   -Chest pain. Troponin indeterminate and not consistent with ACS. ECG however abnormal with inferolateral T wave abnormalities which are reportably new however no tracings  available for direct review.   -Anemia with slight decline.   -Electrolyte imbalance with hypokalemia and hypomagnesemia being replaced.   -Diabetes mellitus.   -Hypertension.      ??     Plan:        Would continue treatment of underlying infectious process and continue to encourage oral intake.   Would avoid dehydration and continue to maximize BB.   Would consider ASA when Hgb stable.   Lipid panel pending, statin if indicated.   Would continue conservative management from cardiac  standpoint in setting of advanced age unless develops unstable cardiac symptoms. No further cardiac work-up at this time.        Subjective:        Out of restraints this Am. No agitation this AM. No CP. No SOB.         Objective:         Patient Vitals for the past 8 hrs:            Temp  Pulse  Resp  BP  SpO2     11/24/15 0756  98.3 ??F (36.8 ??C)  85  18  153/69  98 %     11/24/15 0430  97.7 ??F (36.5 ??C)  97  18  158/67  98 %             Patient Vitals for the past 96 hrs:        Weight     11/22/15 0422  39.9 kg (88 lb)     11/21/15 0940  38.1 kg (84 lb)                             Intake/Output Summary (Last 24 hours) at 11/24/15 1043  Last data filed at 11/24/15 0814        Gross per 24 hour     Intake               360 ml     Output              1400 ml     Net             -1040 ml           Physical Exam:   General:  alert, cooperative, no distress, appears stated age   Neck:  no JVD   Lungs:  clear to auscultation bilaterally   Heart:  regular rate and rhythm 2/6 systolic murmur    Abdomen:  abdomen is soft without significant tenderness, masses, organomegaly or guarding   Extremities:  extremities normal, atraumatic, no cyanosis or edema      Data Review:          Labs:  Results:                  Chemistry  Recent Labs          11/24/15    0225   11/23/15    0309   11/22/15    1715   11/22/15    0224      GLU   102*   100*    --    93      NA   141   142    --    142      K   3.6   3.9   4.2   2.7*      CL   105   106    --     103      CO2   27   27    --    28      BUN   7   9    --    13      CREA   0.56*   0.57*    --    0.75      CA   8.6   8.8    --  8.7      MG   1.8   1.5*    --    1.8      AGAP   9   9    --    11      BUCR   13   16    --    17                 CBC w/Diff  Recent Labs          11/24/15    0225   11/23/15    0309   11/22/15    0224      WBC   6.6   9.2   10.4      RBC   3.59*   4.00*   3.81*      HGB   8.7*   9.9*   9.3*      HCT   27.0*   31.4*   29.1*      PLT   214   183   218      GRANS   66   83*   74*      LYMPH   20*   6*   14*      EOS   1   0   0                 Cardiac Enzymes  No results found for: CPK, CKMMB, CKMB, RCK3, CKMBT, CKNDX, CKND1, MYO, TROPT, TROIQ, TROI, TROPT, TNIPOC, BNP, BNPP     Coagulation  No results for input(s): PTP, INR, APTT in the last 72 hours.      No lab exists for component: INREXT      Lipid Panel  No results found for: CHOL, CHOLPOCT, CHOLX, CHLST, CHOLV, 884269, HDL, LDL, LDLC, DLDLP, [id number], VLDLC, VLDL, TGLX, TRIGL, TRIGP, TGLPOCT, CHHD, CHHDX     BNP  No results found for: BNP, BNPP, XBNPT     Liver Enzymes  No results for input(s): TP, ALB, TBIL, AP, SGOT, GPT in the last 72 hours.      No lab exists for component: DBIL        Digoxin          Thyroid Studies  No results found for: T4, T3U, TSH, TSHEXT             Signed By:  Tiney RougeAmy C Azul Coffie, PA           November 24, 2015

## 2015-11-24 NOTE — Discharge Summary (Signed)
Discharge Summary by Bubba Hales, MD at 11/24/15 [phone]                Author: Bubba Hales, MD  Service: FAMILY MEDICINE  Author Type: Resident       Filed: 11/26/15 2150  Date of Service: 11/24/15 0739  Status: Attested           Editor: Bubba Hales, MD (External Physician)  Cosigner: Myrle Sheng, MD at 11/26/15 5176          Attestation signed by Myrle Sheng, MD at 11/26/15 2301                  See resident's note for details. above.  I have personally seen and evaluated this patient.  I have discussed this patient with resident and team.  I have reviewed resident note and  agree with above.  Patient seen on discharge.                                                  Discharge Summary   [facility]              [name]  Age: 82 y.o.   Sex: female   DOB: [birth year]           MRN: [id number]           DOA: 11/21/2015           Discharge Date: 11/22/2015           Attending:Bruce Toribio Harbour, MD           PCP: [name], MD              ================================================================      Reason for Admission:    Chest pain   Hypomagnesemia   Weakness   Chest pain   Sepsis 2/2 UTI   Hypokalemia      Discharge Diagnoses:    Sepsis 2/2 UTI (resolving)   Hypokalemia (resolved)   Metabolic encephalopathy 2/2 sepsis (resolving)   DM II (controlled)   Hypertension (controlled)   Hearing loss (pre-existing and ongoing)   Microcytic anemia (stable)      Important notes to PCP/ follow-up studies and evaluations    - Started tapering clonidine as inpatient. Was on 0.1 mg clonidine PO BID. Decreased the dose by 0.1 mg 11/22/2015, final dose of taper by 12/01/2015.   - Discontinued her pioglitizone due to concern for cardiovascular risk. Please follow up on her blood sugar control.    - Please consider doing a MOCA/Mini cog as outpatient. Memory loss and confusion as an inpatient.    - Please inquire as to ability to perform ADLs/IADLs   -  Please follow up on diet. The patient has lost 18 pounds in the last four months, possibly almost all from the last month.       Pending labs and studies:   None   Operative Procedures:    None      Discharge Medications:        Current Discharge Medication List              START taking these medications          Details        amLODIPine (NORVASC) 10 mg tablet  Take 1 Tab by  mouth daily for 30 days.   Qty: 30 Tab, Refills:  0               levoFLOXacin (LEVAQUIN) 250 mg tablet  Take 1 Tab by mouth every twenty-four (24) hours for 8 days.   Qty: 8 Tab, Refills:  0               metFORMIN (GLUCOPHAGE) 850 mg tablet  Take 1 Tab by mouth two (2) times a day for 30 days.   Qty: 60 Tab, Refills:  0                     CONTINUE these medications which have CHANGED          Details        cloNIDine HCl (CATAPRES) 0.1 mg tablet  Take 1 Tab by mouth daily for 5 doses. Take one every day then starting Monday November 27 2015 please take this medication every other day until finished   Qty: 5 Tab, Refills:  0                     CONTINUE these medications which have NOT CHANGED          Details        metoprolol succinate (TOPROL-XL) 100 mg tablet  Take 100 mg by mouth daily.               lisinopril-hydroCHLOROthiazide (PRINZIDE, ZESTORETIC) 20-12.5 mg per tablet  Take 2 Tabs by mouth daily.               ferrous sulfate (IRON) 325 mg (65 mg iron) EC tablet  Take 325 mg by mouth three (3) times daily (with meals).               traMADol (ULTRAM) 50 mg tablet  Take 50 mg by mouth every six (6) hours as needed for Pain.                     STOP taking these medications                  amLODIPine-atorvastatin (CADUET) 10-40 mg per tablet  Comments:    Reason for Stopping:                      pioglitazone-metFORMIN (ACTOPLUS MET) 15-850 mg per tablet  Comments:    Reason for Stopping:                                Disposition: Home      Consultants:     Cardiology - Dr. Vivi Barrack Course (including pertinent  history  and physical findings)      [name] Juarez is a 81 year old female with a PMH significant for type II diabetes, hypertension and mixed/sensoneureal hearing loss who was admitted to the hospital for sepsis secondary to UTI.       Sepsis 2/2 UTI with 2/4 SIRS and Metabolic Encephalopathy UA on admission was suggestive of UTI with positive leukocyte esterase, positive  nitrites and 3+ bacteria. Lactic acid was elevated at 2.3 and decreased to 1.1 during her admission. WBC 9.3 on admission, decreasing to 6.6 at discharge.The patient was treated with ceftriaxone in the ED and fluids. She received replacement fluids as  an inpatient and zosyn. Urine  culture was positive for Klebsiella Pneumoniae sensitive to levaquin. Levaquin 250 PO Q24H was prescribed for a total of 10 days. The patients daughter noted that in the past week her mother has had some memory issues, during  her hospitalization the patient became confused, often oriented only to person. This confusion is likely secondary to metabolic encephalopathy secondary to her illness. She is at a higher risk for confusion due to her severe hearing loss.       Chest pain The patient endorsed some pressure like chest pain on the day of admission 11/21/2015. Admission EKG indicative of new T wave inversion  in inferolateral leads. Her admission troponin was 0.02 increasing to a maximum of 0.07 with no further chest pain. Inversion remained on EKG 11/22/2015. BNP 828, ECHO indicative of EF 65% with dynamic  obstruction (mid-cavity obliteration) of the  left ventricle. Mild stenosis of the aortic valve. Cardiology was consulted for further management, who recommended aspirin and beta blocker therapy. Home beta blocker continued throughout hospitalization and home aspirin was continued on discharge.         Hypokalemia and Hypomagnesemia  Potassium 3.4 and decreased to 2.7 during admission. The patient was given 80 meq total PO potassium. Potassium increased to 3.6 at  discharge. Magnesium 1.5 on admission, the patient was given 2g IV. Magnesium increased to 1.8 at discharge.      Nausea/vomiting The patient came to the ED complaining of nausea and vomiting, she was not able to keep tolerate any food for the week prior  to admission. Nausea and vomiting improved greatly over admission, and at discharge she was able to eat full meals without an anti-emetic.       Controlled DM 2 Home medications held. Blood sugars controlled with correctional insulin as an inpatient.  Pioglitizone was discontinued due to concern for cardiovascular risk.       Hypertension/Hyperlipidemia Patients BP in the 150's/60's range. Clonidine taper was started as an inpatient due to concern that clonidine  may worsen patient's memory loss. Statin therapy was discontinued as the patient noted weakness and frequent falls on admission.      Hearing loss The patient was evaluated by [facility] audiology 02/2015, and was found to??have moderately-severe to profound mixed hearing loss  in the right ear and a moderate sloping to severe sensorineural hearing loss in the left ear. They were lost to follow up. I gave the patient information to follow up and stressed that poor hearing will predispose the patient to delirium.      Summarized key findings and results (labs, imaging studies, ECHO, cardiac cath, endoscopies, etc):   Left ventricle: Systolic function was hyperdynamic. Ejection fraction was  estimated in the range of 65 % to 70 %. No obvious wall motion  abnormalities identified in the views obtained. There  was dynamic  obstruction, with mid-cavity obliteration.    Right ventricle: Systolic pressure was mildly increased. Estimated peak  pressure was 46 mmHg.    Left atrium: The atrium was mildly dilated.    Aortic valve: The  valve was trileaflet. Leaflets exhibited mildly  increased thickness, mild calcification, and sclerosis. There was mild  stenosis. Valve mean gradient was 18 mmHg.      11/22/2015   Normal  sinus rhythm   ST & T wave abnormality, consider inferolateral ischemia   Abnormal ECG   When compared with ECG of 21-Nov-2015 13:22,   premature atrial complexes are no longer present      11/21/2015  Sinus rhythm with premature atrial complexes   ST & T wave abnormality, consider inferolateral ischemia   Abnormal ECG   When compared with ECG of 21-Nov-2015 09:26,   T wave inversion less evident in Lateral leads       Functional status and cognitive function:     Ambulates with   Status: alert, cooperative, no distress, appears stated age      Diet: Recommend diabetic diet       Code status and advanced care plan: Full      Patient Education:  Patient was educated on the following topics prior to discharge:  delirium, symptoms of UTI in geriatric patients, poor hearing in the elderly and risk for delirium      Follow-up:      Follow-up Information        Follow up With  Details  Comments  Contact Info             [name], MD  On 11/28/2015  @ 10:00  [address]   [place]   [place] [address]   [phone]                      ================================================================   Nelida Meuse, MD

## 2015-11-27 LAB — CULTURE, BLOOD
Culture result:: NO GROWTH
Culture result:: NO GROWTH

## 2015-12-05 ENCOUNTER — Inpatient Hospital Stay
Admit: 2015-12-05 | Discharge: 2015-12-08 | Disposition: A | Discharge status: Home Health | Payer: MEDICARE | Source: Ambulatory Visit | Attending: Family Medicine | Admitting: Family Medicine

## 2015-12-05 ENCOUNTER — Inpatient Hospital Stay: Admit: 2015-12-05 | Payer: MEDICARE | Primary: Internal Medicine

## 2015-12-05 DIAGNOSIS — E860 Dehydration: Secondary | ICD-10-CM

## 2015-12-05 MED ORDER — GLUCOSE 4 GRAM CHEWABLE TAB
4 gram | ORAL | Status: DC | PRN
Start: 2015-12-05 — End: 2015-12-08

## 2015-12-05 MED ORDER — INSULIN LISPRO 100 UNIT/ML INJECTION
100 unit/mL | Freq: Four times a day (QID) | SUBCUTANEOUS | Status: DC
Start: 2015-12-05 — End: 2015-12-08
  Administered 2015-12-06 – 2015-12-08 (×4): via SUBCUTANEOUS

## 2015-12-05 MED ORDER — DEXTROSE 50% IN WATER (D50W) IV SYRG
INTRAVENOUS | Status: DC | PRN
Start: 2015-12-05 — End: 2015-12-08

## 2015-12-05 MED ORDER — AMLODIPINE 10 MG TAB
10 mg | Freq: Every day | ORAL | Status: DC
Start: 2015-12-05 — End: 2015-12-05

## 2015-12-05 MED ORDER — GLUCAGON 1 MG INJECTION
1 mg | INTRAMUSCULAR | Status: DC | PRN
Start: 2015-12-05 — End: 2015-12-08

## 2015-12-05 MED ORDER — PANTOPRAZOLE 40 MG GRANULES FOR ORAL SUSP, DELAYED RELEASE
40 mg | Freq: Every day | ORAL | Status: DC
Start: 2015-12-05 — End: 2015-12-08
  Administered 2015-12-06 – 2015-12-08 (×3): via ORAL

## 2015-12-05 MED ORDER — FERROUS SULFATE 300 MG/5 ML ORAL LIQUID
300 mg (60 mg iron)/5 mL | Freq: Every day | ORAL | Status: DC
Start: 2015-12-05 — End: 2015-12-08
  Administered 2015-12-06 – 2015-12-08 (×3): via ORAL

## 2015-12-05 MED ORDER — LISINOPRIL-HYDROCHLOROTHIAZIDE 20 MG-12.5 MG TAB
Freq: Every day | ORAL | Status: DC
Start: 2015-12-05 — End: 2015-12-05

## 2015-12-05 MED ORDER — SODIUM CHLORIDE 0.9 % IV
INTRAVENOUS | Status: DC
Start: 2015-12-05 — End: 2015-12-08
  Administered 2015-12-05 – 2015-12-08 (×5): via INTRAVENOUS

## 2015-12-05 MED ORDER — METOPROLOL SUCCINATE SR 100 MG 24 HR TAB
100 mg | Freq: Every day | ORAL | Status: DC
Start: 2015-12-05 — End: 2015-12-05

## 2015-12-05 MED FILL — SODIUM CHLORIDE 0.9 % IV: INTRAVENOUS | Qty: 1000

## 2015-12-05 NOTE — H&P (Signed)
Admission History and Physical  [facility]      [name] MRN: [id number]  CSN: [id number]    Date of Birth: [birth year]  Age: 82 y.o.  Sex: female      DOA: 12/05/2015       HPI:     Kynzi Levay is a 82 y.o. female with PMH Type 2 diabetes, HTN, and arthritis, now presenting with altered mental status per PCP.      ED Course: Daughter reported to PCP that patient has been weak, not communicative, not eating, not drinking. Pt's daughter also reports h/o UTI and tarry stools.     Review of Systems  Pt reports that she feels fine, has no pain, no problems eating or drinking, no medical problems, is taking no medications, has not had any problems urinating or having bowel movements, no chest pain, no problems breathing, is eating and drinking well. ROS difficult to obtain d/t pt's lethargy, slurred speech, and trouble with recall.      Past Medical History:   Diagnosis Date   ??? Arthritis    ??? Diabetes (HCC)    ??? Hypertension        Past Surgical History:   Procedure Laterality Date   ??? HX GYN      hysterectomy       No family history on file.    Social History     Social History   ??? Marital status: SINGLE     Spouse name: N/A   ??? Number of children: N/A   ??? Years of education: N/A     Social History Main Topics   ??? Smoking status: Never Smoker   ??? Smokeless tobacco: Never Used   ??? Alcohol use No   ??? Drug use: Not on file   ??? Sexual activity: Not Currently     Other Topics Concern   ??? Not on file     Social History Narrative   ??? No narrative on file       No Known Allergies    Prior to Admission Medications   Prescriptions Last Dose Informant Patient Reported? Taking?   amLODIPine (NORVASC) 10 mg tablet   No No   Sig: Take 1 Tab by mouth daily for 30 days.   ferrous sulfate (IRON) 325 mg (65 mg iron) EC tablet   Yes No   Sig: Take 325 mg by mouth three (3) times daily (with meals).   lisinopril-hydroCHLOROthiazide (PRINZIDE, ZESTORETIC) 20-12.5 mg per tablet   Yes No    Sig: Take 2 Tabs by mouth daily.   metFORMIN (GLUCOPHAGE) 850 mg tablet   No No   Sig: Take 1 Tab by mouth two (2) times a day for 30 days.   metoprolol succinate (TOPROL-XL) 100 mg tablet   Yes No   Sig: Take 100 mg by mouth daily.   traMADol (ULTRAM) 50 mg tablet   Yes No   Sig: Take 50 mg by mouth every six (6) hours as needed for Pain.      Facility-Administered Medications: None       Physical Exam:     No data found.      Physical Exam:   General:  Alert and Responsive and in No acute distress.   HEENT: Right sided facial droop.  CV:  RRR. No murmurs, rubs, or gallops appreciated. No visible pulsations or thrills.    RESP:  Unlabored breathing.  Lungs clear to auscultation. No wheeze, rales, or rhonchi.  Equal expansion bilaterally.    ABD:  Soft, nontender, nondistended. Normoactive bowel sounds.   Neuro:  A+Ox2- place and self.  Ext:  No edema.  2+ radial and dp pulses bilaterally.  Skin:  Skin displayed tenting but cap refill <2 sec  Rectal: Deferred until AM d/t patient's confusion    IMAGING:     No results found for this or any previous visit (from the past 12 hour(s)).      Assessment/Plan:   82 y.o. female with PMH  Type 2 DM, HTN, and arthritis, now admitted with altered mental status, possible sepsis, possible metabolic encephalopathy.    1) Altered mental status- previously admitted for sepsis, 2/4 SIRS criteria w/ UTI- possibly not resolved   - CBC, CMP, UA, Urine Cx ordered   - IV fluids- NS 169mL/hr    2) Type 2 DM   - Metformin  BID at home, held home meds during hospital stay   - Glucose checks AC & HS   - SSI    3) HTN   - BP checks q shift   - Lisinopril/HCTZ  daily   - Metoprolol  daily   - Norvasc  daily    4) Routine care   - Fall risk protocol   - PT consulted    Diet: Diabetic  DVT Prophylaxis: SCDs  Code Status: Full  Point of Contact: Daughter    Disposition and anticipated LOS: 2 midnights    Germain Osgood, MD, PGY-1  [facility]   [facility]                       Senior Resident History and Physical  [facility]    HPI:     [name] Juarez is a 82 y.o. female with PMH of HTN, type 2 DM and hx of UTI, was recently discharged from [facility] hospital for sepsis 2/2 UTI and metabolic encephalopathy. Patient poor historian, history provided by PCP, and doing chart review, attempts made to call patient's daugther, however no answer. Patient was seen by her PCP today at [facility] office and noted to be more fatigued, loss of appetite. history of nausea, vomiting after meals. Last admission patient's urine culture was positive for klebsiella pneumoniae sensitive to levaquin, she was prescribed outpatient to finish abx course which she did. Patient has a unintentional 18lb weight loss in the past 3 months Patient additionally has hearing loss which can contribute to her confusion. Patient's daughter feels that she has not improved from her last visit.     HPI, ROS, PMH, PSH, Family Hx, Social Hx, Home medications, and allergies as above in intern H&P. Which has been reviewed in full. Additional comments to her subjective history include:    Physical Exam:   There were no vitals taken for this visit.    General:  NAD  HEENT:  NCAT.  Conjunctiva pink, sclera anicteric.  Pharynx dry.  Moist mucous dry.  No cervical, supraclavicular, or submandibular lymphadenopathy.  CV:  RRR, no mumurs.  PMI not displaced.  RESP:  Unlabored breathing.  CTAB, no wheeze, rales, or rhonchi.  ABD:  Soft, nontender, nondistended.  Normoactive bowel sounds.    MS:  No joint deformity or instability.  No atrophy.  Neuro:  Sensation grossly intact.  Marland Kitchen  A+Ox2, oriented to self and location  Ext:  No edema.  2+ radial and dp pulses bilaterally.  Skin:  No rashes, lesions, or ulcers.  Marland Kitchen  RECTAL:  See intern physical exam    Labs Reviewed    Assessment/Plan:   82 y.o. female with PMH of UTI, sepsis, HTN, DM type 2 admitted for AMS.     Altered mental status- ddx metabolic encephalopathy may be d/t UTI non resolved, Could also be due to poor PO intake  - will order UA, urine culture  - daily cbc, cmp to assess for electrolyte abnormalities  - will order cxr  - will order blood cultures  - fall precautions in place  - pt/ot     Poor PO intake- could be reason for AMS  - will consult nutrition  - encourage PO intake  - IV fluids started at 23ml/hr of NS    HTN: well controlled  - will hold diuretic htn meds  - monitor vital signs    DM type 2: last A1c- 6.6 7/17  - will hold metformin while in patient  - will put patient on SSI  - accuchecks qachs    Hx of iron def anemia: labs in 2016 showed TIBC 232, fe- 25, iron sat 11%  - daily cbc  - will continue home iron supplementation          Farrel Conners, MD PGY3  12/05/2015, 3:36 PM

## 2015-12-05 NOTE — Progress Notes (Signed)
Bedside SBAR report given to Rebecca RN.

## 2015-12-06 LAB — METABOLIC PANEL, COMPREHENSIVE
A-G Ratio: 1 (ref 0.8–1.7)
A-G Ratio: 1 (ref 0.8–1.7)
ALT (SGPT): 14 U/L (ref 13–56)
ALT (SGPT): 14 U/L (ref 13–56)
AST (SGOT): 10 U/L — ABNORMAL LOW (ref 15–37)
AST (SGOT): 13 U/L — ABNORMAL LOW (ref 15–37)
Albumin: 3.1 g/dL — ABNORMAL LOW (ref 3.4–5.0)
Albumin: 2.9 g/dL — ABNORMAL LOW (ref 3.4–5.0)
Alk. phosphatase: 47 U/L (ref 45–117)
Alk. phosphatase: 43 U/L — ABNORMAL LOW (ref 45–117)
Anion gap: 10 mmol/L (ref 3.0–18)
Anion gap: 9 mmol/L (ref 3.0–18)
BUN/Creatinine ratio: 57 — ABNORMAL HIGH (ref 12–20)
BUN/Creatinine ratio: 58 — ABNORMAL HIGH (ref 12–20)
BUN: 65 MG/DL — ABNORMAL HIGH (ref 7.0–18)
BUN: 54 MG/DL — ABNORMAL HIGH (ref 7.0–18)
Bilirubin, total: 0.6 MG/DL (ref 0.2–1.0)
Bilirubin, total: 0.6 MG/DL (ref 0.2–1.0)
CO2: 31 mmol/L (ref 21–32)
CO2: 28 mmol/L (ref 21–32)
Calcium: 8.8 MG/DL (ref 8.5–10.1)
Calcium: 8.4 MG/DL — ABNORMAL LOW (ref 8.5–10.1)
Chloride: 93 mmol/L — ABNORMAL LOW (ref 100–108)
Chloride: 98 mmol/L — ABNORMAL LOW (ref 100–108)
Creatinine: 1.15 MG/DL (ref 0.6–1.3)
Creatinine: 0.93 MG/DL (ref 0.6–1.3)
GFR est AA: 54 mL/min/{1.73_m2} — ABNORMAL LOW (ref >60)
GFR est AA: >60 mL/min/{1.73_m2} (ref >60)
GFR est non-AA: 44 mL/min/{1.73_m2} — ABNORMAL LOW (ref >60)
GFR est non-AA: 57 mL/min/{1.73_m2} — ABNORMAL LOW (ref >60)
Globulin: 3.1 g/dL (ref 2.0–4.0)
Globulin: 3 g/dL (ref 2.0–4.0)
Glucose: 190 mg/dL — ABNORMAL HIGH (ref 74–99)
Glucose: 105 mg/dL — ABNORMAL HIGH (ref 74–99)
Potassium: 3.1 mmol/L — ABNORMAL LOW (ref 3.5–5.5)
Potassium: 3 mmol/L — ABNORMAL LOW (ref 3.5–5.5)
Protein, total: 6.2 g/dL — ABNORMAL LOW (ref 6.4–8.2)
Protein, total: 5.9 g/dL — ABNORMAL LOW (ref 6.4–8.2)
Sodium: 134 mmol/L — ABNORMAL LOW (ref 136–145)
Sodium: 135 mmol/L — ABNORMAL LOW (ref 136–145)

## 2015-12-06 LAB — URINE MICROSCOPIC ONLY: WBC: 2 /hpf (ref 0–4)

## 2015-12-06 LAB — CBC WITH AUTOMATED DIFF
ABS. BASOPHILS: 0 10*3/uL (ref 0.0–0.06)
ABS. BASOPHILS: 0 10*3/uL (ref 0.0–0.1)
ABS. EOSINOPHILS: 0 10*3/uL (ref 0.0–0.4)
ABS. EOSINOPHILS: 0 10*3/uL (ref 0.0–0.4)
ABS. LYMPHOCYTES: 0.7 10*3/uL — ABNORMAL LOW (ref 0.9–3.6)
ABS. LYMPHOCYTES: 1.1 10*3/uL (ref 0.9–3.6)
ABS. MONOCYTES: 0.7 10*3/uL (ref 0.05–1.2)
ABS. MONOCYTES: 0.8 10*3/uL (ref 0.05–1.2)
ABS. NEUTROPHILS: 5.4 10*3/uL (ref 1.8–8.0)
ABS. NEUTROPHILS: 5.9 10*3/uL (ref 1.8–8.0)
BASOPHILS: 0 % (ref 0–2)
BASOPHILS: 0 % (ref 0–2)
EOSINOPHILS: 0 % (ref 0–5)
EOSINOPHILS: 1 % (ref 0–5)
HCT: 30.7 % — ABNORMAL LOW (ref 35.0–45.0)
HCT: 31.9 % — ABNORMAL LOW (ref 35.0–45.0)
HGB: 10.3 g/dL — ABNORMAL LOW (ref 12.0–16.0)
HGB: 9.9 g/dL — ABNORMAL LOW (ref 12.0–16.0)
LYMPHOCYTES: 10 % — ABNORMAL LOW (ref 21–52)
LYMPHOCYTES: 14 % — ABNORMAL LOW (ref 21–52)
MCH: 24.3 PG (ref 24.0–34.0)
MCH: 24.8 PG (ref 24.0–34.0)
MCHC: 32.2 g/dL (ref 31.0–37.0)
MCHC: 32.3 g/dL (ref 31.0–37.0)
MCV: 75.2 FL (ref 74.0–97.0)
MCV: 76.8 FL (ref 74.0–97.0)
MONOCYTES: 10 % (ref 3–10)
MONOCYTES: 11 % — ABNORMAL HIGH (ref 3–10)
MPV: 9.4 FL (ref 9.2–11.8)
MPV: 9.8 FL (ref 9.2–11.8)
NEUTROPHILS: 75 % — ABNORMAL HIGH (ref 40–73)
NEUTROPHILS: 79 % — ABNORMAL HIGH (ref 40–73)
PLATELET: 248 10*3/uL (ref 135–420)
PLATELET: 299 10*3/uL (ref 135–420)
RBC: 4 M/uL — ABNORMAL LOW (ref 4.20–5.30)
RBC: 4.24 M/uL (ref 4.20–5.30)
RDW: 14.4 % (ref 11.6–14.5)
RDW: 14.4 % (ref 11.6–14.5)
WBC: 6.8 10*3/uL (ref 4.6–13.2)
WBC: 7.9 10*3/uL (ref 4.6–13.2)

## 2015-12-06 LAB — URINALYSIS W/ RFLX MICROSCOPIC
Bilirubin: NEGATIVE
Blood: NEGATIVE
Glucose: NEGATIVE mg/dL
Ketone: NEGATIVE mg/dL
Nitrites: NEGATIVE
Protein: NEGATIVE mg/dL
Specific gravity: 1.011 (ref 1.005–1.030)
Urobilinogen: 0.2 EU/dL (ref 0.2–1.0)
pH (UA): 5.5 (ref 5.0–8.0)

## 2015-12-06 LAB — MAGNESIUM: Magnesium: 1.7 mg/dL (ref 1.6–2.6)

## 2015-12-06 LAB — GLUCOSE, POC
Glucose (POC): 137 mg/dL — ABNORMAL HIGH (ref 70–110)
Glucose (POC): 107 mg/dL (ref 70–110)
Glucose (POC): 173 mg/dL — ABNORMAL HIGH (ref 70–110)
Glucose (POC): 98 mg/dL (ref 70–110)

## 2015-12-06 MED ORDER — SODIUM CHLORIDE 0.9 % IV PIGGY BACK
2.25 gram | Freq: Four times a day (QID) | INTRAVENOUS | Status: DC
Start: 2015-12-06 — End: 2015-12-07
  Administered 2015-12-06 – 2015-12-07 (×4): via INTRAVENOUS

## 2015-12-06 MED ORDER — POTASSIUM CHLORIDE 20 MEQ ORAL PACKET FOR SOLUTION
20 mEq | ORAL | Status: AC
Start: 2015-12-06 — End: 2015-12-06
  Administered 2015-12-06: 12:00:00 via ORAL

## 2015-12-06 MED FILL — PIPERACILLIN-TAZOBACTAM 2.25 GRAM IV SOLR: 2.25 gram | INTRAVENOUS | Qty: 2.25

## 2015-12-06 MED FILL — PROTONIX 40 MG GRANULES DELAYED-RELEASE PACKET: 40 mg | ORAL | Qty: 1

## 2015-12-06 MED FILL — FERROUS SULFATE 300 MG/5 ML ORAL LIQUID: 300 mg (60 mg iron)/5 mL | ORAL | Qty: 5

## 2015-12-06 MED FILL — INSULIN LISPRO 100 UNIT/ML INJECTION: 100 unit/mL | SUBCUTANEOUS | Qty: 1

## 2015-12-06 MED FILL — POTASSIUM CHLORIDE 20 MEQ ORAL PACKET FOR SOLUTION: 20 mEq | ORAL | Qty: 2

## 2015-12-06 NOTE — Progress Notes (Signed)
Addendum to today's progress note   ??  It is not safe to discharge the patient home at this time, Ms. Labean has cognitive limitation (Dementia)     Patient was admitted d/t dehydration, has been on continuous  IV fluids. UA showed UTI, will start her on abx broad spectrum until urine cultures come back.     ??  Estimated length of stay after tonight: 1-3 midnights  Post hospital care: Long term placement (in progress)  ??  Germain Osgood, MD, PGY-1  [facility]  [facility]

## 2015-12-06 NOTE — Progress Notes (Addendum)
Problem: Self Care Deficits Care Plan (Adult)  Goal: *Acute Goals and Plan of Care (Insert Text)  OCCUPATIONAL THERAPY EVALUATION/DISCHARGE     [name] (82 y.o. female)  Date: 12/06/2015  Primary Diagnosis: Dehydration  Altered mental status  Precautions: fall         ASSESSMENT AND RECOMMENDATIONS:  Based on the objective data described below, the patient presents with generalized weakness that limits her efficiency with her functional mobility. She is modified independent for her bathing and dressing routines including simulations (additional time and effort). She requires CG for her functional mobility related to her self care task completion. Will defer her functional mobility to PT; therefore, further skilled occupational therapy is not indicated at this time.  Discharge Recommendations: 24 hour supervision and home health vs. SNF depending on her family's ability to assist her  Further Equipment Recommendations for Discharge: N/A       COMPLEXITY      Eval Complexity: History: LOW Complexity : Brief history review ; Examination: LOW Complexity : 1-3 performance deficits relating to physical, cognitive , or psychosocial skils that result in activity limitations and / or participation restrictions ; Decision Making:LOW Complexity : No comorbidities that affect functional and no verbal or physical assistance needed to complete eval tasks  Assessment: LOW Complexity          G-CODES:      Self Care [id number] Current  CI= 1-19%  G8988 Goal  CI= 1-19%  G8989 D/C  CI= 1-19%.  The severity rating is based on the Level of Assistance required for Functional Mobility and ADLs.      SUBJECTIVE:   Patient stated ???My daughter works so during the day the dog is with me.???      OBJECTIVE DATA SUMMARY:       Past Medical History:   Diagnosis Date   ??? Arthritis     ??? Diabetes (HCC)     ??? Hypertension       Past Surgical History:   Procedure Laterality Date   ??? HX GYN         hysterectomy      Prior Level of Function/Home Situation: she reports living with her daughter and that she was ab le to get her self washed and dressed  Home Situation  Home Environment: Private residence  Living Alone: No  Support Systems: Family member(s)  Current DME Used/Available at Home: Environmental consultant, rolling  [X]      Right hand dominant       [ ]      Left hand dominant  Cognitive/Behavioral Status:  Neurologic State: Alert  She is oriented to self and place; grossly to time  Skin: no skin integrity issues noted during OT evaluation  Edema: no extremity edema noted   Vision/Perceptual:     tracking is [facility]     Coordination:   BUE's WFL   Balance:  Independent sitting during LB dressing  CG and occasional min assist standing during simulated LB clothes management  Strength:  BUE's: 4-/5  Tone & Sensation:  BUE's WFL   Range of Motion:  BUE's AROM is [facility]  Functional Mobility and Transfers for ADLs:  Bed Mobility: per PT as she is up in the chair upon arrival  Supine to Sit: Supervision (x 2 reps)  Sit to Supine: Minimum assistance  Transfers:  Sit to stand required CG  ADL Assessment:   self feeding: independent (simulated)  Grooming: independent at chair level (simulated)  UB bathing/dressing: Independent (  simulated)  LB bathing/dressing: Modified independent (additional time and effort) she reports she sometimes needs help when she is tired (she reports)  Pain:  Pt reports 0/10 pain or discomfort prior to treatment.    Pt reports 0/10 pain or discomfort post treatment.   Activity Tolerance:   No SOB noted although she does report generalized fatigue   Please refer to the flowsheet for vital signs taken during this treatment.  After treatment:     Patient left in no apparent distress sitting up in chair    Patient left in no apparent distress in bed    Call bell left within reach    Nursing notified    Caregiver present    Bed alarm activated      COMMUNICATION/EDUCATION:   Communication/Collaboration:         Home safety education was provided and the patient/caregiver indicated understanding.        Patient/family have participated as able and agree with findings and recommendations.        Patient is unable to participate in plan of care at this time.     Lori Critser, OTR/L   total time: 10 minutes

## 2015-12-06 NOTE — Discharge Summary (Signed)
Discharge Summary  [facility]      [name] Age: 82 y.o.  Sex: female  DOB: 1921/06/26    MRN: [id number]      DOA: 12/05/2015      Discharge Date: 12/08/2015      Attending:Margaret Kasandra Knudsen, MD      PCP: Pershing Proud, MD        ================================================================    Reason for Admission:   Dehydration  Altered mental status    Discharge Diagnoses:   Altered mental status  Type 2 Diabetes Mellitus  Hypertension  Dementia      Important notes to PCP/ follow-up studies and evaluations   All blood pressure meds held during admission and patient remained normotensive without meds. Please reevaluate blood pressure outpatient and restart meds as needed.  Pending labs and studies:  None   Operative Procedures:   None    Discharge Medications:     Current Discharge Medication List      CONTINUE these medications which have NOT CHANGED    Details   amLODIPine (NORVASC) 10 mg tablet Take 1 Tab by mouth daily for 30 days.  Qty: 30 Tab, Refills: 0      metFORMIN (GLUCOPHAGE) 850 mg tablet Take 1 Tab by mouth two (2) times a day for 30 days.  Qty: 60 Tab, Refills: 0      metoprolol succinate (TOPROL-XL) 100 mg tablet Take 100 mg by mouth daily.      lisinopril-hydroCHLOROthiazide (PRINZIDE, ZESTORETIC) 20-12.5 mg per tablet Take 2 Tabs by mouth daily.      ferrous sulfate (IRON) 325 mg (65 mg iron) EC tablet Take 325 mg by mouth three (3) times daily (with meals).      traMADol (ULTRAM) 50 mg tablet Take 50 mg by mouth every six (6) hours as needed for Pain.             Disposition: Home with Home Health    Consultants:    None    Brief Hospital Course (including pertinent history and physical findings)  Pt was seen in clinic by Dr. Harlon Ditty with daughter, who reported that patient has not been eating or drinking, had not been talking, and was not at her baseline. Pt was admitted with dehydration and altered mental  status. Pt had h/o UTI and was treated, but was suspicious for toxic metabolic encephalopathy 2/2 urosepsis. Pt was given IV fluids, Zosyn until cultures returned negative, and physical therapy. Patient to go home with home health for long term care.     Summarized key findings and results (labs, imaging studies, ECHO, cardiac cath, endoscopies, etc):  Blood cx: NGTD  Urine Cx: NGTD  UA: Trace LE, few bacteria  CXR: No focal consolidation at lung bases to suggest aspiration. COPD    Functional status and cognitive function:    Ambulates with walker, assisted  Status: alert, cooperative, no distress, appears stated age, oriented x2, not to date or year    Diet:  Diabetic    Code status and advanced care plan: Full  Power Of [facility] of Contact:    Patient Education:      Follow-up:   Follow-up Information     Follow up With Details Comments Contact Info    Pershing Proud, MD On 12/12/2015 :Virgina Evener [address]  [address]  [place] [address]  [phone]              ================================================================  Germain Osgood, MD, PGY-1  [place]  [facility]  12/08/15 9:18 AM

## 2015-12-06 NOTE — Other (Signed)
Bedside and Verbal shift change report given to Adeline, RN (oncoming nurse) by Littie Deeds, RN (offgoing nurse). Report included the following information SBAR, Kardex, MAR and Recent Results.    SITUATION:  Code Status: Full Code  Reason for Admission: Dehydration  Altered mental status  Hospital day: 1  Problem List:       Hospital Problems  Never Reviewed          Codes Class Noted POA    * (Principal)Altered mental status ICD-10-CM: R41.82  ICD-9-CM: 780.97  12/05/2015 Unknown              BACKGROUND:   Past Medical History:   Past Medical History:   Diagnosis Date   ??? Arthritis    ??? Diabetes (HCC)    ??? Hypertension       Patient taking anticoagulants no    Patient has a defibrillator: no    History of shots YES for example, flu, pneumonia, tetanus   Isolation History NO for example, MRSA, CDiff    ASSESSMENT:  Changes in Assessment Throughout Shift: None  Significant Changes in 24 hours (for example, RR/code, fall)  Patient has Central Line: no Reasons if yes: N/A  Patient has Foley Cath: no Reasons if yes: N/A   Mobility Issues  PT  IV Patency  OR Checklist  Pending Tests    Last Vitals:  Vitals w/ MEWS Score (last day)     Date/Time MEWS Score Pulse Resp Temp BP Level of Consciousness SpO2    12/06/15 1600 1 83 16 97.1 ??F (36.2 ??C) 122/53 Alert 100 %    12/06/15 1246 1 79 16 98.3 ??F (36.8 ??C) 112/67 Alert 98 %    12/06/15 0400 1 82 16 97.2 ??F (36.2 ??C) 132/67 Alert 98 %    12/05/15 2000 1 84 16 98 ??F (36.7 ??C) 133/54 Alert 98 %    12/05/15 1640 1 86 17 98 ??F (36.7 ??C) 133/52 Alert 98 %            PAIN    Pain Assessment    Pain Intensity 1: 0 (12/06/15 0400)              Patient Stated Pain Goal: 0  Intervention effective: N/A  Time of last intervention: N/A Reassessment Completed: N/A  Other actions taken for pain: N/A    Last 3 Weights:  Last 3 Recorded Weights in this Encounter    12/06/15 1554   Weight: 38.1 kg (84 lb 1.6 oz)   Weight change:     INTAKE/OUPUT     Current Shift: 08/02 1901 - 08/03 0700  In: 217 [I.V.:217]  Out: -     Last three shifts: 08/01 0701 - 08/02 1900  In: 849 [P.O.:260; I.V.:589]  Out: 830 [Urine:830]    RECOMMENDATIONS AND DISCHARGE PLANNING  Patient needs and requests: None    Pending tests/procedures: None     Discharge plan for patient: TBD    Discharge planning Needs or Barriers: TBD    Estimated Discharge Date: TBD Posted on Whiteboard in Patient???s Room: no       "HEALS" SAFETY CHECK  A safety check occurred in the patient's room between off going nurse and oncoming nurse listed above.    The safety check included the below items:    H  High Alert Medications Verify all high alert medication drips (heparin, PCA, etc.)  E  Equipment Suction is set up for ALL patients (with yanker)  Red plugs  utilized for all equipment (IV pumps, etc.)  WOW???s wiped down at end of shift.  Room stocked with oxygen, suction, and other unit-specific supplies  A  Alarms Bed alarm is set for fall risk patients  Ensure chair alarm is in place and activated if patient is up in a chair  L  Lines Check IV for any infiltration  Foley bag is empty if patient has a Foley   Tubing and IV bags are labeled  S  Safety  Room is clean, patient is clean, and equipment is clean.  Hallways are clear from equipment besides carts.   Fall bracelet on for fall risk patients  Ensure room is clear and free of clutter  Suction is set up for ALL patients (with yanker)  Hallways are clear from equipment besides carts.   Isolation precautions followed, supplies available outside room, sign posted    Littie Deeds, RN

## 2015-12-06 NOTE — Progress Notes (Signed)
Problem: Mobility Impaired (Adult and Pediatric)  Goal: *Acute Goals and Plan of Care (Insert Text)  STG for 8.2.17 to be completed by 8.9.17 (7 days).  1. Pt will complete supine to/from sit (I) in order to complete EOB activity.  2. Pt will complete sit to/from stand transfer with RW and S in prep for ambulation.  3. Pt will ambulate 50 feet with RW and S in order to negotiate household distances.   Outcome: Progressing Towards Goal  PHYSICAL THERAPY EVALUATION     [name] (82 y.o. female)  Date: 12/06/2015  Primary Diagnosis: Dehydration  Altered mental status  Precautions: Wall         ASSESSMENT : 82 yo female presents with impaired functional mobility, decreased activity tolerance, generalized weakness, and ambulation dysfunction s/p altered mental status. CNA in room to give bath, reported she would be unable to come back. PT notified CNA that PT evaluation could be quick, CNA left linens and stated she had other patients to see. Pt educated on role of therapy during acute stay, verbalized understanding. Pt very cooperative, completed supine to sit with SBA, no complaints of dizziness at EOB. Pt initially completed first stand with RW and min A, unable to follow VCs for proper hand placement to maximize safety. Pt completed sidesteps up towards HOB and returned supine while therapist searched for recliner chair. Pt eager to get OOB to chair, sat up in bed, ambulated 5 ft to chair with standard walker. Spoke to Runner, broadcasting/film/video in regards to mobility, Runner, broadcasting/film/video stated pt has been getting up to bathroom as well. Pt sitting up in chair at end of session, call bell in reach. PT changed linens to assist with maximizing patient's care during hospital stay. Recommend continued therapy during acute stay.     Eval Complexity: History: MEDIUM  Complexity : 1-2 comorbidities / personal factors will impact the outcome/ POC Exam:LOW Complexity : 1-2  Standardized tests and measures addressing body structure, function, activity limitation and / or participation in recreation  Presentation: LOW Complexity : Stable, uncomplicated  Clinical Decision Making:Low Complexity elderly, high PLOF, questionable historian Overall Complexity:LOW      Recommendations for nursing: up in chair 3x/day, up with walker and 1 person assist, ambulate to bathroom  Verbally communicated to: Student RN, patient     Patient will benefit from skilled intervention to address the above impairments.  Patient???s rehabilitation potential is considered to be Fair  Factors which may influence rehabilitation potential include:   [ ]          None noted  [X]          Mental ability/status  [X]          Medical condition  [X]          Home/family situation and support systems  [X]          Safety awareness  [ ]          Pain tolerance/management  [ ]          Other:        PLAN :Recommend continued therapy during acute stay.  Recommendations and Planned Interventions:  [X]            Bed Mobility Training             [X]     Neuromuscular Re-Education  [X]            Transfer Training                   [ ]   Orthotic/Prosthetic Training  [X]            Gait Training                          [ ]     Modalities  [X]            Therapeutic Exercises          [ ]     Edema Management/Control  [X]            Therapeutic Activities            [X]     Patient and Family Training/Education  [ ]            Other (comment):     Frequency/Duration: Patient will be followed by physical therapy 3-5 times a week to address goals.  Discharge Recommendations: Home Health with 24 hour supervision vs Skilled Nursing Facility  Further Equipment Recommendations for Discharge: bedside commode and rolling walker       SUBJECTIVE:   Patient stated ???Okay.???      OBJECTIVE DATA SUMMARY:       Past Medical History:   Diagnosis Date   ??? Arthritis     ??? Diabetes (HCC)     ??? Hypertension       Past Surgical History:    Procedure Laterality Date   ??? HX GYN         hysterectomy     Barriers to Learning/Limitations: yes;  altered mental status (i.e.Sedation, Confusion)  Compensate with: visual, verbal, tactile, kinesthetic cues/model  GCODES(GP):Mobility B3794 Current  CK= 40-59%  G8979 Goal  CI= 1-19%.  The severity rating is based on the [facility] Balance Scale  0: Pt performs 25% or less of standing activity (Max assist) CN, 100% impaired.  1: Pt supports self with upper extremities but requires therapist assistance. Pt performs 25-50% of effort (Mod assist) CM, 80% to <100% impaired.  1+: Pt supports self with upper extremities but requires therapist assistance. Pt performs >50% effort. (Min assist). CL, 60% to <80% impaired.  2: Pt supports self independently with both upper extremities (walker, crutches, parallel bars). CL, 60% to <80% impaired.  2+: Pt support self independently with 1 upper extremity (cane, crutch, 1 parallel bar). CK, 40% to <60% impaired.  3: Pt stands without upper extremity support for up to 30 seconds. CK, 40% to <60% impaired.  3+: Pt stands without upper extremity support for 30 seconds or greater. CJ, 20% to <40% impaired.  4: Pt independently moves and returns center of gravity 1-2 inches in one plane. CJ, 20% to <40% impaired.  4+: Pt independently moves and returns center of gravity 1-2 inches in multiple planes. CI, 1% to <20% impaired.  5: Pt independently moves and returns center of gravity in all planes greater than 2 inches. CH, 0% impaired.  Prior Level of Function/Home Situation: Pt is questionable historian, recommend follow up with family.  Home Situation  Home Environment: Private residence  Living Alone: No  Support Systems: Family member(s)  Current DME Used/Available at Home: Walker, rolling  Critical Behavior:  Neurologic State: Alert  Orientation Level: Oriented to person;Oriented to place;Appropriate for age  Cognition: Follows commands  Psychosocial   Patient Behaviors: Calm;Cooperative  Purposeful Interaction: Yes  Strength:    Strength: Generally decreased, functional  Range Of Motion:  AROM: Generally decreased, functional   PROM: Within functional limits  Functional Mobility:  Bed Mobility:  Supine to Sit: Supervision (x  2 reps)  Sit to Supine: Minimum assistance  Transfers:  Sit to Stand: Minimum assistance;Assist x1 (x 2 reps)  Stand to Sit: Contact guard assistance  Balance:   Sitting: Impaired  Sitting - Static: Good (unsupported)  Sitting - Dynamic: Good (unsupported)  Standing: Impaired  Standing - Static: Good  Standing - Dynamic : Fair  Ambulation/Gait Training:  Distance (ft): 5 Feet (ft)  Assistive Device: Walker  Ambulation - Level of Assistance: Contact guard assistance  Gait Description (WDL): Exceptions to WDL  Gait Abnormalities: Decreased step clearance;Step to gait  Base of Support: Center of gravity altered  Speed/Cadence: Slow;Shuffled;Pace decreased (<100 feet/min)  Step Length: Right shortened;Left shortened  Pain:  Pre-treatment level: 0  Location: n/a  Post-treatment level: 0  Activity Tolerance:   Pt reported no symptomatic complaints throughout session.  Please refer to the flowsheet for vital signs taken during this treatment.  After treatment:            Patient left in no apparent distress sitting up in chair           Patient left in no apparent distress in bed           Call bell left within reach           Nursing notified           Caregiver present           Bed alarm activated      COMMUNICATION/EDUCATION:            Fall prevention education was provided and the patient/caregiver indicated understanding.           Patient/family have participated as able in goal setting and plan of care.           Patient/family agree to work toward stated goals and plan of care.           Patient understands intent and goals of therapy, but is neutral about his/her participation.            Patient is unable to participate in goal setting and plan of care.     Thank you for this referral.  Luz Brazen PT, DPT   Time Calculation: 23 mins

## 2015-12-06 NOTE — Progress Notes (Signed)
NUTRITION    Nutrition Consult: General Nutrition Management & Supplements      RECOMMENDATIONS / PLAN:     - Add supplement: Glucerna Shake BID, Ensure Pudding once daily  - Downgrade diet consistency to soft solids  - Continue RD inpatient monitoring and evaluation.     NUTRITION INTERVENTIONS & DIAGNOSIS:      Meals/Snacks: modified diet   Medical food supplementation: add     Nutrition Diagnosis:  Underweight related to inadequate energy intake as evidenced by BMI 16.4 kg/m^2  Chewing difficulty related to pt not having her teeth as evidenced by pt avoiding to eat per H&P    ASSESSMENT:     Pt lethargic at time of visit. Per chart review, pt experienced unplanned weight loss PTA, was not able to tolerate last night's dinner due to not having her teeth, and has has decreased appetite and meal intake PTA. Pt underweight; BMI 16.4 kg/m^2    Average po intake adequate to meet patients estimated nutritional needs:    Yes      No    Unable to determine at this time    Diet: DIET DIABETIC CONSISTENT CARB Regular      Food Allergies:  None known   Current Appetite:    Good      Fair      Poor      Other: unknown   Appetite/meal intake prior to admission:    Good      Fair      Poor (per nursing screen)      Other:  Feeding Limitations:   Swallowing difficulty     Chewing difficulty: pt did not have her teeth per H&P     Other:  Current Meal Intake: No data found.      BM:  7/30  Skin Integrity:  No pressure ulcer or wound noted  Edema:  None   Pertinent Medications: Reviewed    Recent Labs      12/06/15   1145  12/06/15   0433  12/05/15   1919   NA   --   135*  134*   K   --   3.0*  3.1*   CL   --   98*  93*   CO2   --   28  31   GLU   --   105*  190*   BUN   --   54*  65*   CREA   --   0.93  1.15   CA   --   8.4*  8.8   MG  1.7   --    --    ALB   --   2.9*  3.1*   SGOT   --   13*  10*   ALT   --   14  14     No intake or output data in the 24 hours ending 12/06/15 1555    Anthropometrics:   Ht Readings from Last 1 Encounters:   12/06/15 5' (1.524 m)     Last 3 Recorded Weights in this Encounter    12/06/15 1554   Weight: 38.1 kg (84 lb 1.6 oz)     Body mass index is 16.42 kg/(m^2).    Underweight      Weight History:  Per H&P, pt had unplanned weight loss of 18 lb in past 3 months PTA    Weight Metrics 12/06/2015 11/22/2015 11/21/2015   Weight 84 lb 1.6 oz 88  lb -   BMI 16.42 kg/m2 - 17.19 kg/m2        Admitting Diagnosis: Dehydration  Altered mental status  Pertinent PMHx:  DM, HTN    Education Needs:         None identified   Identified - Not appropriate at this time    Identified and addressed - refer to education log  Learning Limitations:    None identified   Identified    Cultural, religious & ethnic food preferences:   None identified     Identified and addressed     ESTIMATED NUTRITION NEEDS:     Additional 500 kcal daily for promotion of weight gain  Calories: 1360-1431 kcal (MSJx1.2-1.3) based on   Actual BW: 38 kg      IBW   Protein: 30-42 gm (0.8-1.1 gm/kg) based on   Actual BW       IBW   Fluid: 1 mL/kcal     MONITORING & EVALUATION:     Nutrition Goal(s):   1. Po intake of meals will meet >75% of patient estimated nutritional needs within the next 7 days.  Outcome:   Met/Ongoing      Not Met     New/Initial Goal     Monitoring:    Diet tolerance    Meal intake    Supplement intake    GI symptoms/ability to tolerate po diet    Respiratory status    Plan of care      Previous Recommendations (for follow-up assessments only):        Implemented          Not Implemented (RD to address)      No Recommendation Made     Discharge Planning:  Regular diet; consistency as tolerates    Participated in care planning, discharge planning, & interdisciplinary rounds as appropriate      Coralee North, [place]   Pager: [phone]

## 2015-12-06 NOTE — Progress Notes (Addendum)
Intern Progress Note  [facility]       [name] MRN: [id number]  CSN: [id number]    Date of Birth: 1921-06-17  Age: 82 y.o.  Sex: female    DOA: 12/05/2015 LOS:  LOS: 1 day                    Subjective:     Acute events: No acute events overnight. Pt is resting comfortably. She still has a lot of confusion and has trouble communicating. She could not remember the year she was born, where she lives, or who the President is. She has no complaints otherwise. Per daughter, she has not been eating or drinking and has not had a bowel movement in 3 days. She is able to ambulate to bathroom with a walker, but per nurse, would do better with a wheeled walker or one with tennis balls so she does not have to lift it.     Review of Systems   Constitutional: Negative for chills and fever.   Respiratory: Negative for shortness of breath.    Cardiovascular: Negative for chest pain and palpitations.   Gastrointestinal: Negative for nausea and vomiting.   Genitourinary: Negative for dysuria.   Neurological: Negative for dizziness and weakness.       Objective:      Patient Vitals for the past 24 hrs:   Temp Pulse Resp BP SpO2   12/06/15 0400 97.2 ??F (36.2 ??C) 82 16 132/67 98 %   12/05/15 2000 98 ??F (36.7 ??C) 84 16 133/54 98 %   12/05/15 1640 98 ??F (36.7 ??C) 86 17 133/52 98 %       No intake or output data in the 24 hours ending 12/06/15 0947    Physical Exam   Cardiovascular: Normal rate, regular rhythm and normal heart sounds.    Pulmonary/Chest: Effort normal and breath sounds normal.   Abdominal: Soft. Bowel sounds are normal.   Skin: Skin is warm and dry.       Lab/Data Reviewed:  Recent Results (from the past 24 hour(s))   METABOLIC PANEL, COMPREHENSIVE    Collection Time: 12/05/15  7:19 PM   Result Value Ref Range    Sodium 134 (L) 136 - 145 mmol/L    Potassium 3.1 (L) 3.5 - 5.5 mmol/L    Chloride 93 (L) 100 - 108 mmol/L    CO2 31 21 - 32 mmol/L    Anion gap 10 3.0 - 18 mmol/L     Glucose 190 (H) 74 - 99 mg/dL    BUN 65 (H) 7.0 - 18 MG/DL    Creatinine 1.15 0.6 - 1.3 MG/DL    BUN/Creatinine ratio 57 (H) 12 - 20      GFR est AA 54 (L) >60 ml/min/1.60m    GFR est non-AA 44 (L) >60 ml/min/1.771m   Calcium 8.8 8.5 - 10.1 MG/DL    Bilirubin, total 0.6 0.2 - 1.0 MG/DL    ALT (SGPT) 14 13 - 56 U/L    AST (SGOT) 10 (L) 15 - 37 U/L    Alk. phosphatase 47 45 - 117 U/L    Protein, total 6.2 (L) 6.4 - 8.2 g/dL    Albumin 3.1 (L) 3.4 - 5.0 g/dL    Globulin 3.1 2.0 - 4.0 g/dL    A-G Ratio 1.0 0.8 - 1.7     CBC WITH AUTOMATED DIFF    Collection Time: 12/05/15  7:19 PM   Result  Value Ref Range    WBC 7.9 4.6 - 13.2 K/uL    RBC 4.24 4.20 - 5.30 M/uL    HGB 10.3 (L) 12.0 - 16.0 g/dL    HCT 31.9 (L) 35.0 - 45.0 %    MCV 75.2 74.0 - 97.0 FL    MCH 24.3 24.0 - 34.0 PG    MCHC 32.3 31.0 - 37.0 g/dL    RDW 14.4 11.6 - 14.5 %    PLATELET 299 135 - 420 K/uL    MPV 9.8 9.2 - 11.8 FL    NEUTROPHILS 75 (H) 40 - 73 %    LYMPHOCYTES 14 (L) 21 - 52 %    MONOCYTES 10 3 - 10 %    EOSINOPHILS 1 0 - 5 %    BASOPHILS 0 0 - 2 %    ABS. NEUTROPHILS 5.9 1.8 - 8.0 K/UL    ABS. LYMPHOCYTES 1.1 0.9 - 3.6 K/UL    ABS. MONOCYTES 0.8 0.05 - 1.2 K/UL    ABS. EOSINOPHILS 0.0 0.0 - 0.4 K/UL    ABS. BASOPHILS 0.0 0.0 - 0.1 K/UL    DF AUTOMATED     CULTURE, BLOOD    Collection Time: 12/05/15  9:30 PM   Result Value Ref Range    Special Requests: NO SPECIAL REQUESTS      Culture result: NO GROWTH AFTER 10 HOURS     GLUCOSE, POC    Collection Time: 12/06/15 12:39 AM   Result Value Ref Range    Glucose (POC) 137 (H) 70 - 034 mg/dL   METABOLIC PANEL, COMPREHENSIVE    Collection Time: 12/06/15  4:33 AM   Result Value Ref Range    Sodium 135 (L) 136 - 145 mmol/L    Potassium 3.0 (L) 3.5 - 5.5 mmol/L    Chloride 98 (L) 100 - 108 mmol/L    CO2 28 21 - 32 mmol/L    Anion gap 9 3.0 - 18 mmol/L    Glucose 105 (H) 74 - 99 mg/dL    BUN 54 (H) 7.0 - 18 MG/DL    Creatinine 0.93 0.6 - 1.3 MG/DL    BUN/Creatinine ratio 58 (H) 12 - 20       GFR est AA >60 >60 ml/min/1.74m    GFR est non-AA 57 (L) >60 ml/min/1.722m   Calcium 8.4 (L) 8.5 - 10.1 MG/DL    Bilirubin, total 0.6 0.2 - 1.0 MG/DL    ALT (SGPT) 14 13 - 56 U/L    AST (SGOT) 13 (L) 15 - 37 U/L    Alk. phosphatase 43 (L) 45 - 117 U/L    Protein, total 5.9 (L) 6.4 - 8.2 g/dL    Albumin 2.9 (L) 3.4 - 5.0 g/dL    Globulin 3.0 2.0 - 4.0 g/dL    A-G Ratio 1.0 0.8 - 1.7     GLUCOSE, POC    Collection Time: 12/06/15  8:15 AM   Result Value Ref Range    Glucose (POC) 107 70 - 110 mg/dL       Scheduled Medications Reviewed:  Current Facility-Administered Medications   Medication Dose Route Frequency   ??? 0.9% sodium chloride infusion  60 mL/hr IntraVENous CONTINUOUS   ??? ferrous sulfate 300 mg (60 mg iron)/5 mL oral syrup 300 mg  300 mg Oral DAILY   ??? insulin lispro (HUMALOG) injection   SubCUTAneous AC&HS   ??? pantoprazole (PROTONIX) granules for oral suspension 40 mg  40 mg Oral ACB  Imaging, microbiology, and EKG/Telemetry:  CXR: No focal consolidation at lung bases to suggest aspiration. COPD  Blood cx: No growth after 10 hrs    Assessment/Plan     82 y.o. Female with PMH type 2 DM, HTN, and arthritis admitted with Altered mental status (12/05/2015)/toxic/met encephalopathy: r/o infection vs metabolic ( including poss AKI/electolyte abnormality) vs others. No focal findings.  ??  Decreased PO intake- possibly d/t altered mental status: r/o infection, no evidence of dysphagia; K: 3.1>3- 8m given once; Na: 134>135  ??  HTN: currently normotensive, hold meds, continue monitoring BP  ??  DMT2: last HG A1c was 6.6, meds were readjusted at that time. Hold metformin for now with decreased PO intake. Accu check AC&HS and SSI for BG coverage    Routine care: Fall risk protocol, PT/OT consulted  ??    Diet: Diabetic  DVT Prophylaxis: SCDs  Code Status: Full  Point of Contact: Daughter    Disposition: Awaiting placement at SNF    JHeide Spark MD, PGY-1  [facility]

## 2015-12-06 NOTE — Other (Signed)
Bedside and Verbal shift change report given to Myra, RN (oncoming nurse) by Scarlette Shorts, RN (offgoing nurse). Report included the following information SBAR, Kardex, MAR and Recent Results.    SITUATION:  Code Status: Full Code  Reason for Admission: Dehydration  Altered mental status  Hospital day: 1  Problem List:       Hospital Problems  Never Reviewed          Codes Class Noted POA    * (Principal)Altered mental status ICD-10-CM: R41.82  ICD-9-CM: 780.97  12/05/2015 Unknown              BACKGROUND:   Past Medical History:   Past Medical History:   Diagnosis Date   ??? Arthritis    ??? Diabetes (HCC)    ??? Hypertension       Patient taking anticoagulants no    Patient has a defibrillator: no    History of shots YES for example, flu, pneumonia, tetanus   Isolation History NO for example, MRSA, CDiff    ASSESSMENT:  Changes in Assessment Throughout Shift: None  Significant Changes in 24 hours (for example, RR/code, fall)  Patient has Central Line: no Reasons if yes: N/A  Patient has Foley Cath: no Reasons if yes: N/A   Mobility Issues  PT  IV Patency  OR Checklist  Pending Tests    Last Vitals:  Vitals w/ MEWS Score (last day)     Date/Time MEWS Score Pulse Resp Temp BP Level of Consciousness SpO2    12/06/15 0400 1 82 16 97.2 ??F (36.2 ??C) 132/67 Alert 98 %    12/05/15 2000 1 84 16 98 ??F (36.7 ??C) 133/54 Alert 98 %    12/05/15 1640 1 86 17 98 ??F (36.7 ??C) 133/52 Alert 98 %            PAIN    Pain Assessment    Pain Intensity 1: 0 (12/06/15 0400)              Patient Stated Pain Goal: 0  Intervention effective: N/A  Time of last intervention: N/A Reassessment Completed: N/A  Other actions taken for pain: N/A    Last 3 Weights:  There were no vitals filed for this visit.Weight change:     INTAKE/OUPUT    Current Shift:      Last three shifts:      RECOMMENDATIONS AND DISCHARGE PLANNING  Patient needs and requests: None    Pending tests/procedures: None     Discharge plan for patient: TBD     Discharge planning Needs or Barriers: TBD    Estimated Discharge Date: TBD Posted on Whiteboard in Patient???s Room: no       "HEALS" SAFETY CHECK  A safety check occurred in the patient's room between off going nurse and oncoming nurse listed above.    The safety check included the below items:    H  High Alert Medications Verify all high alert medication drips (heparin, PCA, etc.)  E  Equipment Suction is set up for ALL patients (with yanker)  Red plugs utilized for all equipment (IV pumps, etc.)  WOW???s wiped down at end of shift.  Room stocked with oxygen, suction, and other unit-specific supplies  A  Alarms Bed alarm is set for fall risk patients  Ensure chair alarm is in place and activated if patient is up in a chair  L  Lines Check IV for any infiltration  Foley bag is empty if patient has a  Foley   Tubing and IV bags are labeled  S  Safety  Room is clean, patient is clean, and equipment is clean.  Hallways are clear from equipment besides carts.   Fall bracelet on for fall risk patients  Ensure room is clear and free of clutter  Suction is set up for ALL patients (with yanker)  Hallways are clear from equipment besides carts.   Isolation precautions followed, supplies available outside room, sign posted    Scarlette Shorts, RN

## 2015-12-06 NOTE — Other (Signed)
IDR/SLIDR Summary          [name] MRN: [id number]    Age: 82 y.o.     Birthdate: 13-Feb-1922 Room/Bed: 471/01   Admit Diagnosis: Dehydration  Altered mental status  Principal Diagnosis: Altered mental status   Goals: improve ambulation; pt/ot  Readmission: NO  Quality Measure: Not applicable  VTE Prophylaxis: Chemical  Influenza Vaccine screening completed? YES  Pneumococcal Vaccine screening completed? YES  Mobility needs: Yes   Nutrition plan:Yes  Consults:P.T and O.T.    Financial concerns:No  Escalated to CM? YES  RRAT Score: 12   Interventions:Home Health  Testing due for pt today? NO  LOS: 1 days Expected length of stay 3 days  Discharge plan: tbd   PCP: Pershing Proud, MD  Transportation needs: Yes    Days before discharge:two or more days before discharge   Discharge disposition: tbd    Signed:     Littie Deeds, RN  12/06/2015  9:58 AM

## 2015-12-06 NOTE — Discharge Summary (Signed)
Discharge Summary by Joan Flores, MD at 12/06/15 2024                Author: Joan Flores, MD  Service: FAMILY MEDICINE  Author Type: Resident       Filed: 12/08/15 1209  Date of Service: 12/06/15 2024  Status: Attested           Editor: Joan Flores, MD (Resident)  Cosigner: Darius Bump, MD at 12/08/15 2328          Attestation signed by Darius Bump, MD at 12/08/15 2328                  [facility] Attending Note       I have personally seen, evaluated and examined the patient. See resident's note for details. Agree with documentation of assessment and plan as documented. Plan and findings were discussed  with the team. In addition: d/c > 30 min: d/w case manager and grandson      Darius Bump, MD   12/08/2015, 11:28 PM                                            Discharge Summary   [facility]              [name]  Age: 82 y.o.   Sex: female   DOB: 1922/02/19           MRN: [id number]           DOA: 12/05/2015           Discharge Date: 12/08/2015           Attending:Margaret Kasandra Knudsen, MD           PCP: Pershing Proud, MD              ================================================================      Reason for Admission:    Dehydration   Altered mental status      Discharge Diagnoses:    Altered mental status   Type 2 Diabetes Mellitus   Hypertension   Dementia         Important notes to PCP/ follow-up studies and evaluations    All blood pressure meds held during admission and patient remained normotensive without meds. Please reevaluate blood pressure outpatient and restart meds as needed.   Pending labs and studies:   None    Operative Procedures:    None      Discharge Medications:        Current Discharge Medication List              CONTINUE these medications which have NOT CHANGED          Details        amLODIPine (NORVASC) 10 mg tablet  Take 1 Tab by mouth daily for 30 days.   Qty: 30 Tab,  Refills:  0               metFORMIN (GLUCOPHAGE) 850 mg tablet  Take 1 Tab by mouth two (2) times a day for 30 days.   Qty: 60 Tab, Refills:  0               metoprolol succinate (TOPROL-XL) 100 mg tablet  Take 100 mg by mouth daily.  lisinopril-hydroCHLOROthiazide (PRINZIDE, ZESTORETIC) 20-12.5 mg per tablet  Take 2 Tabs by mouth daily.               ferrous sulfate (IRON) 325 mg (65 mg iron) EC tablet  Take 325 mg by mouth three (3) times daily (with meals).               traMADol (ULTRAM) 50 mg tablet  Take 50 mg by mouth every six (6) hours as needed for Pain.                         Disposition: Home with  Home Health      Consultants:     None      Brief Hospital Course (including pertinent history  and physical findings)   Pt was seen in clinic by Dr. Harlon Ditty with daughter, who reported that patient has not been eating or drinking, had not been talking, and was not at her baseline. Pt was admitted with dehydration and  altered mental status. Pt had h/o UTI and was treated, but was suspicious for toxic metabolic encephalopathy 2/2 urosepsis. Pt was given IV fluids, Zosyn until cultures returned negative, and physical therapy. Patient to go home with home health for long  term care.       Summarized key findings and results (labs, imaging studies, ECHO, cardiac cath, endoscopies, etc):   Blood cx: NGTD   Urine Cx: NGTD   UA: Trace LE, few bacteria   CXR: No focal consolidation at lung bases to suggest aspiration. COPD      Functional status and cognitive function:     Ambulates with walker, assisted   Status: alert, cooperative, no distress, appears stated age, oriented x2, not to date or year      Diet:  Diabetic      Code status and advanced care plan: Full   [facility] of Contact:      Patient Education:        Follow-up:      Follow-up Information        Follow up With  Details  Comments  Contact Info             Pershing Proud, MD  On 12/12/2015  :Virgina Evener  [address]    [address]   [place] [address]   [phone]                      ================================================================   Germain Osgood, MD, PGY-1   [facility]   12/08/15 9:18 AM

## 2015-12-07 LAB — METABOLIC PANEL, COMPREHENSIVE
A-G Ratio: 1 (ref 0.8–1.7)
ALT (SGPT): 11 U/L — ABNORMAL LOW (ref 13–56)
AST (SGOT): 15 U/L (ref 15–37)
Albumin: 2.8 g/dL — ABNORMAL LOW (ref 3.4–5.0)
Alk. phosphatase: 38 U/L — ABNORMAL LOW (ref 45–117)
Anion gap: 8 mmol/L (ref 3.0–18)
BUN/Creatinine ratio: 38 — ABNORMAL HIGH (ref 12–20)
BUN: 27 MG/DL — ABNORMAL HIGH (ref 7.0–18)
Bilirubin, total: 0.6 MG/DL (ref 0.2–1.0)
CO2: 29 mmol/L (ref 21–32)
Calcium: 8.3 MG/DL — ABNORMAL LOW (ref 8.5–10.1)
Chloride: 105 mmol/L (ref 100–108)
Creatinine: 0.72 MG/DL (ref 0.6–1.3)
GFR est AA: >60 mL/min/{1.73_m2} (ref >60)
GFR est non-AA: >60 mL/min/{1.73_m2} (ref >60)
Globulin: 2.8 g/dL (ref 2.0–4.0)
Glucose: 65 mg/dL — ABNORMAL LOW (ref 74–99)
Potassium: 3.8 mmol/L (ref 3.5–5.5)
Protein, total: 5.6 g/dL — ABNORMAL LOW (ref 6.4–8.2)
Sodium: 142 mmol/L (ref 136–145)

## 2015-12-07 LAB — CBC WITH AUTOMATED DIFF
ABS. BASOPHILS: 0 10*3/uL (ref 0.0–0.1)
ABS. EOSINOPHILS: 0 10*3/uL (ref 0.0–0.4)
ABS. LYMPHOCYTES: 1.1 10*3/uL (ref 0.9–3.6)
ABS. MONOCYTES: 0.5 10*3/uL (ref 0.05–1.2)
ABS. NEUTROPHILS: 5.9 10*3/uL (ref 1.8–8.0)
BASOPHILS: 0 % (ref 0–2)
EOSINOPHILS: 0 % (ref 0–5)
HCT: 28.6 % — ABNORMAL LOW (ref 35.0–45.0)
HGB: 9.1 g/dL — ABNORMAL LOW (ref 12.0–16.0)
LYMPHOCYTES: 15 % — ABNORMAL LOW (ref 21–52)
MCH: 24.4 PG (ref 24.0–34.0)
MCHC: 31.8 g/dL (ref 31.0–37.0)
MCV: 76.7 FL (ref 74.0–97.0)
MONOCYTES: 7 % (ref 3–10)
MPV: 9.3 FL (ref 9.2–11.8)
NEUTROPHILS: 78 % — ABNORMAL HIGH (ref 40–73)
PLATELET: 202 10*3/uL (ref 135–420)
RBC: 3.73 M/uL — ABNORMAL LOW (ref 4.20–5.30)
RDW: 14.6 % — ABNORMAL HIGH (ref 11.6–14.5)
WBC: 7.6 10*3/uL (ref 4.6–13.2)

## 2015-12-07 LAB — GLUCOSE, POC
Glucose (POC): 249 mg/dL — ABNORMAL HIGH (ref 70–110)
Glucose (POC): 73 mg/dL (ref 70–110)
Glucose (POC): 101 mg/dL (ref 70–110)
Glucose (POC): 238 mg/dL — ABNORMAL HIGH (ref 70–110)
Glucose (POC): 121 mg/dL — ABNORMAL HIGH (ref 70–110)

## 2015-12-07 MED ORDER — SODIUM CHLORIDE 0.9 % IV PIGGY BACK
2.25 gram | Freq: Four times a day (QID) | INTRAVENOUS | Status: DC
Start: 2015-12-07 — End: 2015-12-08
  Administered 2015-12-07 – 2015-12-08 (×3): via INTRAVENOUS

## 2015-12-07 MED FILL — PIPERACILLIN-TAZOBACTAM 2.25 GRAM IV SOLR: 2.25 gram | INTRAVENOUS | Qty: 2.25

## 2015-12-07 MED FILL — PROTONIX 40 MG GRANULES DELAYED-RELEASE PACKET: 40 mg | ORAL | Qty: 1

## 2015-12-07 MED FILL — INSULIN LISPRO 100 UNIT/ML INJECTION: 100 unit/mL | SUBCUTANEOUS | Qty: 1

## 2015-12-07 MED FILL — FERROUS SULFATE 300 MG/5 ML ORAL LIQUID: 300 mg (60 mg iron)/5 mL | ORAL | Qty: 5

## 2015-12-07 NOTE — Progress Notes (Signed)
Bedside and Verbal shift change report given to Adeline, RN (oncoming nurse) by Lorenza Cambridge, RN (offgoing nurse). Report included the following information SBAR, Kardex, MAR and Recent Results.  ??  SITUATION:  Code Status: Full Code  Reason for Admission: Dehydration  Altered mental status  Hospital day: 2  Problem List:   ????  Hospital Problems  Never Reviewed    ???? ???? ???? Codes Class Noted POA   ?? * (Principal)Altered mental status ICD-10-CM: R41.82  ICD-9-CM: 780.97 ?? 12/05/2015 Unknown   ?? ??      ??  ??  BACKGROUND:                        Past Medical History:        Past Medical History:   Diagnosis Date   ??? Arthritis ??   ??? Diabetes (HCC) ??   ??? Hypertension ??   ??                        Patient taking anticoagulants no                         Patient has a defibrillator: no                         History of shots YES for example, flu, pneumonia, tetanus                        Isolation History NO for example, MRSA, CDiff  ??  ASSESSMENT:  Changes in Assessment Throughout Shift: None  Significant Changes in 24 hours (for example, RR/code, fall)  Patient has Central Line: no Reasons if yes: N/A  Patient has Foley Cath: no Reasons if yes: N/A   Mobility Issues  PT  IV Patency  OR Checklist  Pending Tests  ??  Last Vitals:  Vitals w/ MEWS Score (last day)     Date/Time MEWS Score Pulse Resp Temp BP Level of Consciousness SpO2   ?? 12/07/15 0403 1 88 17 98.9 ??F (37.2 ??C) 110/68 Alert 98 %   ?? 12/06/15 2348 2 (!)  102 16 98.4 ??F (36.9 ??C) 123/63 Alert 98 %   ?? 12/06/15 2113 1 85 16 98.5 ??F (36.9 ??C) 105/46 Alert 98 %   ?? 12/06/15 1600 1 83 16 97.1 ??F (36.2 ??C) 122/53 Alert 100 %   ?? 12/06/15 1246 1 79 16 98.3 ??F (36.8 ??C) 112/67 Alert 98 %   ?? 12/06/15 0400 1 82 16 97.2 ??F (36.2 ??C) 132/67 Alert 98 %   ?? ??      ??  PAIN                                              Pain Assessment                                              Pain Intensity 1: 0 (12/07/15 0400)  Patient Stated Pain Goal: 0  Intervention effective: N/A  Time of last intervention: N/A Reassessment Completed: N/A  Other actions taken for pain: N/A  ??  Last 3 Weights:  Last 3 Recorded Weights in this Encounter   ?? 12/06/15 1554   Weight: 38.1 kg (84 lb 1.6 oz)   Weight change:   ??  INTAKE/OUPUT                                              Current Shift:                                                Last three shifts: 08/01 1901 - 08/03 0700  In: 1066 [P.O.:260; I.V.:806]  Out: 1030 [Urine:1030]  ??  RECOMMENDATIONS AND DISCHARGE PLANNING  Patient needs and requests: None  ??  Pending tests/procedures: None   ??  Discharge plan for patient: TBD  ??  Discharge planning Needs or Barriers: TBD  ??  Estimated Discharge Date: TBD Posted on Whiteboard in Patient???s Room: yes   ????  "HEALS" SAFETY CHECK  A safety check occurred in the patient's room between off going nurse and oncoming nurse listed above.  ??  The safety check included the below items:  ??  H  High Alert Medications                      Verify all high alert medication drips (heparin, PCA, etc.)  E  Equipment                 Suction is set up for ALL patients (with yanker)  Red plugs utilized for all equipment (IV pumps, etc.)  WOW???s wiped down at end of shift.  Room stocked with oxygen, suction, and other unit-specific supplies  A  Alarms           Bed alarm is set for fall risk patients  Ensure chair alarm is in place and activated if patient is up in a chair  L  Lines              Check IV for any infiltration  Foley bag is empty if patient has a Foley   Tubing and IV bags are labeled  S  Safety  Room is clean, patient is clean, and equipment is clean.  Hallways are clear from equipment besides carts.   Fall bracelet on for fall risk patients  Ensure room is clear and free of clutter  Suction is set up for ALL patients (with yanker)   Hallways are clear from equipment besides carts.   Isolation precautions followed, supplies available outside room, sign posted

## 2015-12-07 NOTE — Other (Deleted)
Patient is noted to have a BMI of   16.45;   Please clarify if this patient is:     =>Underweight  =>Cachexia  =>Failure to Thrive  =>Other explanation of clinical findings  =>Unable to determine (no explanation for clinical findings)    Presentation: 5'0",   84   lbs = BMI   16.45     pt is  recommended by nutritionists to :  "  Add supplement: Glucerna Shake BID, Ensure Pudding once daily "    Please clarify and document your clinical opinion in the progress notes and discharge summary, including the definitive and or presumptive diagnosis, (suspected or probable), related to the above clinical findings.  Please include clinical findings supporting your diagnosis.     =>  =>Other Explanation of clinical findings    Thank you,   Jolene Provost   RN   CCDS

## 2015-12-07 NOTE — Other (Signed)
Please clarify if this patient is being treated/managed for:    =>mild protein calorie malnutrition in setting of  advanced age  requiring  dietary supplements    =>Other Explanation of clinical findings  =>Unable to Determine (no explanation of clinical findings)    The medical record reflects the following:    Risk:  advanced age;   chewing issues without teeth.  recent infection     Clinical Indicators:  per nutrition"  pt experienced unplanned weight loss PTA, was not able to tolerate last night's dinner due to not having her teeth, and has has decreased appetite and meal intake PTA. Pt underweight; BMI 16.4 kg/m^2 "    Treatment:   Add supplement: Glucerna Shake BID, Ensure Pudding once daily  - Downgrade diet consistency to soft solids  - Continue RD inpatient monitoring and evaluation.    Thank you,   Jolene Provost   RN    CCDS   x [phone]

## 2015-12-07 NOTE — Other (Signed)
Bedside and Verbal shift change report given to Doreene Burke, Charity fundraiser (oncoming nurse) by Clyde Lundborg, RN (offgoing nurse). Report included the following information SBAR, Kardex, MAR and Recent Results.    SITUATION:  Code Status: Full Code  Reason for Admission: Dehydration  Altered mental status  Hospital day: 2  Problem List:       Hospital Problems  Never Reviewed          Codes Class Noted POA    * (Principal)Altered mental status ICD-10-CM: R41.82  ICD-9-CM: 780.97  12/05/2015 Unknown              BACKGROUND:   Past Medical History:   Past Medical History:   Diagnosis Date   ??? Arthritis    ??? Diabetes (HCC)    ??? Hypertension       Patient taking anticoagulants no    Patient has a defibrillator: no    History of shots YES for example, flu, pneumonia, tetanus   Isolation History NO for example, MRSA, CDiff    ASSESSMENT:  Changes in Assessment Throughout Shift: None  Significant Changes in 24 hours (for example, RR/code, fall)  Patient has Central Line: no Reasons if yes: N/A  Patient has Foley Cath: no Reasons if yes: N/A   Mobility Issues  PT  IV Patency  OR Checklist  Pending Tests    Last Vitals:  Vitals w/ MEWS Score (last day)     Date/Time MEWS Score Pulse Resp Temp BP Level of Consciousness SpO2    12/07/15 0403 1 88 17 98.9 ??F (37.2 ??C) 110/68 Alert 98 %    12/06/15 2348 2 (!)  102 16 98.4 ??F (36.9 ??C) 123/63 Alert 98 %    12/06/15 2113 1 85 16 98.5 ??F (36.9 ??C) 105/46 Alert 98 %    12/06/15 1600 1 83 16 97.1 ??F (36.2 ??C) 122/53 Alert 100 %    12/06/15 1246 1 79 16 98.3 ??F (36.8 ??C) 112/67 Alert 98 %    12/06/15 0400 1 82 16 97.2 ??F (36.2 ??C) 132/67 Alert 98 %            PAIN    Pain Assessment    Pain Intensity 1: 0 (12/07/15 0400)              Patient Stated Pain Goal: 0  Intervention effective: N/A  Time of last intervention: N/A Reassessment Completed: N/A  Other actions taken for pain: N/A    Last 3 Weights:  Last 3 Recorded Weights in this Encounter    12/06/15 1554    Weight: 38.1 kg (84 lb 1.6 oz)   Weight change:     INTAKE/OUPUT    Current Shift:      Last three shifts: 08/01 1901 - 08/03 0700  In: 1066 [P.O.:260; I.V.:806]  Out: 1030 [Urine:1030]    RECOMMENDATIONS AND DISCHARGE PLANNING  Patient needs and requests: None    Pending tests/procedures: None     Discharge plan for patient: TBD    Discharge planning Needs or Barriers: TBD    Estimated Discharge Date: TBD Posted on Whiteboard in Patient???s Room: yes      "HEALS" SAFETY CHECK  A safety check occurred in the patient's room between off going nurse and oncoming nurse listed above.    The safety check included the below items:    H  High Alert Medications Verify all high alert medication drips (heparin, PCA, etc.)  E  Equipment Suction is set up for ALL  patients (with yanker)  Red plugs utilized for all equipment (IV pumps, etc.)  WOW???s wiped down at end of shift.  Room stocked with oxygen, suction, and other unit-specific supplies  A  Alarms Bed alarm is set for fall risk patients  Ensure chair alarm is in place and activated if patient is up in a chair  L  Lines Check IV for any infiltration  Foley bag is empty if patient has a Foley   Tubing and IV bags are labeled  S  Safety  Room is clean, patient is clean, and equipment is clean.  Hallways are clear from equipment besides carts.   Fall bracelet on for fall risk patients  Ensure room is clear and free of clutter  Suction is set up for ALL patients (with yanker)  Hallways are clear from equipment besides carts.   Isolation precautions followed, supplies available outside room, sign posted    Adeline Florene Glen, RN

## 2015-12-07 NOTE — Progress Notes (Signed)
Problem: Mobility Impaired (Adult and Pediatric)  Goal: *Acute Goals and Plan of Care (Insert Text)  STG for 8.2.17 to be completed by 8.9.17 (7 days).  1. Pt will complete supine to/from sit (I) in order to complete EOB activity.  2. Pt will complete sit to/from stand transfer with RW and S in prep for ambulation.  3. Pt will ambulate 50 feet with RW and S in order to negotiate household distances.   Outcome: Progressing Towards Goal  PHYSICAL THERAPY TREATMENT     [name] (82 y.o. female)  Date: 12/07/2015  Diagnosis: Dehydration  Altered mental status Altered mental status       Precautions:    Chart, physical therapy assessment, plan of care and goals were reviewed.      ASSESSMENT:  Pt is progressing well, as evidenced by ability to ambulate 110 ft with RW and CGA/SBA this session. Pt demo decreased gait speed and decreased step length (B); however, no LOB noted. Pt very pleasant and cooperative, returned to room and sat up in chair for breakfast. Doreene Burke RN notified of pt up in chair. Recommend continued therapy during acute stay.  Progression toward goals:  [X]       Improving appropriately and progressing toward goals  [ ]       Improving slowly and progressing toward goals  [ ]       Not making progress toward goals and plan of care will be adjusted       PLAN:  Patient continues to benefit from skilled intervention to address the above impairments.  Continue treatment per established plan of care.  Discharge Recommendations:  Home Health with 24 hour supervision vs Skilled Nursing Facility pending family assist  Further Equipment Recommendations for Discharge:  rolling walker, 3-in-1 BSC       SUBJECTIVE:   Patient stated ???I don't remember.??? when asked the year      OBJECTIVE DATA SUMMARY:   Critical Behavior:  Neurologic State: Alert  Orientation Level: Oriented to person, Oriented to place, Appropriate for age (oriented to month, disoriented to year)  Cognition: Follows Secondary school teacher:  Bed Mobility:  Supine to Sit: Supervision;Additional time;Assist x1  Transfers:  Sit to Stand: Minimum assistance;Contact guard assistance  Stand to Sit: Stand-by asssistance  Balance:  Sitting: Impaired  Sitting - Static: Good (unsupported)  Sitting - Dynamic: Good (unsupported)  Standing: Impaired  Standing - Static: Good  Standing - Dynamic : Fair (Fair+)  Ambulation/Gait Training:  Distance (ft): 110 Feet (ft)  Assistive Device: Walker, rolling  Ambulation - Level of Assistance: Contact guard assistance;Stand-by asssistance  Gait Description (WDL): Exceptions to WDL  Gait Abnormalities: Decreased step clearance;Step to gait  Base of Support: Center of gravity altered  Speed/Cadence: Slow;Pace decreased (<100 feet/min)  Step Length: Right shortened;Left shortened  Pain:  Pain Scale 1: Numeric (0 - 10)  Pain Intensity 1: 0   Post-treatment pain: 0  Activity Tolerance:   Pt reported no symptomatic complaints throughout session.  Please refer to the flowsheet for vital signs taken during this treatment.  After treatment:   [X]  Patient left in no apparent distress sitting up in chair  [ ]  Patient left in no apparent distress in bed  [X]  Call bell left within reach  [X]  Nursing notified  [ ]  Caregiver present  [ ]  Bed alarm activated      Luz Brazen PT, DPT   Time Calculation: 23 mins

## 2015-12-07 NOTE — Other (Signed)
Glycemic Control Plan of Care    BG above target range.  POC BG range on 12/06/2015: 98-249 mg/dL.  POC BG report on 12/07/2015 at time of review: 73, 238 mg/dL.    Patient awake, alert and stated that she's trying to eat more.    Current Meal Intake:  Patient Vitals for the past 100 hrs:   % Diet Eaten   12/07/15 1340 50 %   12/07/15 0930 20 %   12/06/15 1828 25 %   12/06/15 0800 20 %     Recommendation(s):  1.) Modified correctional lispro insulin to very resistant dose given two POC BG readings > 200 mg/dL within 24 hour period.  2.) Continue to assess POC BG daily.    Assessment:  Patient is 82 year old with past medical history including type 2 diabetes mellitus, hypertension, and recent hospital admission and treated for Klebsiella pneumonia. She was discharged on 11/22/2015. Patient was readmitted on 12/05/2015 after seen at PCP office with altered mental status of decrease po intake.    Noted:  Dehydration.  Type 2 diabetes mellitus with reported A1C of 6.6% per H&P.    Most recent blood glucose values:    Results for CRUZITA, OVERWEG (MRN [id number]) as of 12/07/2015 15:42   Ref. Range 12/06/2015 00:39 12/06/2015 08:15 12/06/2015 12:31 12/06/2015 15:39 12/06/2015 15:41 12/06/2015 21:37   GLUCOSE,FAST - POC Latest Ref Range: 70 - 110 mg/dL 213 (H) 086 578 (H) 469 98 249 (H)     Results for RUSTI, HARDEN (MRN [id number]) as of 12/07/2015 15:42   Ref. Range 12/07/2015 08:11 12/07/2015 11:54   GLUCOSE,FAST - POC Latest Ref Range: 70 - 110 mg/dL 73 244 (H)     Current A1C: 6.6% per H&P which is equivalent to average blood glucose of 143 mg/dL during the past 2-3 months.    Current hospital diabetes medications:  Correctional lispro insulin ACHS. Very resistant dose.    Total daily dose insulin requirement previous day: 12/06/2015  Lispro: 6 units    Home diabetes medications:   Metformin 850 mg 3x daily.    Diet: Diabetic consistent carb; soft solids; nutr suppl: glucerna shake  for breakfast and lunch, and ensure pudding for dinner.    Goals:  Blood glucose will be within target range of 70-180 mg/dL by 01.06/7251.     Education:  ___  Refer to Diabetes Education Record             _X__  Education not indicated at this time    Sharyne Richters, RN

## 2015-12-07 NOTE — Progress Notes (Addendum)
Intern Progress Note  [facility]       [name] MRN: [id number]  CSN: [id number]    Date of Birth: 21-Feb-1922  Age: 82 y.o.  Sex: female    DOA: 12/05/2015 LOS:  LOS: 2 days                    Subjective:     Acute events: No acute events overnight. Pt is resting comfortably. She is now using bedside commode instead of ambulating to the bathroom. She has decreased PO intake, though no complaints of trouble with swallowing or pain with swallowing. Pt reports that she is not hungry. She is more alert and responsive after IV fluids.    Review of Systems   Respiratory: Negative for shortness of breath.    Cardiovascular: Negative for chest pain.   Gastrointestinal: Negative for constipation, diarrhea, nausea and vomiting.   Genitourinary: Negative for dysuria, frequency and urgency.   Neurological: Negative for dizziness, weakness and headaches.       Objective:      Patient Vitals for the past 24 hrs:   Temp Pulse Resp BP SpO2   12/07/15 1115 98 ??F (36.7 ??C) 78 18 124/59 97 %   12/07/15 0806 97.8 ??F (36.6 ??C) 85 16 120/66 95 %   12/07/15 0403 98.9 ??F (37.2 ??C) 88 17 110/68 98 %   12/06/15 2348 98.4 ??F (36.9 ??C) (!) 102 16 123/63 98 %   12/06/15 2113 98.5 ??F (36.9 ??C) 85 16 105/46 98 %   12/06/15 1600 97.1 ??F (36.2 ??C) 83 16 122/53 100 %   12/06/15 1246 98.3 ??F (36.8 ??C) 79 16 112/67 98 %         Intake/Output Summary (Last 24 hours) at 12/07/15 1137  Last data filed at 12/07/15 1016   Gross per 24 hour   Intake             1366 ml   Output             1325 ml   Net               41 ml       Physical Exam   Cardiovascular: Normal rate, regular rhythm and normal heart sounds.    Pulmonary/Chest: Effort normal and breath sounds normal.   Abdominal: Soft. Bowel sounds are normal.   Neurological: She is alert.   Psychiatric: She has a normal mood and affect. Her behavior is normal.       Lab/Data Reviewed:  Recent Results (from the past 24 hour(s))   GLUCOSE, POC     Collection Time: 12/06/15 12:31 PM   Result Value Ref Range    Glucose (POC) 173 (H) 70 - 110 mg/dL   GLUCOSE, POC    Collection Time: 12/06/15  3:39 PM   Result Value Ref Range    Glucose (POC) 101 70 - 110 mg/dL   GLUCOSE, POC    Collection Time: 12/06/15  3:41 PM   Result Value Ref Range    Glucose (POC) 98 70 - 110 mg/dL   URINALYSIS W/ RFLX MICROSCOPIC    Collection Time: 12/06/15  4:00 PM   Result Value Ref Range    Color YELLOW      Appearance CLEAR      Specific gravity 1.011 1.005 - 1.030      pH (UA) 5.5 5.0 - 8.0      Protein NEGATIVE  NEG mg/dL  Glucose NEGATIVE  NEG mg/dL    Ketone NEGATIVE  NEG mg/dL    Bilirubin NEGATIVE  NEG      Blood NEGATIVE  NEG      Urobilinogen 0.2 0.2 - 1.0 EU/dL    Nitrites NEGATIVE  NEG      Leukocyte Esterase TRACE (A) NEG     URINE MICROSCOPIC ONLY    Collection Time: 12/06/15  4:00 PM   Result Value Ref Range    WBC 2 to 4 0 - 4 /hpf    RBC NONE 0 - 5 /hpf    Epithelial cells 1+ 0 - 5 /lpf    Bacteria FEW (A) NEG /hpf   GLUCOSE, POC    Collection Time: 12/06/15  9:37 PM   Result Value Ref Range    Glucose (POC) 249 (H) 70 - 376 mg/dL   METABOLIC PANEL, COMPREHENSIVE    Collection Time: 12/07/15  2:32 AM   Result Value Ref Range    Sodium 142 136 - 145 mmol/L    Potassium 3.8 3.5 - 5.5 mmol/L    Chloride 105 100 - 108 mmol/L    CO2 29 21 - 32 mmol/L    Anion gap 8 3.0 - 18 mmol/L    Glucose 65 (L) 74 - 99 mg/dL    BUN 27 (H) 7.0 - 18 MG/DL    Creatinine 0.72 0.6 - 1.3 MG/DL    BUN/Creatinine ratio 38 (H) 12 - 20      GFR est AA >60 >60 ml/min/1.85m    GFR est non-AA >60 >60 ml/min/1.71m   Calcium 8.3 (L) 8.5 - 10.1 MG/DL    Bilirubin, total 0.6 0.2 - 1.0 MG/DL    ALT (SGPT) 11 (L) 13 - 56 U/L    AST (SGOT) 15 15 - 37 U/L    Alk. phosphatase 38 (L) 45 - 117 U/L    Protein, total 5.6 (L) 6.4 - 8.2 g/dL    Albumin 2.8 (L) 3.4 - 5.0 g/dL    Globulin 2.8 2.0 - 4.0 g/dL    A-G Ratio 1.0 0.8 - 1.7     CBC WITH AUTOMATED DIFF    Collection Time: 12/07/15  2:32 AM    Result Value Ref Range    WBC 7.6 4.6 - 13.2 K/uL    RBC 3.73 (L) 4.20 - 5.30 M/uL    HGB 9.1 (L) 12.0 - 16.0 g/dL    HCT 28.6 (L) 35.0 - 45.0 %    MCV 76.7 74.0 - 97.0 FL    MCH 24.4 24.0 - 34.0 PG    MCHC 31.8 31.0 - 37.0 g/dL    RDW 14.6 (H) 11.6 - 14.5 %    PLATELET 202 135 - 420 K/uL    MPV 9.3 9.2 - 11.8 FL    NEUTROPHILS 78 (H) 40 - 73 %    LYMPHOCYTES 15 (L) 21 - 52 %    MONOCYTES 7 3 - 10 %    EOSINOPHILS 0 0 - 5 %    BASOPHILS 0 0 - 2 %    ABS. NEUTROPHILS 5.9 1.8 - 8.0 K/UL    ABS. LYMPHOCYTES 1.1 0.9 - 3.6 K/UL    ABS. MONOCYTES 0.5 0.05 - 1.2 K/UL    ABS. EOSINOPHILS 0.0 0.0 - 0.4 K/UL    ABS. BASOPHILS 0.0 0.0 - 0.1 K/UL    DF AUTOMATED     GLUCOSE, POC    Collection Time: 12/07/15  8:11 AM  Result Value Ref Range    Glucose (POC) 73 70 - 110 mg/dL       Scheduled Medications Reviewed:  Current Facility-Administered Medications   Medication Dose Route Frequency   ??? piperacillin-tazobactam (ZOSYN) 2.25 g in 0.9% sodium chloride (MBP/ADV) 50 mL MBP  2.25 g IntraVENous Q6H   ??? 0.9% sodium chloride infusion  60 mL/hr IntraVENous CONTINUOUS   ??? ferrous sulfate 300 mg (60 mg iron)/5 mL oral syrup 300 mg  300 mg Oral DAILY   ??? insulin lispro (HUMALOG) injection   SubCUTAneous AC&HS   ??? pantoprazole (PROTONIX) granules for oral suspension 40 mg  40 mg Oral ACB         Imaging, microbiology, and EKG/Telemetry:      Assessment/Plan     82 y.o. Female with PMH type 2 DM, HTN, and arthritis admitted with??Altered mental status (12/05/2015)/toxic/met encephalopathy: r/o infection vs metabolic ( including poss AKI/electolyte abnormality) vs others. No focal findings.  ????  Failure to thrive d/t poor PO intake- possibly d/t altered mental status: r/o infection, no evidence of dysphagia; WBC: 7.6, K: 3.1>3- 70m given once>3.8; Na: 134>135>142; UA: trace LE, few bacteria, urine culture: NG16hr, blood culture: NGTD. Supplementing diet with Ensure pudding and Glucerna shakes  ????   HTN: currently normotensive, hold meds, continue monitoring BP  ????  DMT2: last HG A1c was 6.6, meds were readjusted at that time. Hold metformin for now with decreased PO intake. Accu check AC&HS and SSI for BG coverage  ??  Routine care: Fall risk protocol, PT/OT consulted- PT recommends getting pt in chair 3x daily, up with walker with 1 person assist to ambulate to the bathroom  ??  ??  Diet: Diabetic- supplementing with Ensure and pudding to increase calorie intake  DVT Prophylaxis: SCDs  Code Status: Full  Point of Contact: Madie Governale: [phone](Daughter)  ??  [facility]   JHeide Spark MD, PGY-1  [facility]

## 2015-12-08 LAB — CBC WITH AUTOMATED DIFF
ABS. BASOPHILS: 0 10*3/uL (ref 0.0–0.1)
ABS. EOSINOPHILS: 0.1 10*3/uL (ref 0.0–0.4)
ABS. LYMPHOCYTES: 1.2 10*3/uL (ref 0.9–3.6)
ABS. MONOCYTES: 0.8 10*3/uL (ref 0.05–1.2)
ABS. NEUTROPHILS: 3.5 10*3/uL (ref 1.8–8.0)
BASOPHILS: 0 % (ref 0–2)
EOSINOPHILS: 1 % (ref 0–5)
HCT: 27.9 % — ABNORMAL LOW (ref 35.0–45.0)
HGB: 8.8 g/dL — ABNORMAL LOW (ref 12.0–16.0)
LYMPHOCYTES: 22 % (ref 21–52)
MCH: 24.1 PG (ref 24.0–34.0)
MCHC: 31.5 g/dL (ref 31.0–37.0)
MCV: 76.4 FL (ref 74.0–97.0)
MONOCYTES: 14 % — ABNORMAL HIGH (ref 3–10)
MPV: 9.7 FL (ref 9.2–11.8)
NEUTROPHILS: 63 % (ref 40–73)
PLATELET: 201 10*3/uL (ref 135–420)
RBC: 3.65 M/uL — ABNORMAL LOW (ref 4.20–5.30)
RDW: 14.8 % — ABNORMAL HIGH (ref 11.6–14.5)
WBC: 5.6 10*3/uL (ref 4.6–13.2)

## 2015-12-08 LAB — GLUCOSE, POC
Glucose (POC): 144 mg/dL — ABNORMAL HIGH (ref 70–110)
Glucose (POC): 94 mg/dL (ref 70–110)
Glucose (POC): 194 mg/dL — ABNORMAL HIGH (ref 70–110)
Glucose (POC): 109 mg/dL (ref 70–110)

## 2015-12-08 LAB — CULTURE, URINE
Culture result:: NO GROWTH
Culture: NO GROWTH

## 2015-12-08 MED FILL — FERROUS SULFATE 300 MG/5 ML ORAL LIQUID: 300 mg (60 mg iron)/5 mL | ORAL | Qty: 5

## 2015-12-08 MED FILL — INSULIN LISPRO 100 UNIT/ML INJECTION: 100 unit/mL | SUBCUTANEOUS | Qty: 1

## 2015-12-08 MED FILL — PIPERACILLIN-TAZOBACTAM 2.25 GRAM IV SOLR: 2.25 gram | INTRAVENOUS | Qty: 2.25

## 2015-12-08 MED FILL — PROTONIX 40 MG GRANULES DELAYED-RELEASE PACKET: 40 mg | ORAL | Qty: 1

## 2015-12-08 NOTE — Progress Notes (Signed)
NUTRITION    Nutrition Consult: General Nutrition Management & Supplements      RECOMMENDATIONS / PLAN:     - Continue current nutrition interventions  - Continue RD inpatient monitoring and evaluation.     NUTRITION INTERVENTIONS & DIAGNOSIS:      Meals/Snacks: modified diet   Medical food supplementation:  Glucerna Shake BID, Ensure Pudding once daily     Nutrition Diagnosis:  Underweight related to inadequate energy intake as evidenced by BMI 16.4 kg/m^2  Chewing difficulty related to pt not having her teeth as evidenced by pt avoiding to eat per H&P    ASSESSMENT:     8/4: Pt reported good appetite and meal intake. Consuming supplements; fair intake. Encouraged pt to continue po intake meals/supplements following discharge as well. Pt verbalized understanding    8/2: Pt lethargic at time of visit. Per chart review, pt experienced unplanned weight loss PTA, was not able to tolerate last night's dinner due to not having her teeth, and has has decreased appetite and meal intake PTA. Pt underweight; BMI 16.4 kg/m^2    Average po intake adequate to meet patients estimated nutritional needs:    Yes      No    Unable to determine at this time    Diet: DIET NUTRITIONAL SUPPLEMENTS Breakfast, Lunch; GLUCERNA SHAKE  DIET NUTRITIONAL SUPPLEMENTS Dinner; ENSURE PUDDING  DIET REGULAR No added salt  DIET DIABETIC CONSISTENT CARB Soft Solids      Food Allergies:  None known   Current Appetite:    Good      Fair      Poor      Other: unknown   Appetite/meal intake prior to admission:    Good      Fair      Poor (per nursing screen)      Other:  Feeding Limitations:   Swallowing difficulty     Chewing difficulty: pt did not have her teeth per H&P     Other:  Current Meal Intake:   Patient Vitals for the past 100 hrs:   % Diet Eaten   12/07/15 1340 50 %   12/07/15 0930 20 %   12/06/15 1828 25 %   12/06/15 0800 20 %       BM:  8/1  Skin Integrity:  No pressure ulcer or wound noted  Edema:  None   Pertinent Medications: Reviewed     Recent Labs      12/07/15   0232  12/06/15   1145  12/06/15   0433  12/05/15   1919   NA  142   --   135*  134*   K  3.8   --   3.0*  3.1*   CL  105   --   98*  93*   CO2  29   --   28  31   GLU  65*   --   105*  190*   BUN  27*   --   54*  65*   CREA  0.72   --   0.93  1.15   CA  8.3*   --   8.4*  8.8   MG   --   1.7   --    --    ALB  2.8*   --   2.9*  3.1*   SGOT  15   --   13*  10*   ALT  11*   --   14  14       Intake/Output Summary (Last 24 hours) at 12/08/15 1747  Last data filed at 12/08/15 0645   Gross per 24 hour   Intake                0 ml   Output             1200 ml   Net            -1200 ml       Anthropometrics:  Ht Readings from Last 1 Encounters:   12/06/15 5' (1.524 m)     Last 3 Recorded Weights in this Encounter    12/06/15 1554   Weight: 38.1 kg (84 lb 1.6 oz)     Body mass index is 16.42 kg/(m^2).    Underweight      Weight History:  Per H&P, pt had unplanned weight loss of 18 lb in past 3 months PTA    Weight Metrics 12/06/2015 11/22/2015 11/21/2015   Weight 84 lb 1.6 oz 88 lb -   BMI 16.42 kg/m2 - 17.19 kg/m2        Admitting Diagnosis: Dehydration  Altered mental status  Pertinent PMHx:  DM, HTN    Education Needs:         None identified   Identified - Not appropriate at this time    Identified and addressed - refer to education log  Learning Limitations:    None identified   Identified    Cultural, religious & ethnic food preferences:   None identified     Identified and addressed     ESTIMATED NUTRITION NEEDS:     Additional 500 kcal daily for promotion of weight gain  Calories: 1360-1431 kcal (MSJx1.2-1.3) based on   Actual BW: 38 kg      IBW   Protein: 30-42 gm (0.8-1.1 gm/kg) based on   Actual BW       IBW   Fluid: 1 mL/kcal     MONITORING & EVALUATION:     Nutrition Goal(s):   1. Po intake of meals will meet >75% of patient estimated nutritional needs within the next 7 days.  Outcome:   Met/Ongoing      Not Met/Progressing     New/Initial Goal      Monitoring:    Diet tolerance    Meal intake    Supplement intake    GI symptoms/ability to tolerate po diet    Respiratory status    Plan of care      Previous Recommendations (for follow-up assessments only):        Implemented          Not Implemented (RD to address)      No Recommendation Made     Discharge Planning:  Regular diet; consistency as tolerates    Participated in care planning, discharge planning, & interdisciplinary rounds as appropriate      Coralee North, Tamora   Pager: [phone]

## 2015-12-08 NOTE — Progress Notes (Signed)
Intern Progress Note  [facility]       Patient: Sabre Romberger MRN: [id number]  CSN: [id number]    Date of Birth: [birth year]  Age: 82 y.o.  Sex: female    DOA: 12/05/2015 LOS:  LOS: 3 days                    Subjective:     Acute events: No acute events overnight. Pt's encephalopathy is improved and she is able to communicate better. She has been eating more since her daughter brought her teeth yesterday. She has no complaints.     Review of Systems   Respiratory: Negative for cough and shortness of breath.    Cardiovascular: Negative for chest pain and leg swelling.   Gastrointestinal: Negative for abdominal pain, constipation, diarrhea, nausea and vomiting.   Genitourinary: Negative for dysuria, frequency and urgency.   Neurological: Negative for dizziness and headaches.       Objective:      Patient Vitals for the past 24 hrs:   Temp Pulse Resp BP SpO2   12/08/15 0337 100 ??F (37.8 ??C) 78 16 125/71 94 %   12/07/15 2331 99 ??F (37.2 ??C) 64 16 130/65 95 %   12/07/15 1955 99.5 ??F (37.5 ??C) 86 16 123/65 95 %   12/07/15 1521 97.3 ??F (36.3 ??C) 65 16 132/64 95 %   12/07/15 1115 98 ??F (36.7 ??C) 78 18 124/59 97 %   12/07/15 0806 97.8 ??F (36.6 ??C) 85 16 120/66 95 %         Intake/Output Summary (Last 24 hours) at 12/08/15 0738  Last data filed at 12/08/15 0645   Gross per 24 hour   Intake              660 ml   Output             2025 ml   Net            -1365 ml       Physical Exam   Cardiovascular: Normal rate, regular rhythm and normal heart sounds.    Pulmonary/Chest: Effort normal and breath sounds normal.   Abdominal: Soft. Bowel sounds are normal. She exhibits no distension. There is no tenderness.   Neurological: She is alert.   Skin: Skin is warm and dry.   Psychiatric: She has a normal mood and affect. Her behavior is normal.       Lab/Data Reviewed:  Recent Results (from the past 24 hour(s))   GLUCOSE, POC    Collection Time: 12/07/15  8:11 AM   Result Value Ref Range     Glucose (POC) 73 70 - 110 mg/dL   GLUCOSE, POC    Collection Time: 12/07/15 11:54 AM   Result Value Ref Range    Glucose (POC) 238 (H) 70 - 110 mg/dL   GLUCOSE, POC    Collection Time: 12/07/15  3:43 PM   Result Value Ref Range    Glucose (POC) 121 (H) 70 - 110 mg/dL   GLUCOSE, POC    Collection Time: 12/07/15  9:18 PM   Result Value Ref Range    Glucose (POC) 144 (H) 70 - 110 mg/dL   CBC WITH AUTOMATED DIFF    Collection Time: 12/08/15  4:12 AM   Result Value Ref Range    WBC 5.6 4.6 - 13.2 K/uL    RBC 3.65 (L) 4.20 - 5.30 M/uL    HGB 8.8 (L) 12.0 - 16.0  g/dL    HCT 27.9 (L) 35.0 - 45.0 %    MCV 76.4 74.0 - 97.0 FL    MCH 24.1 24.0 - 34.0 PG    MCHC 31.5 31.0 - 37.0 g/dL    RDW 14.8 (H) 11.6 - 14.5 %    PLATELET 201 135 - 420 K/uL    MPV 9.7 9.2 - 11.8 FL    NEUTROPHILS 63 40 - 73 %    LYMPHOCYTES 22 21 - 52 %    MONOCYTES 14 (H) 3 - 10 %    EOSINOPHILS 1 0 - 5 %    BASOPHILS 0 0 - 2 %    ABS. NEUTROPHILS 3.5 1.8 - 8.0 K/UL    ABS. LYMPHOCYTES 1.2 0.9 - 3.6 K/UL    ABS. MONOCYTES 0.8 0.05 - 1.2 K/UL    ABS. EOSINOPHILS 0.1 0.0 - 0.4 K/UL    ABS. BASOPHILS 0.0 0.0 - 0.1 K/UL    DF AUTOMATED         Scheduled Medications Reviewed:  Current Facility-Administered Medications   Medication Dose Route Frequency   ??? 0.9% sodium chloride infusion  60 mL/hr IntraVENous CONTINUOUS   ??? ferrous sulfate 300 mg (60 mg iron)/5 mL oral syrup 300 mg  300 mg Oral DAILY   ??? insulin lispro (HUMALOG) injection   SubCUTAneous AC&HS   ??? pantoprazole (PROTONIX) granules for oral suspension 40 mg  40 mg Oral ACB         Imaging, microbiology, and EKG/Telemetry:      Assessment/Plan     82 y.o. Female with PMH type 2 DM, HTN, and arthritis admitted with??Altered mental status (12/05/2015)/toxic/met encephalopathy: r/o infection vs metabolic ( including poss AKI/electolyte abnormality) vs others. No focal findings.  ????  Failure to thrive d/t poor PO intake- possibly d/t altered mental status:  r/o infection, no evidence of dysphagia; WBC: 7.6, K: 3.1>3- 8m given once>3.8; Na: 134>135>142; UA: trace LE, few bacteria, urine culture: NGTD, blood culture: NGTD. Supplementing diet with Ensure pudding and Glucerna shakes  ????  HTN: currently normotensive without meds, will adjust home regimen for discharge  ????  DMT2: last HG A1c was 6.6, meds were readjusted at that time. Hold metformin for now with decreased PO intake. Accu check AC&HS and SSI??for BG coverage  ????  Routine care: Fall risk protocol, PT/OT consulted- PT recommends getting pt in chair 3x daily, up with walker with 1 person assist to ambulate to the bathroom  ????  ????  Diet: Diabetic- supplementing with Ensure and pudding to increase calorie intake  DVT Prophylaxis: SCDs  Code Status: Full  Point of Contact: Madie Knotek: [phone](Daughter)  ????  Disposition: Awaiting placement at SNF- plan to discharge today    JHeide Spark MD, PGY-1  [facility]  12/08/15 7:38 AM

## 2015-12-08 NOTE — Other (Signed)
Bedside and Verbal shift change report given to Judeth Cornfield, Charity fundraiser (oncoming nurse) by Clyde Lundborg, RN (offgoing nurse). Report included the following information SBAR, Kardex, MAR and Recent Results.    SITUATION:  Code Status: Full Code  Reason for Admission: Dehydration  Altered mental status  Hospital day: 3  Problem List:       Hospital Problems  Never Reviewed          Codes Class Noted POA    * (Principal)Altered mental status ICD-10-CM: R41.82  ICD-9-CM: 780.97  12/05/2015 Unknown              BACKGROUND:   Past Medical History:   Past Medical History:   Diagnosis Date   ??? Arthritis    ??? Diabetes (HCC)    ??? Hypertension       Patient taking anticoagulants no    Patient has a defibrillator: no    History of shots YES for example, flu, pneumonia, tetanus   Isolation History NO for example, MRSA, CDiff    ASSESSMENT:  Changes in Assessment Throughout Shift: None  Significant Changes in 24 hours (for example, RR/code, fall)  Patient has Central Line: no Reasons if yes: N/A  Patient has Foley Cath: no Reasons if yes: N/A   Mobility Issues  PT  IV Patency  OR Checklist  Pending Tests    Last Vitals:  Vitals w/ MEWS Score (last day)     Date/Time MEWS Score Pulse Resp Temp BP Level of Consciousness SpO2    12/08/15 0848 1 75 18 97.1 ??F (36.2 ??C) 155/57 Alert 96 %    12/08/15 0337 1 78 16 100 ??F (37.8 ??C) 125/71 Alert 94 %    12/07/15 2331 1 64 16 99 ??F (37.2 ??C) 130/65 Alert 95 %    12/07/15 1955 1 86 16 99.5 ??F (37.5 ??C) 123/65 Alert 95 %    12/07/15 1521 1 65 16 97.3 ??F (36.3 ??C) 132/64 Alert 95 %    12/07/15 1115 1 78 18 98 ??F (36.7 ??C) 124/59 Alert 97 %    12/07/15 0806 1 85 16 97.8 ??F (36.6 ??C) 120/66 Alert 95 %    12/07/15 0403 1 88 17 98.9 ??F (37.2 ??C) 110/68 Alert 98 %            PAIN    Pain Assessment    Pain Intensity 1: 0 (12/08/15 0831)              Patient Stated Pain Goal: 0  Intervention effective: N/A  Time of last intervention: N/A Reassessment Completed: N/A  Other actions taken for pain: N/A     Last 3 Weights:  Last 3 Recorded Weights in this Encounter    12/06/15 1554   Weight: 38.1 kg (84 lb 1.6 oz)   Weight change:     INTAKE/OUPUT    Current Shift:      Last three shifts: 08/02 1901 - 08/04 0700  In: 877 [P.O.:660; I.V.:217]  Out: 2225 [Urine:2225]    RECOMMENDATIONS AND DISCHARGE PLANNING  Patient needs and requests: None    Pending tests/procedures: None     Discharge plan for patient: TBD    Discharge planning Needs or Barriers: TBD    Estimated Discharge Date: TBD Posted on Whiteboard in Patient???s Room: yes      "HEALS" SAFETY CHECK  A safety check occurred in the patient's room between off going nurse and oncoming nurse listed above.    The safety check included  the below items:    H  High Alert Medications Verify all high alert medication drips (heparin, PCA, etc.)  E  Equipment Suction is set up for ALL patients (with yanker)  Red plugs utilized for all equipment (IV pumps, etc.)  WOW???s wiped down at end of shift.  Room stocked with oxygen, suction, and other unit-specific supplies  A  Alarms Bed alarm is set for fall risk patients  Ensure chair alarm is in place and activated if patient is up in a chair  L  Lines Check IV for any infiltration  Foley bag is empty if patient has a Foley   Tubing and IV bags are labeled  S  Safety  Room is clean, patient is clean, and equipment is clean.  Hallways are clear from equipment besides carts.   Fall bracelet on for fall risk patients  Ensure room is clear and free of clutter  Suction is set up for ALL patients (with yanker)  Hallways are clear from equipment besides carts.   Isolation precautions followed, supplies available outside room, sign posted    Adeline Florene Glen, RN

## 2015-12-08 NOTE — Progress Notes (Signed)
Patient to be discharged home, transport to pick up at 6:00pm.  Discharge instructions reviewed with her daughter, Chayil Geno.

## 2015-12-08 NOTE — Progress Notes (Signed)
Chaplain conducted an initial consultation and Spiritual Assessment for [name] Juarez, who is a 82 y.o.,female. Patient???s Primary Language is: [place].   According to the patient???s EMR Religious Affiliation is: [place].     The reason the Patient came to the hospital is:   Patient Active Problem List    Diagnosis Date Noted   ??? Altered mental status 12/05/2015   ??? Hypomagnesemia 11/21/2015   ??? Chest pain 11/21/2015   ??? Weakness 11/21/2015   ??? Sepsis (HCC) 11/21/2015        The Chaplain provided the following Interventions:  Initiated a relationship of care and support.   Provided information about Spiritual Care Services.  Chart reviewed.    The following outcomes where achieved:  Chaplain confirmed Patient's Religious Affiliation.    Assessment:  Patient does not have any religious/cultural needs that will affect patient???s preferences in health care.  There are no spiritual or religious issues which require intervention at this time.     Plan:  Chaplains will continue to follow and will provide pastoral care on an as needed/requested basis.  Chaplain recommends bedside caregivers page chaplain on duty if patient shows signs of acute spiritual or emotional distress.    Chaplain Eilene Ghazi  Spiritual Care  [phone])

## 2015-12-08 NOTE — Progress Notes (Signed)
Patient discharged home in stable condition via medical transport stretcher. IV and armband removed before discharge.

## 2015-12-08 NOTE — Progress Notes (Signed)
Received a call from Resident stating tathat this pt requires long term care, for this pt. Resident was informed that this pt has no skillable needs which would  Lead to placement in a facility. Resident states that pt's daughter says she does not have the capacity to care for this pt any longer, and pt came to this facility dehydrated with an altered mental status. This Clinical research associate called pt's daughter Limmie Imm [phone], left a message for a return call.

## 2015-12-08 NOTE — Other (Signed)
Glycemic Control Plan of Care    BG above target range. Noted pattern of BG elevation after breakfast with fruit juices.  POC BG range on 12/07/2015: 73-238 mg/dL.  POC BG report on 12/08/2015 at time of review: 94, 194 mg/dL.    Patient stated that she's eating much better.    Recommendation(s):  1.) Modify diet by not having fruit juices with breakfast.  2.) Continue to assess POC BG daily.    Assessment:  Patient is 82 year old with past medical history including type 2 diabetes mellitus, hypertension, and recent hospital admission and treated for Klebsiella pneumonia. She was discharged on 11/22/2015. Patient was readmitted on 12/05/2015 after seen at PCP office with altered mental status of decrease po intake.    Noted:  Dehydration.  Type 2 diabetes mellitus with reported A1C of 6.6% per H&P.    Most recent blood glucose values:    Results for TASIANA, HASKE (MRN [id number]) as of 12/08/2015 12:56   Ref. Range 12/07/2015 08:11 12/07/2015 11:54 12/07/2015 15:43 12/07/2015 21:18   GLUCOSE,FAST - POC Latest Ref Range: 70 - 110 mg/dL 73 438 (H) 887 (H) 579 (H)     Results for PRESLEA, BRASCH (MRN [id number]) as of 12/08/2015 12:56   Ref. Range 12/08/2015 08:40 12/08/2015 11:25   GLUCOSE,FAST - POC Latest Ref Range: 70 - 110 mg/dL 94 615 (H)     Current A1C: 6.6% per H&P which is equivalent to average blood glucose of 143 mg/dL during the past 2-3 months.    Current hospital diabetes medications:  Correctional lispro insulin ACHS. Very resistant dose.    Total daily dose insulin requirement previous day: 12/07/2015  Lispro: 4 units    Home diabetes medications:   Metformin 850 mg 3x daily.    Diet: Diabetic consistent carb; soft solids; nutr suppl: glucerna shake for breakfast and lunch, and ensure pudding for dinner.    Goals:  Blood glucose will be within target range of 70-180 mg/dL by 37/94/3276.     Education:  ___  Refer to Diabetes Education Record             _X__  Education not indicated at this time     Sharyne Richters, RN

## 2015-12-08 NOTE — Progress Notes (Signed)
Problem: Mobility Impaired (Adult and Pediatric)  Goal: *Acute Goals and Plan of Care (Insert Text)  STG for 8.2.17 to be completed by 8.9.17 (7 days).  1. Pt will complete supine to/from sit (I) in order to complete EOB activity.  2. Pt will complete sit to/from stand transfer with RW and S in prep for ambulation.  3. Pt will ambulate 50 feet with RW and S in order to negotiate household distances.   Outcome: Progressing Towards Goal  PHYSICAL THERAPY TREATMENT     [name] (82 y.o. female)  Date: 12/08/2015  Diagnosis: Dehydration  Altered mental status Altered mental status       Precautions:     Chart, physical therapy assessment, plan of care and goals were reviewed.      ASSESSMENT:  Pt found supine in bed willing to work with PT. Upon leaving room to find nurse to disconnect pt's IV for ambulation, pt stands on her own and found attemptign to ambulate to b/s commode without AD. Pt assisted to b/s commode and instructed to have assistance OOB; questionable carryover, thus bed alarm activated at end of tx. Pt ambulated into hallway this tx able to increase distance, requiring verbal cues to stay on task. Pt returned to supine and performed therex. Pt is able to reposition self in bed, but requires increased time.     Education: therex, gait.  Progression toward goals:        Improving appropriately and progressing toward goals        Improving slowly and progressing toward goals        Not making progress toward goals and plan of care will be adjusted       PLAN:  Patient continues to benefit from skilled intervention to address the above impairments.  Continue treatment per established plan of care.  Discharge Recommendations:  SNF  Further Equipment Recommendations for Discharge:  rolling walker       SUBJECTIVE:   Patient stated ???Where are we going????      OBJECTIVE DATA SUMMARY:   Critical Behavior:  Neurologic State: Alert   Orientation Level: Oriented to person, Oriented to place, Oriented to situation  Cognition: Follows commands  Functional Mobility Training:  Bed Mobility:  Supine to Sit: Supervision;Additional time  Sit to Supine: Supervision;Additional time  Scooting: Supervision;Additional time  Transfers:  Sit to Stand: Contact guard assistance  Stand to Sit: Contact guard assistance  Balance:  Sitting: Intact  Sitting - Static: Good (unsupported)  Sitting - Dynamic: Good (unsupported)  Standing: Impaired;With support  Standing - Static: Good  Standing - Dynamic : Fair  Ambulation/Gait Training:  Distance (ft): 160 Feet (ft)  Assistive Device: Walker, rolling  Ambulation - Level of Assistance: Contact guard assistance;Stand-by asssistance  Gait Abnormalities: Decreased step clearance  Base of Support: Center of gravity altered  Speed/Cadence: Slow;Pace decreased (<100 feet/min)  Step Length: Left shortened;Right shortened     Therapeutic Exercises:   SLR, ankle pumps,heel slides X 10 bilaterally with VCs for proper technique throughout.  Pain:  Pre tx pain: 0  Post tx pain: 0  Pain Scale 1: Numeric (0 - 10)  Pain Intensity 1: 0  Activity Tolerance:   Fair  Please refer to the flowsheet for vital signs taken during this treatment.  After treatment:    Patient left in no apparent distress sitting up in chair   Patient left in no apparent distress in bed   Call bell left within reach    Nursing notified  [ ]  Caregiver present  [ ]  Bed alarm activated      Rita Ohara, PTA   Time Calculation: 23 mins     Mobility [phone] Current  CI= 1-19%.  The severity rating is based on the Level of Assistance required for Functional Mobility and ADLs.     Mobility  [id number] Goal  CI= 1-19%.  The severity rating is based on the Level of Assistance required for Functional Mobility and ADLs.

## 2015-12-11 LAB — CULTURE, BLOOD: Culture result:: NO GROWTH

## 2016-01-02 ENCOUNTER — Other Ambulatory Visit: Payer: Self-pay

## 2016-03-05 ENCOUNTER — Emergency Department: Admit: 2016-03-05 | Payer: MEDICARE | Primary: Internal Medicine

## 2016-03-05 ENCOUNTER — Inpatient Hospital Stay
Admit: 2016-03-05 | Discharge: 2016-03-09 | Disposition: A | Discharge status: Skilled Nursing Facility | Payer: MEDICARE | Attending: Family Medicine | Admitting: Family Medicine

## 2016-03-05 DIAGNOSIS — N179 Acute kidney failure, unspecified: Principal | ICD-10-CM

## 2016-03-05 LAB — URINALYSIS W/ RFLX MICROSCOPIC
Bilirubin: NEGATIVE
Blood: NEGATIVE
Glucose: NEGATIVE mg/dL
Ketone: 15 mg/dL — AB
Nitrites: NEGATIVE
Protein: NEGATIVE mg/dL
Specific gravity: 1.026 (ref 1.005–1.030)
Urobilinogen: 0.2 EU/dL (ref 0.2–1.0)
pH (UA): 5 (ref 5.0–8.0)

## 2016-03-05 LAB — URINE MICROSCOPIC ONLY
Bacteria: NEGATIVE /hpf
Hyaline cast: 0 /lpf (ref 0–2)
RBC: NEGATIVE /hpf (ref 0–5)
WBC: 0 /hpf (ref 0–5)

## 2016-03-05 LAB — CBC WITH AUTOMATED DIFF
ABS. BASOPHILS: 0 10*3/uL (ref 0.0–0.06)
ABS. EOSINOPHILS: 0 10*3/uL (ref 0.0–0.4)
ABS. LYMPHOCYTES: 0.7 10*3/uL — ABNORMAL LOW (ref 0.9–3.6)
ABS. MONOCYTES: 0.6 10*3/uL (ref 0.05–1.2)
ABS. NEUTROPHILS: 5.2 10*3/uL (ref 1.8–8.0)
BASOPHILS: 0 % (ref 0–2)
EOSINOPHILS: 0 % (ref 0–5)
HCT: 34.1 % — ABNORMAL LOW (ref 35.0–45.0)
HGB: 10.8 g/dL — ABNORMAL LOW (ref 12.0–16.0)
LYMPHOCYTES: 11 % — ABNORMAL LOW (ref 21–52)
MCH: 23.8 PG — ABNORMAL LOW (ref 24.0–34.0)
MCHC: 31.7 g/dL (ref 31.0–37.0)
MCV: 75.3 FL (ref 74.0–97.0)
MONOCYTES: 9 % (ref 3–10)
MPV: 10 FL (ref 9.2–11.8)
NEUTROPHILS: 80 % — ABNORMAL HIGH (ref 40–73)
PLATELET: 221 10*3/uL (ref 135–420)
RBC: 4.53 M/uL (ref 4.20–5.30)
RDW: 14.3 % (ref 11.6–14.5)
WBC: 6.4 10*3/uL (ref 4.6–13.2)

## 2016-03-05 LAB — METABOLIC PANEL, BASIC
Anion gap: 12 mmol/L (ref 3.0–18)
BUN/Creatinine ratio: 44 — ABNORMAL HIGH (ref 12–20)
BUN: 65 MG/DL — ABNORMAL HIGH (ref 7.0–18)
CO2: 23 mmol/L (ref 21–32)
Calcium: 9.6 MG/DL (ref 8.5–10.1)
Chloride: 103 mmol/L (ref 100–108)
Creatinine: 1.49 MG/DL — ABNORMAL HIGH (ref 0.6–1.3)
GFR est AA: 40 mL/min/{1.73_m2} — ABNORMAL LOW (ref >60)
GFR est non-AA: 33 mL/min/{1.73_m2} — ABNORMAL LOW (ref >60)
Glucose: 188 mg/dL — ABNORMAL HIGH (ref 74–99)
Potassium: 4.3 mmol/L (ref 3.5–5.5)
Sodium: 138 mmol/L (ref 136–145)

## 2016-03-05 LAB — EKG, 12 LEAD, INITIAL
Atrial Rate: 88 {beats}/min
Calculated P Axis: 75 degrees
Calculated R Axis: 60 degrees
Calculated T Axis: -74 degrees
Diagnosis: NORMAL
P-R Interval: 114 ms
Q-T Interval: 358 ms
QRS Duration: 72 ms
QTC Calculation (Bezet): 433 ms
Ventricular Rate: 88 {beats}/min

## 2016-03-05 LAB — HEPATIC FUNCTION PANEL
A-G Ratio: 0.9 (ref 0.8–1.7)
ALT (SGPT): 12 U/L — ABNORMAL LOW (ref 13–56)
AST (SGOT): 15 U/L (ref 15–37)
Albumin: 3.5 g/dL (ref 3.4–5.0)
Alk. phosphatase: 85 U/L (ref 45–117)
Bilirubin, direct: 0.2 MG/DL (ref 0.0–0.2)
Bilirubin, total: 0.5 MG/DL (ref 0.2–1.0)
Globulin: 3.8 g/dL (ref 2.0–4.0)
Protein, total: 7.3 g/dL (ref 6.4–8.2)

## 2016-03-05 LAB — CARDIAC PANEL,(CK, CKMB & TROPONIN)
CK - MB: 2.4 ng/ml (ref <3.6)
CK-MB Index: 6.2 % — ABNORMAL HIGH (ref 0.0–4.0)
CK: 39 U/L (ref 26–192)
Troponin-I, QT: 0.03 NG/ML (ref 0.0–0.045)

## 2016-03-05 LAB — LIPASE: Lipase: 163 U/L (ref 73–393)

## 2016-03-05 LAB — MAGNESIUM: Magnesium: 1.9 mg/dL (ref 1.6–2.6)

## 2016-03-05 LAB — POC LACTIC ACID: Lactic Acid (POC): 1.2 mmol/L (ref 0.4–2.0)

## 2016-03-05 LAB — EKG 12-LEAD
Atrial Rate: 88 {beats}/min
Diagnosis: NORMAL
P Axis: 75 degrees
P-R Interval: 114 ms
Q-T Interval: 358 ms
QRS Duration: 72 ms
QTc Calculation (Bazett): 433 ms
R Axis: 60 degrees
T Axis: -74 degrees
Ventricular Rate: 88 {beats}/min

## 2016-03-05 MED ORDER — SODIUM CHLORIDE 0.9% BOLUS IV
0.9 % | Freq: Once | INTRAVENOUS | Status: AC
Start: 2016-03-05 — End: 2016-03-06

## 2016-03-05 MED ORDER — SODIUM CHLORIDE 0.9% BOLUS IV
0.9 % | Freq: Once | INTRAVENOUS | Status: DC
Start: 2016-03-05 — End: 2016-03-05

## 2016-03-05 MED ORDER — IOPAMIDOL 61 % IV SOLN
300 mg iodine /mL (61 %) | Freq: Once | INTRAVENOUS | Status: AC
Start: 2016-03-05 — End: 2016-03-05
  Administered 2016-03-05: 21:00:00 via INTRAVENOUS

## 2016-03-05 MED FILL — SODIUM CHLORIDE 0.9 % IV: INTRAVENOUS | Qty: 1000

## 2016-03-05 MED FILL — SODIUM CHLORIDE 0.9 % IV: INTRAVENOUS | Qty: 2000

## 2016-03-05 MED FILL — ISOVUE-300  61 % INTRAVENOUS SOLUTION: 300 mg iodine /mL (61 %) | INTRAVENOUS | Qty: 100

## 2016-03-05 NOTE — ED Provider Notes (Signed)
HPI Comments: 2:15 PM [name] Juarez is a 82 y.o. female with h/o DM and HTN who presents to ED via EMS from [facility]. The patient was at [facility] for poor PO intake over the last 2-3 weeks. She states she has vomited 2 or 3 times, the last time at dinner yesterday. The patient denies any pain. I received a from PCP stating they had seen her a couple of weeks ago for nausea and vomiting then today she presented unimproved and dehydrated so he wanted her evaluated further and possibly admitted into hospital.     The patient is a poor historian due to the patient's clinical condition.    PCP: [name], MD      The history is provided by the patient. The history is limited by the condition of the patient.        Past Medical History:   Diagnosis Date   ??? Arthritis    ??? Diabetes ([place])    ??? Hypertension        Past Surgical History:   Procedure Laterality Date   ??? HX GYN      hysterectomy         No family history on file.    Social History     Social History   ??? Marital status: SINGLE     Spouse name: N/A   ??? Number of children: N/A   ??? Years of education: N/A     Occupational History   ??? Not on file.     Social History Main Topics   ??? Smoking status: Never Smoker   ??? Smokeless tobacco: Never Used   ??? Alcohol use No   ??? Drug use: Not on file   ??? Sexual activity: Not Currently     Other Topics Concern   ??? Not on file     Social History Narrative   ??? No narrative on file         ALLERGIES: Review of patient's allergies indicates no known allergies.    Review of Systems   Unable to perform ROS: Acuity of condition       Vitals:    03/05/16 1714   BP: 142/55   Pulse: 86   Resp: 16   Temp: 97.8 ??F (36.6 ??C)   SpO2: 94%            Physical Exam   Constitutional: She is oriented to person, place, and time. She appears well-developed and well-nourished.   Frail appearing  Elderly   HENT:   Head: Normocephalic and atraumatic.    Eyes: Conjunctivae and EOM are normal. Pupils are equal, round, and reactive to light.   Neck: Normal range of motion. Neck supple.   Cardiovascular: Normal rate and regular rhythm.    Pulmonary/Chest: Effort normal and breath sounds normal.   Abdominal: Soft. Bowel sounds are normal. She exhibits no distension. There is no tenderness. There is no rebound.   Musculoskeletal: Normal range of motion.   Neurological: She is alert and oriented to person, place, and time. She has normal strength. No cranial nerve deficit. Coordination normal. GCS eye subscore is 4. GCS verbal subscore is 5. GCS motor subscore is 6.   Normal upper and lower extremity strength and sensation.    Skin: Skin is warm and dry.   Psychiatric: She has a normal mood and affect. Thought content normal.        MDM  Number of Diagnoses or Management Options  Acute kidney injury [facility]):   Diagnosis management comments: 82 y.o. Female who presents from [facility] for nausea and vomiting. The patient was found to have an acute kidney injury that will require further attention. The patient will be admitted.       ED Course       Procedures             Vitals:  Patient Vitals for the past 12 hrs:   Temp Pulse Resp BP SpO2   03/05/16 1714 97.8 ??F (36.6 ??C) 86 16 142/55 94 %       Medications Ordered:  Medications   sodium chloride 0.9 % bolus infusion 1,000 mL (not administered)   sodium chloride 0.9 % bolus infusion 1,000 mL (not administered)   iopamidol (ISOVUE 300) 61 % contrast injection 50-100 mL (60 mL IntraVENous Given 03/05/16 1700)       Lab Findings:  Recent Results (from the past 12 hour(s))   CBC WITH AUTOMATED DIFF    Collection Time: 03/05/16  2:52 PM   Result Value Ref Range    WBC 6.4 4.6 - 13.2 K/uL    RBC 4.53 4.20 - 5.30 M/uL    HGB 10.8 (L) 12.0 - 16.0 g/dL    HCT 34.1 (L) 35.0 - 45.0 %    MCV 75.3 74.0 - 97.0 FL    MCH 23.8 (L) 24.0 - 34.0 PG    MCHC 31.7 31.0 - 37.0 g/dL    RDW 14.3 11.6 - 14.5 %     PLATELET 221 135 - 420 K/uL    MPV 10.0 9.2 - 11.8 FL    NEUTROPHILS 80 (H) 40 - 73 %    LYMPHOCYTES 11 (L) 21 - 52 %    MONOCYTES 9 3 - 10 %    EOSINOPHILS 0 0 - 5 %    BASOPHILS 0 0 - 2 %    ABS. NEUTROPHILS 5.2 1.8 - 8.0 K/UL    ABS. LYMPHOCYTES 0.7 (L) 0.9 - 3.6 K/UL    ABS. MONOCYTES 0.6 0.05 - 1.2 K/UL    ABS. EOSINOPHILS 0.0 0.0 - 0.4 K/UL    ABS. BASOPHILS 0.0 0.0 - 0.06 K/UL    DF AUTOMATED     METABOLIC PANEL, BASIC    Collection Time: 03/05/16  2:52 PM   Result Value Ref Range    Sodium 138 136 - 145 mmol/L    Potassium 4.3 3.5 - 5.5 mmol/L    Chloride 103 100 - 108 mmol/L    CO2 23 21 - 32 mmol/L    Anion gap 12 3.0 - 18 mmol/L    Glucose 188 (H) 74 - 99 mg/dL    BUN 65 (H) 7.0 - 18 MG/DL    Creatinine 1.49 (H) 0.6 - 1.3 MG/DL    BUN/Creatinine ratio 44 (H) 12 - 20      GFR est AA 40 (L) >60 ml/min/1.37m    GFR est non-AA 33 (L) >60 ml/min/1.752m   Calcium 9.6 8.5 - 10.1 MG/DL   CARDIAC PANEL,(CK, CKMB & TROPONIN)    Collection Time: 03/05/16  2:52 PM   Result Value Ref Range    CK 39 26 - 192 U/L    CK - MB 2.4 <3.6 ng/ml    CK-MB Index 6.2 (H) 0.0 - 4.0 %    Troponin-I, Qt. 0.03 0.0 - 0.045 NG/ML   LIPASE    Collection Time: 03/05/16  2:52 PM   Result Value Ref  Range    Lipase 163 73 - 393 U/L   MAGNESIUM    Collection Time: 03/05/16  2:52 PM   Result Value Ref Range    Magnesium 1.9 1.6 - 2.6 mg/dL   HEPATIC FUNCTION PANEL    Collection Time: 03/05/16  2:52 PM   Result Value Ref Range    Protein, total 7.3 6.4 - 8.2 g/dL    Albumin 3.5 3.4 - 5.0 g/dL    Globulin 3.8 2.0 - 4.0 g/dL    A-G Ratio 0.9 0.8 - 1.7      Bilirubin, total 0.5 0.2 - 1.0 MG/DL    Bilirubin, direct 0.2 0.0 - 0.2 MG/DL    Alk. phosphatase 85 45 - 117 U/L    AST (SGOT) 15 15 - 37 U/L    ALT (SGPT) 12 (L) 13 - 56 U/L   POC LACTIC ACID    Collection Time: 03/05/16  3:06 PM   Result Value Ref Range    Lactic Acid (POC) 1.2 0.4 - 2.0 mmol/L   EKG, 12 LEAD, INITIAL    Collection Time: 03/05/16  4:06 PM   Result Value Ref Range     Ventricular Rate 88 BPM    Atrial Rate 88 BPM    P-R Interval 114 ms    QRS Duration 72 ms    Q-T Interval 358 ms    QTC Calculation (Bezet) 433 ms    Calculated P Axis 75 degrees    Calculated R Axis 60 degrees    Calculated T Axis -74 degrees    Diagnosis       Normal sinus rhythm  Minimal voltage criteria for LVH, may be normal variant  Anterior infarct , age undetermined  T wave abnormality, consider inferolateral ischemia  Abnormal ECG  When compared with ECG of 22-Nov-2015 07:33,  Inverted T waves have replaced nonspecific T wave abnormality in Inferior   leads  T wave inversion more evident in Anterior leads  Confirmed by Elita Quick [id number]) on 03/05/2016 5:29:46 PM     URINALYSIS W/ RFLX MICROSCOPIC    Collection Time: 03/05/16  6:20 PM   Result Value Ref Range    Color YELLOW      Appearance CLEAR      Specific gravity 1.026 1.005 - 1.030      pH (UA) 5.0 5.0 - 8.0      Protein NEGATIVE  NEG mg/dL    Glucose NEGATIVE  NEG mg/dL    Ketone 15 (A) NEG mg/dL    Bilirubin NEGATIVE  NEG      Blood NEGATIVE  NEG      Urobilinogen 0.2 0.2 - 1.0 EU/dL    Nitrites NEGATIVE  NEG      Leukocyte Esterase TRACE (A) NEG     URINE MICROSCOPIC ONLY    Collection Time: 03/05/16  6:20 PM   Result Value Ref Range    WBC 0 to 1 0 - 5 /hpf    RBC NEGATIVE  0 - 5 /hpf    Epithelial cells FEW 0 - 5 /lpf    Bacteria NEGATIVE  NEG /hpf    Hyaline cast 0 to 3 0 - 2 /lpf       EKG Interpretation by ED physician:  1606 NSR at a rate of 88bpm. T wave and diffuse T wave inversions.    X-ray, CT or radiology findings or impressions:  Xr Chest Sngl V  Impression: 1.  Hyperinflated lungs.     Ct  Head Wo Cont  IMPRESSION: No acute intracranial abnormality. Note: If clinical concern of acute stroke, MRI with diffusion weighted images is recommended for better evaluation. Mild microvascular disease. Right mastoid effusion. Thank you for your referral.     Ct Abd Pelv W Cont   IMPRESSION: 1. No acute diagnostic abnormality in abdomen or pelvis. 2. Severe compression deformity of T11 with moderate retropulsion and cord compression, indeterminate for age. 3. Diverticulosis coli. Mild anasarca. Thank you for this referral.       Progress notes, consult notes, or additional procedure notes:  Consult:  Discussed care with Dr. Brion Aliment, PCP. Standard discussion; including history of patient???s chief complaint, available diagnostic results, and treatment course. The patient was seen a couple of weeks ago for nausea and vomiting then today she presented unimproved and dehydrated so he wanted her evaluated further and possibly admitted into hospital.   2:25 PM, 03/05/2016     Consult:  Discussed care with Dr. Toribio Harbour, family practice medicine at [facility]. Standard discussion; including history of patient???s chief complaint, available diagnostic results, and treatment course. Agrees with plan for admission.   7:48 PM, 03/05/2016     Reevaluation of the patient:   Resting comfortably at 7:30 pm. No complaints.     Diagnosis:   1. Acute kidney injury ([place])        Disposition: Admit    Follow-up Information     None           Patient's Medications   Start Taking    No medications on file   Continue Taking    FERROUS SULFATE (IRON) 325 MG (65 MG IRON) EC TABLET    Take 325 mg by mouth three (3) times daily (with meals).    TRAMADOL (ULTRAM) 50 MG TABLET    Take 50 mg by mouth every six (6) hours as needed for Pain.   These Medications have changed    No medications on file   Stop Taking    No medications on file     [place] acting as a scribe for and in the presence of Liz Malady, MD      March 05, 2016 at 2:18 PM       Provider Attestation:      I personally performed the services described in the documentation, reviewed the documentation, as recorded by the scribe in my presence, and  it accurately and completely records my words and actions. March 05, 2016 at 2:18 PM - Liz Malady, MD

## 2016-03-05 NOTE — ED Notes (Signed)
Patient is asleep easily awaked and in no acute distress IVF complete.

## 2016-03-05 NOTE — H&P (Signed)
Senior Addendum to History and Physical  [facility]      [name] MRN: [id number]  CSN: [id number]    Date of Birth: [birth year]  Age: 82 y.o.  Sex: female      DOA: 03/05/2016       HPI:     [name] Juarez is a 82 y.o. female with PMH HTN, GERD, anemia and hx of UTI. Patient presents today to the ED at the referral from her PCP. History provided from outpatient notes, ER physician, and ED notes. Patient is a poor historian, no family present. Patient went to see her PCP today and was noted to be dehydrated. Patient's daughter would like patient to be admitted for SNF placement because she is having difficulity taking care of her mom and has noted that Mom has poor PO intake. Patient has had multiple admissions for similar concern. In the past patient has not met criteria for SNF placement because she has no skilled need that needs to be worked on. And was discharged home with home health. Patient's daughter open to speaking with hospice   Patient reports not being hungry, her weight has been stable in August 2017. However per daughter in the last three weeks she has only  Had sips of water and often vomits her meds.     Patient denies fever, chills, chest pain, cough, abdominal pain, vomiting, nausea.     Physical Exam:     Physical Exam:  General:  Alert, weak  HEENT: Conjunctiva pink, sclera anicteric.  PERRL. EOMI.  Pharynx dry, nonerythematous. Dry mucous membranes.    CV:  RRR, no murmurs.  PMI not displaced. No visible pulsations or thrills.    RESP:  Unlabored breathing.  Lungs clear to auscultation. no wheeze, rales, or rhonchi.  Equal expansion bilaterally.    ABD:  Soft, nontender, nondistended.  Normoactive bowel sounds.  No suprapubic tenderness.    MS:  No joint deformity or instability.  No atrophy.  Neuro:  4/5 strength bilateral upper extremities and lower extremities. Sensation grossly intact.   Ext:  No edema.  2+ radial and dp pulses bilaterally.   Skin:  No rashes, lesions, or ulcers.  Good turgor.    Recent Results (from the past 12 hour(s))   CBC WITH AUTOMATED DIFF    Collection Time: 03/05/16  2:52 PM   Result Value Ref Range    WBC 6.4 4.6 - 13.2 K/uL    RBC 4.53 4.20 - 5.30 M/uL    HGB 10.8 (L) 12.0 - 16.0 g/dL    HCT 34.1 (L) 35.0 - 45.0 %    MCV 75.3 74.0 - 97.0 FL    MCH 23.8 (L) 24.0 - 34.0 PG    MCHC 31.7 31.0 - 37.0 g/dL    RDW 14.3 11.6 - 14.5 %    PLATELET 221 135 - 420 K/uL    MPV 10.0 9.2 - 11.8 FL    NEUTROPHILS 80 (H) 40 - 73 %    LYMPHOCYTES 11 (L) 21 - 52 %    MONOCYTES 9 3 - 10 %    EOSINOPHILS 0 0 - 5 %    BASOPHILS 0 0 - 2 %    ABS. NEUTROPHILS 5.2 1.8 - 8.0 K/UL    ABS. LYMPHOCYTES 0.7 (L) 0.9 - 3.6 K/UL    ABS. MONOCYTES 0.6 0.05 - 1.2 K/UL    ABS. EOSINOPHILS 0.0 0.0 - 0.4 K/UL    ABS. BASOPHILS 0.0 0.0 -  0.06 K/UL    DF AUTOMATED     METABOLIC PANEL, BASIC    Collection Time: 03/05/16  2:52 PM   Result Value Ref Range    Sodium 138 136 - 145 mmol/L    Potassium 4.3 3.5 - 5.5 mmol/L    Chloride 103 100 - 108 mmol/L    CO2 23 21 - 32 mmol/L    Anion gap 12 3.0 - 18 mmol/L    Glucose 188 (H) 74 - 99 mg/dL    BUN 65 (H) 7.0 - 18 MG/DL    Creatinine 1.49 (H) 0.6 - 1.3 MG/DL    BUN/Creatinine ratio 44 (H) 12 - 20      GFR est AA 40 (L) >60 ml/min/1.17m    GFR est non-AA 33 (L) >60 ml/min/1.753m   Calcium 9.6 8.5 - 10.1 MG/DL   CARDIAC PANEL,(CK, CKMB & TROPONIN)    Collection Time: 03/05/16  2:52 PM   Result Value Ref Range    CK 39 26 - 192 U/L    CK - MB 2.4 <3.6 ng/ml    CK-MB Index 6.2 (H) 0.0 - 4.0 %    Troponin-I, Qt. 0.03 0.0 - 0.045 NG/ML   LIPASE    Collection Time: 03/05/16  2:52 PM   Result Value Ref Range    Lipase 163 73 - 393 U/L   MAGNESIUM    Collection Time: 03/05/16  2:52 PM   Result Value Ref Range    Magnesium 1.9 1.6 - 2.6 mg/dL   HEPATIC FUNCTION PANEL    Collection Time: 03/05/16  2:52 PM   Result Value Ref Range    Protein, total 7.3 6.4 - 8.2 g/dL    Albumin 3.5 3.4 - 5.0 g/dL    Globulin 3.8 2.0 - 4.0 g/dL     A-G Ratio 0.9 0.8 - 1.7      Bilirubin, total 0.5 0.2 - 1.0 MG/DL    Bilirubin, direct 0.2 0.0 - 0.2 MG/DL    Alk. phosphatase 85 45 - 117 U/L    AST (SGOT) 15 15 - 37 U/L    ALT (SGPT) 12 (L) 13 - 56 U/L   POC LACTIC ACID    Collection Time: 03/05/16  3:06 PM   Result Value Ref Range    Lactic Acid (POC) 1.2 0.4 - 2.0 mmol/L   EKG, 12 LEAD, INITIAL    Collection Time: 03/05/16  4:06 PM   Result Value Ref Range    Ventricular Rate 88 BPM    Atrial Rate 88 BPM    P-R Interval 114 ms    QRS Duration 72 ms    Q-T Interval 358 ms    QTC Calculation (Bezet) 433 ms    Calculated P Axis 75 degrees    Calculated R Axis 60 degrees    Calculated T Axis -74 degrees    Diagnosis       Normal sinus rhythm  Minimal voltage criteria for LVH, may be normal variant  Anterior infarct , age undetermined  T wave abnormality, consider inferolateral ischemia  Abnormal ECG  When compared with ECG of 22-Nov-2015 07:33,  Inverted T waves have replaced nonspecific T wave abnormality in Inferior   leads  T wave inversion more evident in Anterior leads  Confirmed by PaElita [id number]) on 03/05/2016 5:29:46 PM     URINALYSIS W/ RFLX MICROSCOPIC    Collection Time: 03/05/16  6:20 PM   Result Value Ref Range    Color YELLOW  Appearance CLEAR      Specific gravity 1.026 1.005 - 1.030      pH (UA) 5.0 5.0 - 8.0      Protein NEGATIVE  NEG mg/dL    Glucose NEGATIVE  NEG mg/dL    Ketone 15 (A) NEG mg/dL    Bilirubin NEGATIVE  NEG      Blood NEGATIVE  NEG      Urobilinogen 0.2 0.2 - 1.0 EU/dL    Nitrites NEGATIVE  NEG      Leukocyte Esterase TRACE (A) NEG     URINE MICROSCOPIC ONLY    Collection Time: 03/05/16  6:20 PM   Result Value Ref Range    WBC 0 to 1 0 - 5 /hpf    RBC NEGATIVE  0 - 5 /hpf    Epithelial cells FEW 0 - 5 /lpf    Bacteria NEGATIVE  NEG /hpf    Hyaline cast 0 to 3 0 - 2 /lpf       CT pelvis: 10/31: . No acute diagnostic abnormality in abdomen or pelvis.  ?? Severe compression deformity of T11 with moderate retropulsion and cord   compression, indeterminate for age. Diverticulosis coli. Mild anasarca    CT head:10/31: If clinical concern of acute stroke, MRI with diffusion weighted images is  recommended for better evaluation. Mild microvascular disease.   Right mastoid effusion.    CXR: 10/31:  Hyperinflated lungs    Recent Results (from the past 12 hour(s))   CBC WITH AUTOMATED DIFF    Collection Time: 03/05/16  2:52 PM   Result Value Ref Range    WBC 6.4 4.6 - 13.2 K/uL    RBC 4.53 4.20 - 5.30 M/uL    HGB 10.8 (L) 12.0 - 16.0 g/dL    HCT 34.1 (L) 35.0 - 45.0 %    MCV 75.3 74.0 - 97.0 FL    MCH 23.8 (L) 24.0 - 34.0 PG    MCHC 31.7 31.0 - 37.0 g/dL    RDW 14.3 11.6 - 14.5 %    PLATELET 221 135 - 420 K/uL    MPV 10.0 9.2 - 11.8 FL    NEUTROPHILS 80 (H) 40 - 73 %    LYMPHOCYTES 11 (L) 21 - 52 %    MONOCYTES 9 3 - 10 %    EOSINOPHILS 0 0 - 5 %    BASOPHILS 0 0 - 2 %    ABS. NEUTROPHILS 5.2 1.8 - 8.0 K/UL    ABS. LYMPHOCYTES 0.7 (L) 0.9 - 3.6 K/UL    ABS. MONOCYTES 0.6 0.05 - 1.2 K/UL    ABS. EOSINOPHILS 0.0 0.0 - 0.4 K/UL    ABS. BASOPHILS 0.0 0.0 - 0.06 K/UL    DF AUTOMATED     METABOLIC PANEL, BASIC    Collection Time: 03/05/16  2:52 PM   Result Value Ref Range    Sodium 138 136 - 145 mmol/L    Potassium 4.3 3.5 - 5.5 mmol/L    Chloride 103 100 - 108 mmol/L    CO2 23 21 - 32 mmol/L    Anion gap 12 3.0 - 18 mmol/L    Glucose 188 (H) 74 - 99 mg/dL    BUN 65 (H) 7.0 - 18 MG/DL    Creatinine 1.49 (H) 0.6 - 1.3 MG/DL    BUN/Creatinine ratio 44 (H) 12 - 20      GFR est AA 40 (L) >60 ml/min/1.24m    GFR est non-AA 33 (L) >  60 ml/min/1.54m    Calcium 9.6 8.5 - 10.1 MG/DL   CARDIAC PANEL,(CK, CKMB & TROPONIN)    Collection Time: 03/05/16  2:52 PM   Result Value Ref Range    CK 39 26 - 192 U/L    CK - MB 2.4 <3.6 ng/ml    CK-MB Index 6.2 (H) 0.0 - 4.0 %    Troponin-I, Qt. 0.03 0.0 - 0.045 NG/ML   LIPASE    Collection Time: 03/05/16  2:52 PM   Result Value Ref Range    Lipase 163 73 - 393 U/L   MAGNESIUM    Collection Time: 03/05/16  2:52 PM    Result Value Ref Range    Magnesium 1.9 1.6 - 2.6 mg/dL   HEPATIC FUNCTION PANEL    Collection Time: 03/05/16  2:52 PM   Result Value Ref Range    Protein, total 7.3 6.4 - 8.2 g/dL    Albumin 3.5 3.4 - 5.0 g/dL    Globulin 3.8 2.0 - 4.0 g/dL    A-G Ratio 0.9 0.8 - 1.7      Bilirubin, total 0.5 0.2 - 1.0 MG/DL    Bilirubin, direct 0.2 0.0 - 0.2 MG/DL    Alk. phosphatase 85 45 - 117 U/L    AST (SGOT) 15 15 - 37 U/L    ALT (SGPT) 12 (L) 13 - 56 U/L   POC LACTIC ACID    Collection Time: 03/05/16  3:06 PM   Result Value Ref Range    Lactic Acid (POC) 1.2 0.4 - 2.0 mmol/L   EKG, 12 LEAD, INITIAL    Collection Time: 03/05/16  4:06 PM   Result Value Ref Range    Ventricular Rate 88 BPM    Atrial Rate 88 BPM    P-R Interval 114 ms    QRS Duration 72 ms    Q-T Interval 358 ms    QTC Calculation (Bezet) 433 ms    Calculated P Axis 75 degrees    Calculated R Axis 60 degrees    Calculated T Axis -74 degrees    Diagnosis       Normal sinus rhythm  Minimal voltage criteria for LVH, may be normal variant  Anterior infarct , age undetermined  T wave abnormality, consider inferolateral ischemia  Abnormal ECG  When compared with ECG of 22-Nov-2015 07:33,  Inverted T waves have replaced nonspecific T wave abnormality in Inferior   leads  T wave inversion more evident in Anterior leads  Confirmed by PElita Quick([id number]) on 03/05/2016 5:29:46 PM     URINALYSIS W/ RFLX MICROSCOPIC    Collection Time: 03/05/16  6:20 PM   Result Value Ref Range    Color YELLOW      Appearance CLEAR      Specific gravity 1.026 1.005 - 1.030      pH (UA) 5.0 5.0 - 8.0      Protein NEGATIVE  NEG mg/dL    Glucose NEGATIVE  NEG mg/dL    Ketone 15 (A) NEG mg/dL    Bilirubin NEGATIVE  NEG      Blood NEGATIVE  NEG      Urobilinogen 0.2 0.2 - 1.0 EU/dL    Nitrites NEGATIVE  NEG      Leukocyte Esterase TRACE (A) NEG     URINE MICROSCOPIC ONLY    Collection Time: 03/05/16  6:20 PM   Result Value Ref Range    WBC 0 to 1 0 - 5 /hpf  RBC NEGATIVE  0 - 5 /hpf     Epithelial cells FEW 0 - 5 /lpf    Bacteria NEGATIVE  NEG /hpf    Hyaline cast 0 to 3 0 - 2 /lpf       Assessment/Plan:   82 y.o. female with PMH HTN, GERD, anemia hx of UTI now admitted for Acute kidney Injury     Acute kidney injury: Cr. 1.49. Baseline 0.7. Patient given 2 L bolus in the ED  - IV hydration with maintainance s/p 2 L bolus with 75cc/hr  - daily labs bmp  - FU albumin level  - fall precautions    Poor PO intake- chronic  - ensure/glucerna with all meals  - case management  - pt/ot evalaute for possible placement  - albumin level  - consult nutrition  - encourage PO  - consult hospice/palliave in the am    Hx of iron def anemia: labs in 2016 showed TIBC 232, fe- 25, iron sat 11%  - daily cbc  - will continue home iron supplementation    HTN: well controlled  - will hold diuretic htn meds  - monitor vital signs    GERD  - continue home pepcid 20m daily  ??  DM type 2: last A1c- 6.6 7/17 diet  Controlled not on meds outpatient  - daily bmp  - if glucose elevated will initiate SSI    ??    Diet: regular  DVT Prophylaxis: lovenox  Code Status: Full  Point of Contact: Daughter      NRaina Mina MD PGY-3  [facility]  03/05/2016, 9:43 PM

## 2016-03-05 NOTE — ED Triage Notes (Signed)
Per EMS, "Went to [facility] for poor feeding. Patient has not been eating or drinking for 2 - 3 weeks."

## 2016-03-05 NOTE — ED Notes (Signed)
Report given to Betty Ann, RN

## 2016-03-06 ENCOUNTER — Inpatient Hospital Stay: Admit: 2016-03-06 | Payer: MEDICARE | Primary: Internal Medicine

## 2016-03-06 LAB — CBC WITH AUTOMATED DIFF
ABS. BASOPHILS: 0 10*3/uL (ref 0.0–0.06)
ABS. EOSINOPHILS: 0 10*3/uL (ref 0.0–0.4)
ABS. LYMPHOCYTES: 1 10*3/uL (ref 0.9–3.6)
ABS. MONOCYTES: 0.6 10*3/uL (ref 0.05–1.2)
ABS. NEUTROPHILS: 6.5 10*3/uL (ref 1.8–8.0)
BASOPHILS: 0 % (ref 0–2)
EOSINOPHILS: 0 % (ref 0–5)
HCT: 34.7 % — ABNORMAL LOW (ref 35.0–45.0)
HGB: 11 g/dL — ABNORMAL LOW (ref 12.0–16.0)
LYMPHOCYTES: 12 % — ABNORMAL LOW (ref 21–52)
MCH: 23.8 PG — ABNORMAL LOW (ref 24.0–34.0)
MCHC: 31.7 g/dL (ref 31.0–37.0)
MCV: 74.9 FL (ref 74.0–97.0)
MONOCYTES: 7 % (ref 3–10)
MPV: 9.9 FL (ref 9.2–11.8)
NEUTROPHILS: 81 % — ABNORMAL HIGH (ref 40–73)
PLATELET: 188 10*3/uL (ref 135–420)
RBC: 4.63 M/uL (ref 4.20–5.30)
RDW: 14.4 % (ref 11.6–14.5)
WBC: 8.1 10*3/uL (ref 4.6–13.2)

## 2016-03-06 LAB — METABOLIC PANEL, BASIC
Anion gap: 10 mmol/L (ref 3.0–18)
BUN/Creatinine ratio: 34 — ABNORMAL HIGH (ref 12–20)
BUN: 33 MG/DL — ABNORMAL HIGH (ref 7.0–18)
CO2: 24 mmol/L (ref 21–32)
Calcium: 9.4 MG/DL (ref 8.5–10.1)
Chloride: 109 mmol/L — ABNORMAL HIGH (ref 100–108)
Creatinine: 0.96 MG/DL (ref 0.6–1.3)
GFR est AA: >60 mL/min/{1.73_m2} (ref >60)
GFR est non-AA: 54 mL/min/{1.73_m2} — ABNORMAL LOW (ref >60)
Glucose: 170 mg/dL — ABNORMAL HIGH (ref 74–99)
Potassium: 3.9 mmol/L (ref 3.5–5.5)
Sodium: 143 mmol/L (ref 136–145)

## 2016-03-06 LAB — TSH 3RD GENERATION: TSH: 0.54 u[IU]/mL (ref 0.36–3.74)

## 2016-03-06 LAB — ALBUMIN: Albumin: 3.5 g/dL (ref 3.4–5.0)

## 2016-03-06 MED ORDER — FERROUS SULFATE 325 MG (65 MG ELEMENTAL IRON) TAB
325 mg (65 mg iron) | Freq: Three times a day (TID) | ORAL | Status: DC
Start: 2016-03-06 — End: 2016-03-09
  Administered 2016-03-06 – 2016-03-09 (×11): via ORAL

## 2016-03-06 MED ORDER — DOCUSATE SODIUM 100 MG CAP
100 mg | Freq: Every day | ORAL | Status: DC
Start: 2016-03-06 — End: 2016-03-09
  Administered 2016-03-06 – 2016-03-09 (×4): via ORAL

## 2016-03-06 MED ORDER — ENOXAPARIN 40 MG/0.4 ML SUB-Q SYRINGE
40 mg/0.4 mL | SUBCUTANEOUS | Status: DC
Start: 2016-03-06 — End: 2016-03-06

## 2016-03-06 MED ORDER — FAMOTIDINE 20 MG TAB
20 mg | Freq: Every evening | ORAL | Status: DC
Start: 2016-03-06 — End: 2016-03-09
  Administered 2016-03-07 – 2016-03-09 (×3): via ORAL

## 2016-03-06 MED ORDER — AMLODIPINE 10 MG TAB
10 mg | Freq: Every day | ORAL | Status: DC
Start: 2016-03-06 — End: 2016-03-05

## 2016-03-06 MED ORDER — FLU VACCINE QV 2017-18 (36 MOS+)(PF) 60 MCG (15 MCG X 4)/0.5 ML IM SYRINGE
60 mcg (15 mcg x 4)/0.5 mL | INTRAMUSCULAR | Status: AC
Start: 2016-03-06 — End: 2016-03-09
  Administered 2016-03-09: 18:00:00 via INTRAMUSCULAR

## 2016-03-06 MED ORDER — PHARMACY INFORMATION NOTE
Status: AC
Start: 2016-03-06 — End: 2016-03-06
  Administered 2016-03-06: 21:00:00

## 2016-03-06 MED ORDER — SODIUM CHLORIDE 0.9 % IV
INTRAVENOUS | Status: DC
Start: 2016-03-06 — End: 2016-03-07
  Administered 2016-03-07: 10:00:00 via INTRAVENOUS

## 2016-03-06 MED ORDER — HYDRALAZINE 10 MG TAB
10 mg | Freq: Two times a day (BID) | ORAL | Status: DC
Start: 2016-03-06 — End: 2016-03-05

## 2016-03-06 MED FILL — DOCUSATE SODIUM 100 MG CAP: 100 mg | ORAL | Qty: 1

## 2016-03-06 MED FILL — FAMOTIDINE 20 MG TAB: 20 mg | ORAL | Qty: 1

## 2016-03-06 MED FILL — FERROUS SULFATE 325 MG (65 MG ELEMENTAL IRON) TAB: 325 mg (65 mg iron) | ORAL | Qty: 1

## 2016-03-06 MED FILL — SODIUM CHLORIDE 0.9 % IV: INTRAVENOUS | Qty: 1000

## 2016-03-06 MED FILL — ENOXAPARIN 40 MG/0.4 ML SUB-Q SYRINGE: 40 mg/0.4 mL | SUBCUTANEOUS | Qty: 0.4

## 2016-03-06 MED FILL — PHARMACY INFORMATION NOTE: Qty: 1

## 2016-03-06 NOTE — ED Notes (Signed)
[name]isa [facility] [phone].) called about pt and asked if Next of Kin was notified. Pt MD office that sent pt over told hospice they did not reach out to the family and that our hospital was to reach out. Pt NOK is DTE Energy CompanyMadie Juarez [phone])

## 2016-03-06 NOTE — Progress Notes (Signed)
*  ATTENTION:  This note has been created by a medical student for educational purposes only.  Please do not refer to the content of this note for clinical decision-making, billing, or other purposes.  Please see attending physician???s note to obtain clinical information on this patient.*                                     Med Student Progress Note  [facility]       [name], [name] Juarez MRN: [id number]  CSN: [id number]   Date of Birth: [birth year] Age: 82 Sex: F   DOA: 03/05/2016 LOS: 10h                  Subjective:     Acute events: Per nursing, no acute events overnight.  Throughout the night the pt was complaining of being thirsty, made 2 wet diapers.  Had an episode of dry heaving, no emesis.  Drank a few sips of apple juice in front of me and it stayed down.  Pt is awake and alert, answering questions.  Knows her name and where she is, but cannot remember the year or the president. She denies any pain.      Pt unable to provide ROS 2/2 mental status.    Objective:      Vitals: Temp 97.4, HR 85, BP 126/51, RR 14, 100% RA    Physical Exam:   General: Awake, alert.  Frail, emaciated.  Oriented to person and place, not time or president.  HEENT: NCAT, conjunctiva pink, sclera non icteric.  Dry mucous membranes.  CV: RRR, SEM  Resp: Lungs CTAB  Abdomen: Soft, nontender, non distended.  Extremities: warm, well-perfused, no edema.    Lab/Data Reviewed:  CBC, BMP, TSH, and Albumin pending this morning.              Assessment/Plan   82 y/o female with PMH of HTN, GERD, anemia, and recurrent UTI admitted for AKI.    AKI:  -Continue NS @75   -Follow BMP + Albumin    Malnourishment:  -Encourage PO intake  -Pt to have Ensure at all meals  -PT/OT eval  -Consult hospice today per daughter's wishes    HTN:  -Holding medications to reduce diuresis  -Monitor vital signs    Gerd:  -Famotidine 20mg       Dispo:  -Discuss with daughter today; discuss her feelings regarding hospice as she mentioned yesterday.     Diet: Regular   DVT Prophylaxis: SCD  Code Status: Full  Point of Contact: Daughter    Disposition: Awaiting bed    Sallyanne HaversMary Rose Gaylor, MS3

## 2016-03-06 NOTE — Progress Notes (Addendum)
Intern Progress Note  [facility]       [name] MRN: [id number]  CSN: [id number]    Date of Birth: 12-Aug-1921  Age: 82 y.o.  Sex: female    DOA: 03/05/2016 LOS:  LOS: 1 day                    Subjective:     Acute events: Pt resting in bed. No complaints. Pt says she feels pretty good. No acute events overnight. Pt says she is not hungry but will eat.     Review of Systems   Respiratory: Positive for sputum production. Negative for cough and shortness of breath.    Cardiovascular: Negative for chest pain, palpitations and leg swelling.   Gastrointestinal: Negative for abdominal pain, nausea and vomiting.   Genitourinary: Negative for dysuria and hematuria.   Neurological: Negative for dizziness and headaches.       Objective:      Patient Vitals for the past 24 hrs:   Temp Pulse Resp BP SpO2   03/06/16 0830 - - - 126/51 100 %   03/06/16 0800 - - - 138/46 -   03/06/16 0730 - - - 131/44 -   03/05/16 1714 97.8 ??F (36.6 ??C) 86 16 142/55 94 %       No intake or output data in the 24 hours ending 03/06/16 0906    Physical Exam   Constitutional: No distress.   HENT:   Head: Normocephalic and atraumatic.   Neck: Neck supple. No tracheal deviation present. No thyromegaly present.   Cardiovascular: Normal rate and regular rhythm.  Exam reveals decreased pulses.    Murmur heard.  Bilateral carotid bruits   Pulmonary/Chest: Effort normal and breath sounds normal.   Abdominal: Soft. Bowel sounds are normal. She exhibits no distension. There is no tenderness.   Musculoskeletal: She exhibits no edema or tenderness.   Lymphadenopathy:     She has no cervical adenopathy.   Neurological: She is alert.   Responsive, able to answer questions, oriented only to person   Skin: Skin is warm and dry. She is not diaphoretic.   Hypopigmented/depigmented macules on bilateral LEs   Psychiatric: She has a normal mood and affect. Her behavior is normal.       Lab/Data Reviewed:   Recent Results (from the past 24 hour(s))   CBC WITH AUTOMATED DIFF    Collection Time: 03/05/16  2:52 PM   Result Value Ref Range    WBC 6.4 4.6 - 13.2 K/uL    RBC 4.53 4.20 - 5.30 M/uL    HGB 10.8 (L) 12.0 - 16.0 g/dL    HCT 34.1 (L) 35.0 - 45.0 %    MCV 75.3 74.0 - 97.0 FL    MCH 23.8 (L) 24.0 - 34.0 PG    MCHC 31.7 31.0 - 37.0 g/dL    RDW 14.3 11.6 - 14.5 %    PLATELET 221 135 - 420 K/uL    MPV 10.0 9.2 - 11.8 FL    NEUTROPHILS 80 (H) 40 - 73 %    LYMPHOCYTES 11 (L) 21 - 52 %    MONOCYTES 9 3 - 10 %    EOSINOPHILS 0 0 - 5 %    BASOPHILS 0 0 - 2 %    ABS. NEUTROPHILS 5.2 1.8 - 8.0 K/UL    ABS. LYMPHOCYTES 0.7 (L) 0.9 - 3.6 K/UL    ABS. MONOCYTES 0.6 0.05 - 1.2 K/UL  ABS. EOSINOPHILS 0.0 0.0 - 0.4 K/UL    ABS. BASOPHILS 0.0 0.0 - 0.06 K/UL    DF AUTOMATED     METABOLIC PANEL, BASIC    Collection Time: 03/05/16  2:52 PM   Result Value Ref Range    Sodium 138 136 - 145 mmol/L    Potassium 4.3 3.5 - 5.5 mmol/L    Chloride 103 100 - 108 mmol/L    CO2 23 21 - 32 mmol/L    Anion gap 12 3.0 - 18 mmol/L    Glucose 188 (H) 74 - 99 mg/dL    BUN 65 (H) 7.0 - 18 MG/DL    Creatinine 1.49 (H) 0.6 - 1.3 MG/DL    BUN/Creatinine ratio 44 (H) 12 - 20      GFR est AA 40 (L) >60 ml/min/1.40m    GFR est non-AA 33 (L) >60 ml/min/1.744m   Calcium 9.6 8.5 - 10.1 MG/DL   CARDIAC PANEL,(CK, CKMB & TROPONIN)    Collection Time: 03/05/16  2:52 PM   Result Value Ref Range    CK 39 26 - 192 U/L    CK - MB 2.4 <3.6 ng/ml    CK-MB Index 6.2 (H) 0.0 - 4.0 %    Troponin-I, Qt. 0.03 0.0 - 0.045 NG/ML   LIPASE    Collection Time: 03/05/16  2:52 PM   Result Value Ref Range    Lipase 163 73 - 393 U/L   MAGNESIUM    Collection Time: 03/05/16  2:52 PM   Result Value Ref Range    Magnesium 1.9 1.6 - 2.6 mg/dL   HEPATIC FUNCTION PANEL    Collection Time: 03/05/16  2:52 PM   Result Value Ref Range    Protein, total 7.3 6.4 - 8.2 g/dL    Albumin 3.5 3.4 - 5.0 g/dL    Globulin 3.8 2.0 - 4.0 g/dL    A-G Ratio 0.9 0.8 - 1.7       Bilirubin, total 0.5 0.2 - 1.0 MG/DL    Bilirubin, direct 0.2 0.0 - 0.2 MG/DL    Alk. phosphatase 85 45 - 117 U/L    AST (SGOT) 15 15 - 37 U/L    ALT (SGPT) 12 (L) 13 - 56 U/L   POC LACTIC ACID    Collection Time: 03/05/16  3:06 PM   Result Value Ref Range    Lactic Acid (POC) 1.2 0.4 - 2.0 mmol/L   EKG, 12 LEAD, INITIAL    Collection Time: 03/05/16  4:06 PM   Result Value Ref Range    Ventricular Rate 88 BPM    Atrial Rate 88 BPM    P-R Interval 114 ms    QRS Duration 72 ms    Q-T Interval 358 ms    QTC Calculation (Bezet) 433 ms    Calculated P Axis 75 degrees    Calculated R Axis 60 degrees    Calculated T Axis -74 degrees    Diagnosis       Normal sinus rhythm  Minimal voltage criteria for LVH, may be normal variant  Anterior infarct , age undetermined  T wave abnormality, consider inferolateral ischemia  Abnormal ECG  When compared with ECG of 22-Nov-2015 07:33,  Inverted T waves have replaced nonspecific T wave abnormality in Inferior   leads  T wave inversion more evident in Anterior leads  Confirmed by PaElita [id number]) on 03/05/2016 5:29:46 PM     URINALYSIS W/ RFLX MICROSCOPIC    Collection Time:  03/05/16  6:20 PM   Result Value Ref Range    Color YELLOW      Appearance CLEAR      Specific gravity 1.026 1.005 - 1.030      pH (UA) 5.0 5.0 - 8.0      Protein NEGATIVE  NEG mg/dL    Glucose NEGATIVE  NEG mg/dL    Ketone 15 (A) NEG mg/dL    Bilirubin NEGATIVE  NEG      Blood NEGATIVE  NEG      Urobilinogen 0.2 0.2 - 1.0 EU/dL    Nitrites NEGATIVE  NEG      Leukocyte Esterase TRACE (A) NEG     URINE MICROSCOPIC ONLY    Collection Time: 03/05/16  6:20 PM   Result Value Ref Range    WBC 0 to 1 0 - 5 /hpf    RBC NEGATIVE  0 - 5 /hpf    Epithelial cells FEW 0 - 5 /lpf    Bacteria NEGATIVE  NEG /hpf    Hyaline cast 0 to 3 0 - 2 /lpf       Scheduled Medications Reviewed:  Current Facility-Administered Medications   Medication Dose Route Frequency   ??? docusate sodium (COLACE) capsule 100 mg  100 mg Oral DAILY    ??? ferrous sulfate tablet 325 mg  1 Tab Oral TID WITH MEALS   ??? famotidine (PEPCID) tablet 20 mg  20 mg Oral QHS   ??? 0.9% sodium chloride infusion  75 mL/hr IntraVENous CONTINUOUS         Imaging, microbiology, and EKG/Telemetry:      Assessment/Plan     82 y.o. female with PMH HTN, GERD, anemia hx of UTI now admitted for Acute kidney Injury   ??  Acute kidney injury: Cr. 1.49>0.96. Baseline 0.7. Patient given 2 L bolus in the ED  - IVF: NS 75cc/hr  - urine cx/blood cx pending  - daily labs bmp  - fall precautions  ??  Poor PO intake- chronic, TSH 0.54  - ensure/glucerna with all meals  - case management  - pt/ot evalaute for possible placement  - nutrition consulted- appreciate recs  - encourage PO  - consult hospice/palliave in the am after discussing with daughter  - CXR pending  - PT/OT eval  ??  Hx of iron def anemia: labs in 2016 showed TIBC 232, fe- 25, iron sat 11%  - daily cbc  - will continue home iron supplementation  ??  HTN:??well controlled  - will hold diuretic htn meds  - monitor vital signs  ??  GERD  - continue home pepcid 25m daily  ????  DM type 2:??last A1c- 6.6 7/17 diet  Controlled not on meds outpatient  - daily bmp  - if glucose elevated will initiate SSI  ??  ????  ??  Diet: regular  DVT Prophylaxis: lovenox  Code Status: Full  Point of Contact: Daughter    JHeide Spark MD, PGY-1  [facility]  03/06/16 9:06 AM

## 2016-03-06 NOTE — Progress Notes (Signed)
SW received a note from [place] with [facility].  Medi received the referral from Dr. Brion Aliment, the patient's PCP.  They met with the patient in August and she was not ready for hospice at that time, but is open to conversation now.  Lattie Haw plans to come visit with the patient tomorrow between 10-11am.  SW will assist as needed with treatment plan.

## 2016-03-06 NOTE — Other (Signed)
GLYCEMIC CONTROL SCREENING INITIATED:    No results found for: HBA1C, HGBE8, HBA1CEXT   Lab Results   Component Value Date/Time    Glucose 170 03/06/2016 11:55 AM           Glucose values exceeding inpatient target range: ICU 140-180mg /dl            non-ICU [id number]$[id number] /dl          Recommend corrective insulin per IP Insulin order set.        Standard insulin scale    Very insuln resistant scale     Garner GavelShannon Cairns, RD, CDE

## 2016-03-06 NOTE — Progress Notes (Addendum)
NUTRITION    Nutrition Consult: General Nutrition Management & Supplements     RECOMMENDATIONS / PLAN:     - Monitor and encourage po intake.  - Continue diet order and supplements, pending plan of care.  - Continue IVF hydration until adequate po intake.  - Recalculate nutritional needs once ht and weight are updated by nursing  - Continue RD inpatient monitoring and evaluation.     NUTRITION INTERVENTIONS & DIAGNOSIS:      Meals/Snacks: modified diet   Medical food supplementation: Glucerna TID   IV Fluids: NS at 75 mL/hr    Nutrition Diagnosis: Inadequate oral intake related to decreased appetite as evidenced by poor/minimal po intake PTA, per MD note daughter reports only consuming water x 3 weeks PTA.    ASSESSMENT:     Pt very lethargic and difficult to understand during visit. Pt reports not feeling hungry and per MD note daughter states only sips of water x 3 weeks PTA. No ht or weight documented in chart at this time, used information from prior admission to calculate nutrition needs. Plan for hospice/pallative consult, per MD note. Renal function improving.    Average po intake adequate to meet patients estimated nutritional needs:    Yes      No    Unable to determine at this time    Diet: DIET MECHANICAL SOFT  DIET NUTRITIONAL SUPPLEMENTS All Meals; [place] Allergies: NKFA  Current Appetite:    Good      Fair      Poor      Other:  Appetite/meal intake prior to admission:    Good      Fair      Poor, per MD note/information from daughter     Other:  Feeding Limitations:   Swallowing difficulty     Chewing difficulty: Edentulous     Other:  Current Meal Intake: No data found.      BM:  PTA  Skin Integrity: Unknown  Edema: Unknown  Pertinent Medications: Reviewed    Recent Labs      03/06/16   1155  03/05/16   1452   NA  143  138   K  3.9  4.3   CL  109*  103   CO2  24  23   GLU  170*  188*   BUN  33*  65*   CREA  0.96  1.49*   CA  9.4  9.6   MG   --   1.9   ALB  3.5  3.5    SGOT   --   15   ALT   --   12*     No intake or output data in the 24 hours ending 03/06/16 1536     Last HBA1C: 6.6% (11/2015)    Anthropometrics:  Ht Readings from Last 1 Encounters:   12/06/15 5' (1.524 m)     There were no vitals filed for this visit.  There is no height or weight on file to calculate BMI.          Weight History:     Weight Metrics 12/06/2015 11/22/2015 11/21/2015   Weight 84 lb 1.6 oz 88 lb -   BMI 16.42 kg/m2 - 17.19 kg/m2        Admitting Diagnosis: Acute kidney injury ([place])  Acute kidney injury ([place])  Pertinent PMHx: HTN, GERD, DM2    Education Needs:  None identified   Identified - Not appropriate at this time    Identified and addressed - refer to education log  Learning Limitations:    None identified   Identified    Cultural, religious & ethnic food preferences:   None identified     Identified and addressed     ESTIMATED NUTRITION NEEDS:     Calories: 1150-1610 kcal (25-35 kcal/kg) based on   Actual BW       IBW: 46 kg   Protein: 37-55 gm (0.8-1.2 gm/kg) based on   Actual BW       IBW   Fluid: 1 mL/kcal     MONITORING & EVALUATION:     Nutrition Goal(s):   1. Po intake of meals will meet >75% of patient estimated nutritional needs within the next 7 days.  Outcome:   Met/Ongoing      Not Met     New/Initial Goal     Monitoring:    Diet tolerance    Meal intake    Supplement intake    GI symptoms/ability to tolerate po diet    Respiratory status    Plan of care      Previous Recommendations (for follow-up assessments only):        Implemented          Not Implemented (RD to address)       No Longer Appropriate      No Recommendation Made     Discharge Planning:  Regular, consistency pending ability to tolerate   Participated in care planning, discharge planning, & interdisciplinary rounds as appropriate      Patton Salles, RD   Pager: [phone]

## 2016-03-06 NOTE — Progress Notes (Signed)
Problem: Mobility Impaired (Adult and Pediatric)  Goal: *Acute Goals and Plan of Care (Insert Text)  Physical Therapy Goals  Initiated 03/06/2016 and to be accomplished within 7 day(s)  1.  Patient will move from supine to sit and sit to supine  in bed with modified independence.     2.  Patient will transfer from bed to chair and chair to bed with modified independence using the least restrictive device.  3.  Patient will perform sit to stand with modified independence.  4.  Patient will ambulate with modified independence for 150 feet with the least restrictive device.   5.  Patient will ascend/descend 3 stairs with 1 handrail(s) with minimal assistance/contact guard assist.  physical Therapy EVALUATION    [name] ([age] y.o. female)  Date: 03/06/2016  Primary Diagnosis: Acute kidney injury (HCC)  Acute kidney injury (HCC)        Precautions: fall       ASSESSMENT :  Based on the objective data described below, the patient presents with mildly decreased strength, balance and activity tolerance resulting in decreased independence with functional mobility.  Pt performed bed mobility with supervision and sit to stand transfer with SBA.  Pt ambulated 100 feet with RW and supervision.      Patient will benefit from skilled intervention to address the above impairments.  Patient???s rehabilitation potential is considered to be Good  Factors which may influence rehabilitation potential include:            None noted           Mental ability/status           Medical condition           Home/family situation and support systems           Safety awareness           Pain tolerance/management           Other:      PLAN :  Recommendations and Planned Interventions:             Bed Mobility Training                 Neuromuscular Re-Education             Transfer Training                       Orthotic/Prosthetic Training             Gait Training                              Modalities              Therapeutic Exercises              Edema Management/Control             Therapeutic Activities                Patient and Family Training/Education             Other (comment):    Frequency/Duration: Patient will be followed by physical therapy 1-2 times per day/4-7 days per week to address goals.  Discharge Recommendations: Home Health  Further Equipment Recommendations for Discharge: rolling walker     G-CODES      Mobility [phone] Current  CI= 1-19%  [id number] Goal  CI= 1-19%.  The severity rating is  based on the Level of Assistance required for Functional Mobility and ADLs.      SUBJECTIVE:   Patient stated ???I feel good.???    OBJECTIVE DATA SUMMARY:     Past Medical History:   Diagnosis Date   ??? Arthritis    ??? Diabetes (HCC)    ??? Hypertension      Past Surgical History:   Procedure Laterality Date   ??? HX GYN      hysterectomy     Prior Level of Function/Home Situation: pt lives with her daughter, pt stated that she used to have a walker but doesn't know where it is      Critical Behavior:   calm and cooperative      Strength:    Strength: Generally decreased, functional (B LEs)   Tone & Sensation:   Tone: Normal (B LEs)       Range Of Motion:  AROM: Within functional limits (B LEs)      Functional Mobility:  Bed Mobility:  Supine to Sit: Modified independent  Sit to Supine: Supervision     Transfers:  Sit to Stand: Stand-by asssistance  Stand to Sit: Stand-by asssistance  Balance:   Sitting: Intact  Standing: With support  Standing - Static: Good  Standing - Dynamic : Fair  Ambulation/Gait Training:  Distance (ft): 100 Feet (ft)  Assistive Device: Walker, rolling  Ambulation - Level of Assistance: Supervision  Gait Abnormalities: Decreased step clearance  Speed/Cadence: Slow  Pain:  Pt reports 0/10 pain or discomfort prior to treatment. ??  Pt reports 0/10 pain or discomfort post treatment.     Activity Tolerance:   fair  Please refer to the flowsheet for vital signs taken during this treatment.   After treatment:            Patient left in no apparent distress sitting up in chair           Patient left in no apparent distress in bed           Call bell left within reach           Nursing present in room            Caregiver present           Bed alarm activated    COMMUNICATION/EDUCATION:            Fall prevention education was provided and the patient/caregiver indicated understanding.           Patient/family have participated as able in goal setting and plan of care.           Patient/family agree to work toward stated goals and plan of care.           Patient understands intent and goals of therapy, but is neutral about his/her participation.           Patient is unable to participate in goal setting and plan of care.  Educated patient on calling for assistance to get up    Thank you for this referral.  Jannifer HickLori A Surber, PT   Time Calculation: 20 mins

## 2016-03-06 NOTE — Progress Notes (Signed)
Chaplain conducted an initial consultation and Spiritual Assessment for [name] Juarez, who is a 82 y.o.,female. Patient???s Primary Language is: [place].   According to the patient???s EMR Religious Affiliation is: [place].     The reason the Patient came to the hospital is:   Patient Active Problem List    Diagnosis Date Noted   ??? Acute kidney injury (HCC) 03/05/2016   ??? Altered mental status 12/05/2015   ??? Hypomagnesemia 11/21/2015   ??? Chest pain 11/21/2015   ??? Weakness 11/21/2015   ??? Sepsis (HCC) 11/21/2015        The Chaplain provided the following Interventions:  Initiated a relationship of care and support.   Explored issues of faith, belief, spirituality and religious/ritual needs while hospitalized.  Listened empathically to patient about her reason(s) for being at ER, her life and religious beliefs.   Provided information about Spiritual Care Services.  Offered prayer and assurance of continued prayers on patient's behalf.     The following outcomes where achieved:  Patient shared limited information about both their medical narrative and spiritual journey/beliefs.  Chaplain confirmed Patient's Religious Affiliation:  [facility].   Patient processed feeling about current hospitalization.  Patient expressed gratitude for chaplain's visit.    Assessment:  Patient does not have any religious/cultural needs that will affect patient???s preferences in health care.  There are no spiritual or religious issues which require intervention at this time.     Plan:  Chaplains will continue to follow and will provide pastoral care on an as needed/requested basis.  Chaplain recommends bedside caregivers page chaplain on duty if patient shows signs of acute spiritual or emotional distress.      Maurine MinisterCharles Keiaira Donlan, [facility]  Chaplain  Spiritual Care  [phone]

## 2016-03-06 NOTE — ED Notes (Signed)
Placed in hospital bed made comfortable changed of wet brief skin intact. Patient able to stand and transfer with assistance of one.

## 2016-03-06 NOTE — ED Notes (Signed)
TRANSFER - OUT REPORT:    Verbal report given to Foye ClockKristina, RN(name) on Reather LittlerKatherine Eskelson  being transferred to [facility] (unit) for routine progression of care       Report consisted of patient???s Situation, Background, Assessment and   Recommendations(SBAR).     Information from the following report(s) SBAR, ED Summary, [facility] and Recent Results was reviewed with the receiving nurse.    Lines:   Peripheral IV 12/06/15 Right Forearm (Active)       Peripheral IV 03/05/16 Right Antecubital (Active)   Site Assessment Clean, dry, & intact 03/05/2016  2:52 PM   Phlebitis Assessment 0 03/05/2016  2:52 PM   Infiltration Assessment 0 03/05/2016  2:52 PM   Dressing Status Clean, dry, & intact 03/05/2016  2:52 PM   Dressing Type Tape;Transparent 03/05/2016  2:52 PM   Hub Color/Line Status Pink;Flushed;Patent 03/05/2016  2:52 PM        Opportunity for questions and clarification was provided.

## 2016-03-06 NOTE — ED Notes (Signed)
Bedside shift change report given to Kameko, RN (oncoming nurse) by Betty Ann, RN (offgoing nurse). Report included the following information SBAR, ED Summary, MAR and Recent Results.

## 2016-03-07 LAB — CBC W/O DIFF
HCT: 25.8 % — ABNORMAL LOW (ref 35.0–45.0)
HGB: 8.2 g/dL — ABNORMAL LOW (ref 12.0–16.0)
MCH: 23.8 PG — ABNORMAL LOW (ref 24.0–34.0)
MCHC: 31.8 g/dL (ref 31.0–37.0)
MCV: 75 FL (ref 74.0–97.0)
MPV: 10.1 FL (ref 9.2–11.8)
PLATELET: 157 10*3/uL (ref 135–420)
RBC: 3.44 M/uL — ABNORMAL LOW (ref 4.20–5.30)
RDW: 14.3 % (ref 11.6–14.5)
WBC: 6.3 10*3/uL (ref 4.6–13.2)

## 2016-03-07 LAB — METABOLIC PANEL, BASIC
Anion gap: 8 mmol/L (ref 3.0–18)
BUN/Creatinine ratio: 37 — ABNORMAL HIGH (ref 12–20)
BUN: 25 MG/DL — ABNORMAL HIGH (ref 7.0–18)
CO2: 24 mmol/L (ref 21–32)
Calcium: 8.5 MG/DL (ref 8.5–10.1)
Chloride: 113 mmol/L — ABNORMAL HIGH (ref 100–108)
Creatinine: 0.67 MG/DL (ref 0.6–1.3)
GFR est AA: >60 mL/min/{1.73_m2} (ref >60)
GFR est non-AA: >60 mL/min/{1.73_m2} (ref >60)
Glucose: 82 mg/dL (ref 74–99)
Potassium: 3.3 mmol/L — ABNORMAL LOW (ref 3.5–5.5)
Sodium: 145 mmol/L (ref 136–145)

## 2016-03-07 LAB — HGB & HCT
HCT: 27.9 % — ABNORMAL LOW (ref 35.0–45.0)
HCT: 28.8 % — ABNORMAL LOW (ref 35.0–45.0)
HGB: 8.8 g/dL — ABNORMAL LOW (ref 12.0–16.0)
HGB: 9.1 g/dL — ABNORMAL LOW (ref 12.0–16.0)

## 2016-03-07 MED ORDER — DEXTROSE 5%-1/2 NORMAL SALINE IV
INTRAVENOUS | Status: DC
Start: 2016-03-07 — End: 2016-03-08
  Administered 2016-03-07 – 2016-03-08 (×2): via INTRAVENOUS

## 2016-03-07 MED ORDER — POTASSIUM CHLORIDE 20 MEQ ORAL PACKET FOR SOLUTION
20 mEq | ORAL | Status: AC
Start: 2016-03-07 — End: 2016-03-07
  Administered 2016-03-07: 13:00:00 via ORAL

## 2016-03-07 MED FILL — DEXTROSE 5%-1/2 NORMAL SALINE IV: INTRAVENOUS | Qty: 1000

## 2016-03-07 MED FILL — DOCUSATE SODIUM 100 MG CAP: 100 mg | ORAL | Qty: 1

## 2016-03-07 MED FILL — POTASSIUM CHLORIDE 20 MEQ ORAL PACKET FOR SOLUTION: 20 mEq | ORAL | Qty: 2

## 2016-03-07 MED FILL — FERROUS SULFATE 325 MG (65 MG ELEMENTAL IRON) TAB: 325 mg (65 mg iron) | ORAL | Qty: 1

## 2016-03-07 MED FILL — FAMOTIDINE 20 MG TAB: 20 mg | ORAL | Qty: 1

## 2016-03-07 NOTE — Discharge Summary (Signed)
Discharge Summary  [facility]      [name] Age: 82 y.o.  Sex: female  DOB: [birth year]    MRN: [id number]      DOA: 03/05/2016      Discharge Date: 03/09/2016      Attending:Stanley Georgeanna LeaBrittman, MD      PCP: [name] ProudStephen Brawley, MD        ================================================================    Reason for Admission:   Acute kidney injury [facility](HCC)    Discharge Diagnoses:   AKI (resolved)  HTN (controlled on medications)  GERD (stable)  Anemia (chronic, iron deficient, poor PO intake)  Recurrent UTI (resolved)  Hypokalemia (resolved)    Important notes to PCP/ follow-up studies and evaluations   -Pt's daughter is POA and has signed a DNR at the hospital.  -Pt has poor PO intake, she has passed swallow eval and has no signs or symptoms of obstruction. She will eat if fed but often does not like to feed herself. She has 4/5 strength in both upper extremities.    Pending labs and studies:  None  Operative Procedures:   None    Discharge Medications:     Current Discharge Medication List      CONTINUE these medications which have NOT CHANGED    Details   amLODIPine (NORVASC) 10 mg tablet Take 10 mg by mouth daily. Indications: hypertension      hydrALAZINE (APRESOLINE) 10 mg tablet Take 10 mg by mouth two (2) times a day.      docusate sodium (COLACE) 100 mg capsule Take 100 mg by mouth daily. Indications: constipation      raNITIdine (ZANTAC) 300 mg tablet Take 300 mg by mouth nightly.      ferrous sulfate (IRON) 325 mg (65 mg iron) EC tablet Take 325 mg by mouth three (3) times daily (with meals).      traMADol (ULTRAM) 50 mg tablet Take 50 mg by mouth every six (6) hours as needed for Pain.                Disposition: Hospice    Consultants:    None    Brief Hospital Course (including pertinent history and physical findings)  Pt was sent to ED on 10/31 by PCP d/t dehydration. Pt's daughter wanted her to be admitted d/t difficulty caring for her and pt's poor PO intake.  Pt was noted to have AKI in ED. Pt had also had stable weight over last 3 months but had lost 18lb the 3 months before.     AKI 2/2 dehydration: Cr: 1.49>0.96>0.67>0.57. Pt resuscitated with NS @ 3575mL/hr, which was then switched to D5 1/2 NS. Pt remained asymptomatic.    UTI: UA unconcerning when pt was admitted, however ucx was done and showed UTI. Pt was being given fluids. Pt remained asymptomatic so abx not given.    Anemia: Pt was given iron supplementation during admission as part of her regular regimen but H&H kept decreasing. Repeat H&Hs were reassuring    Hypokalemia: K repleted and was added to IVF. Repeat K was wnl.    Summarized key findings and results (labs, imaging studies, ECHO, cardiac cath, endoscopies, etc):  Blood cx: NG2D  Urine cx: 20K colonies/mL Gm- rods  Hgb: 10.8>11.0>8.2>8.8>9.1>7.4  K: 4.3>3.9>3.3>2.9  Cr: 1.49>0.96>0.67>0.57    Functional status and cognitive function:    Ambulates slowly and is unsteady. Rolling walker ordered.   Status: alert, cooperative, no distress, appears stated age    Diet: Regular soft  Code status and advanced care plan: DNR  [facility] Point of Contact:Daughter               [name] Juarez           [phone]      Follow-up:   Follow-up Information     Follow up With Details Comments Contact Info    [name] ProudStephen Brawley, MD  As needed [address]  [address]  [place] [id number]  [phone]              ================================================================  [name] OsgoodJoanne Kylin Dubs, MD, PGY-1  [facility]  [facility]  03/09/16 7:27 PM

## 2016-03-07 NOTE — Progress Notes (Signed)
Intern Progress Note  [facility]       [name] MRN: [id number]  CSN: [id number]    Date of Birth: 1922/04/29  Age: 82 y.o.  Sex: female    DOA: 03/05/2016 LOS:  LOS: 2 days                    Subjective:     Acute events: Pt resting in bed, no complaints. Daughter at bedside, was counseled about hospice care and signed DNR paperwork. Hospice nurses came to room today and will FU with family.    Review of Systems   Constitutional: Negative for chills and fever.   Respiratory: Negative for shortness of breath.    Cardiovascular: Negative for chest pain and palpitations.   Gastrointestinal: Negative for abdominal pain, nausea and vomiting.   Genitourinary: Negative for dysuria and hematuria.   Neurological: Negative for dizziness and headaches.       Objective:      Patient Vitals for the past 24 hrs:   Temp Pulse Resp BP SpO2   03/07/16 1159 97.7 ??F (36.5 ??C) 82 16 136/63 100 %   03/07/16 0700 98 ??F (36.7 ??C) 86 16 134/52 100 %   03/07/16 0553 98.5 ??F (36.9 ??C) 78 16 133/88 99 %   03/07/16 0017 98.1 ??F (36.7 ??C) 88 16 123/68 99 %   03/06/16 2051 98.5 ??F (36.9 ??C) 68 16 (!) 152/94 99 %   03/06/16 1605 97.1 ??F (36.2 ??C) 90 16 135/66 97 %         Intake/Output Summary (Last 24 hours) at 03/07/16 1213  Last data filed at 03/07/16 0659   Gross per 24 hour   Intake              900 ml   Output              300 ml   Net              600 ml       Physical Exam   Constitutional: No distress.   HENT:   Head: Normocephalic and atraumatic.   Eyes: EOM are normal.   Neck: Normal range of motion. Neck supple. No thyromegaly present.   Cardiovascular: Normal rate.  Exam reveals decreased pulses.    Murmur heard.  Pulmonary/Chest: Effort normal and breath sounds normal.   Abdominal: Soft. She exhibits no distension. Bowel sounds are decreased. There is no tenderness.   Musculoskeletal: She exhibits no edema or tenderness.   Lymphadenopathy:     She has no cervical adenopathy.    Neurological: She is alert.   Oriented only to person, cooperative, responsive   Skin: Skin is warm and dry. She is not diaphoretic.   Multiple hypopigmented macules on all extremities   Psychiatric: She has a normal mood and affect. Her behavior is normal.       Lab/Data Reviewed:  Recent Results (from the past 24 hour(s))   CULTURE, BLOOD    Collection Time: 03/06/16  4:00 PM   Result Value Ref Range    Special Requests: NO SPECIAL REQUESTS      Culture result: NO GROWTH AFTER 15 HOURS     CULTURE, BLOOD    Collection Time: 03/06/16  4:05 PM   Result Value Ref Range    Special Requests: NO SPECIAL REQUESTS      Culture result: NO GROWTH AFTER 15 HOURS     CULTURE, URINE  Collection Time: 03/06/16 10:36 PM   Result Value Ref Range    Special Requests: NO SPECIAL REQUESTS      Culture result: CULTURE IN PROGRESS,FURTHER UPDATES TO FOLLOW     METABOLIC PANEL, BASIC    Collection Time: 03/07/16  1:41 AM   Result Value Ref Range    Sodium 145 136 - 145 mmol/L    Potassium 3.3 (L) 3.5 - 5.5 mmol/L    Chloride 113 (H) 100 - 108 mmol/L    CO2 24 21 - 32 mmol/L    Anion gap 8 3.0 - 18 mmol/L    Glucose 82 74 - 99 mg/dL    BUN 25 (H) 7.0 - 18 MG/DL    Creatinine 2.950.67 0.6 - 1.3 MG/DL    BUN/Creatinine ratio 37 (H) 12 - 20      GFR est AA >60 >60 ml/min/1.4273m2    GFR est non-AA >60 >60 ml/min/1.1473m2    Calcium 8.5 8.5 - 10.1 MG/DL   CBC W/O DIFF    Collection Time: 03/07/16  1:41 AM   Result Value Ref Range    WBC 6.3 4.6 - 13.2 K/uL    RBC 3.44 (L) 4.20 - 5.30 M/uL    HGB 8.2 (L) 12.0 - 16.0 g/dL    HCT 62.125.8 (L) 30.835.0 - 45.0 %    MCV 75.0 74.0 - 97.0 FL    MCH 23.8 (L) 24.0 - 34.0 PG    MCHC 31.8 31.0 - 37.0 g/dL    RDW 65.714.3 84.611.6 - 96.214.5 %    PLATELET 157 135 - 420 K/uL    MPV 10.1 9.2 - 11.8 FL   HGB & HCT    Collection Time: 03/07/16  7:30 AM   Result Value Ref Range    HGB 8.8 (L) 12.0 - 16.0 g/dL    HCT 95.227.9 (L) 84.135.0 - 45.0 %       Scheduled Medications Reviewed:  Current Facility-Administered Medications    Medication Dose Route Frequency   ??? dextrose 5 % - 0.45% NaCl infusion  75 mL/hr IntraVENous CONTINUOUS   ??? influenza vaccine 2017-18 (3 yrs+)(PF) (FLUZONE QUAD/FLUARIX QUAD) injection 0.5 mL  0.5 mL IntraMUSCular PRIOR TO DISCHARGE   ??? docusate sodium (COLACE) capsule 100 mg  100 mg Oral DAILY   ??? ferrous sulfate tablet 325 mg  1 Tab Oral TID WITH MEALS   ??? famotidine (PEPCID) tablet 20 mg  20 mg Oral QHS         Imaging, microbiology, and EKG/Telemetry:  CXR: No acute cardiopulmonary process.   BCX: NG15H    Assessment/Plan     82 y.o.??female??with PMH HTN, GERD, anemia hx of UTI now admitted for Acute kidney Injury   ????  Acute kidney injury: Cr. 1.49>0.96>0.67. Baseline 0.7. Patient given 2 L bolus in the ED  - IVF: D5 1/2NS 75cc/hr  - urine cx/blood cx pending  - fall precautions  ????  Poor PO intake- chronic, TSH 0.54  - ensure/glucerna??with all meals  - case management- FU with hospice  - pt/ot- home health w/ rolling walker  - nutrition consulted- appreciate recs  - encourage PO  ????  Hx of iron def anemia: labs in 2016 showed TIBC 232, fe- 25, iron sat 11%  - will continue home iron supplementation  ????  HTN:??well controlled  - will hold diuretic htn meds  - monitor vital signs  ????  GERD  - continue home pepcid 20mg  daily  ????  DM type 2:??last  A1c- 6.6 7/17 diet ??Controlled not on meds outpatient  - daily bmp  - if glucose elevated will initiate SSI  ????  ????  ????  Diet: regular  DVT Prophylaxis: lovenox  Code Status: Full  Point of Contact: Daughter [name] Juarez [phone]    Germain Osgood, MD, PGY-1  [facility]  03/07/16 12:13 PM

## 2016-03-07 NOTE — Hospice (Addendum)
[facility] but it appears the patient has been in contact with another hospice agency and is scheduled to meet with them on today.

## 2016-03-07 NOTE — Progress Notes (Signed)
Problem: Mobility Impaired (Adult and Pediatric)  Goal: *Acute Goals and Plan of Care (Insert Text)  Physical Therapy Goals  Initiated 03/06/2016 and to be accomplished within 7 day(s)  1.  Patient will move from supine to sit and sit to supine  in bed with modified independence.     2.  Patient will transfer from bed to chair and chair to bed with modified independence using the least restrictive device.  3.  Patient will perform sit to stand with modified independence.  4.  Patient will ambulate with modified independence for 150 feet with the least restrictive device.   5.  Patient will ascend/descend 3 stairs with 1 handrail(s) with minimal assistance/contact guard assist.   physical Therapy TREATMENT    [name] (96(82 y.o. female)  Date: 03/07/2016  Diagnosis: Acute kidney injury (HCC)  Acute kidney injury (HCC) Acute kidney injury (HCC)       Precautions: Fall,skin   Chart, physical therapy assessment, plan of care and goals were reviewed.    ASSESSMENT:  Pt demo's significantly decreased activity tolerance this encounter as demo's by the inability to progress beyond sitting at EOB.  Pt is receptive and polite throughout,however, unable to participate with full session.  Pt has been considering home with hospice upon discharge.  Will continue to follow for developments.  Progression toward goals:        Improving appropriately and progressing toward goals        Improving slowly and progressing toward goals        Not making progress toward goals and plan of care will be adjusted     PLAN:   Patient continues to benefit from skilled intervention to address the above impairments.  Continue treatment per established plan of care.  Discharge Recommendations:  Home with hospice  Further Equipment Recommendations for Discharge:  N/A     G-CODES:     Mobility  [id number] Goal  CI= 1-19%.  The severity rating is based on the Level of Assistance required for Functional Mobility and ADLs.   Mobility [id number] Current  CI= 1-19%.  The severity rating is based on the Level of Assistance required for Functional Mobility and ADLs.      SUBJECTIVE:   Patient stated ???That's what I said.  I don't feel too good.???  Pt is difficult to understand at times and appears quite weak.  Pt declines any standing/walking once seated EOB for several min's.    OBJECTIVE DATA SUMMARY:   Critical Behavior:  Neurologic State: Alert  Orientation Level: Oriented to person, Oriented to place  Functional Mobility Training:  Bed Mobility:  Rolling: Supervision  Supine to Sit: Supervision  Sit to Supine: Supervision  Scooting: Supervision (verbal cues for increased bilateral LE use)  Transfers:  Sit to Stand:  (pt declines )  Balance:  Sitting: Intact  Ambulation/Gait Training:  Gait Description (WDL):  (unable to progress beyond sitting EOB)  Pain:  Pt reports 0/10 pain or discomfort prior to treatment. ??  Pt reports 0/10 pain or discomfort post treatment.   Activity Tolerance:   Poor: pt is able to motivate to EOB,however, once seated EOB cannot continue 2/2 c/o "not feeling good."  Please refer to the flowsheet for vital signs taken during this treatment.  After treatment:    Patient left in no apparent distress sitting up in chair   Patient left in no apparent distress in bed   Call bell left within reach   Nursing notified  Caregiver present   Bed alarm activated      Governor RooksIan Kincade, PTA   Time Calculation: 11 mins

## 2016-03-07 NOTE — Other (Signed)
1926 Assumed care of patient from off going nurse. Patient resting in bed. No distress noted. Call bell within reach, siderails up x 3, bed in lowest position, and patient instructed to use call bell for assistance. Will continue to monitor.    0745 Bedside and Verbal shift change report given to Eve RN (oncoming nurse) by Ann HeldSerenia S McCullor, RN  (offgoing nurse). Report included the following information Kardex, Intake/Output, MAR and Recent Results.

## 2016-03-07 NOTE — Progress Notes (Addendum)
NUTRITION    Nursing Referral: [facility]  Nutrition Consult: General Nutrition Management & Supplements     RECOMMENDATIONS / PLAN:     - Monitor and encourage po intake.  - Continue diet order and supplements, specify flavor preference for nutrition supplement  - Continue IVF hydration until adequate po intake.  - Continue RD inpatient monitoring and evaluation.     NUTRITION INTERVENTIONS & DIAGNOSIS:      Meals/Snacks: modified diet   Medical food supplementation: Glucerna TID   IV Fluids: D5-NS at 75 mL/hr (90 gm dextrose, 306 kcal)    Nutrition Diagnosis: Inadequate oral intake related to decreased appetite as evidenced by poor/minimal po intake PTA, per MD note daughter reports only consuming water x 3 weeks PTA.    Patient meets criteria for Severe Protein Calorie Malnutrition as evidenced by:   ASPEN Malnutrition Criteria  Acute Illness, Chronic Illness, or Social/Enviornmental: Chronic illness  Weight Loss: Greater than 10% x 6 mos  Muscle Mass: Severe (depression in temples and hands; thin calves)  ASPEN Malnutrition Score - Chronic Illness: 12  Chronic Illness - Malnutrition Diagnosis: Severe malnutrition.     ASSESSMENT:     11/2: Pt with poor/fair meal intake. Flavor preference of nutrition supplement specified. Nutrition focused physical exam conducted. Daughter present at time of visit    11/1: Pt very lethargic and difficult to understand during visit. Pt reports not feeling hungry and per MD note daughter states only sips of water x 3 weeks PTA. No ht or weight documented in chart at this time, used information from prior admission to calculate nutrition needs. Plan for hospice/pallative consult, per MD note. Renal function improving.    Average po intake adequate to meet patients estimated nutritional needs:    Yes      No    Unable to determine at this time    Diet: DIET MECHANICAL SOFT  DIET NUTRITIONAL SUPPLEMENTS All Meals; [place] Allergies: NKFA   Current Appetite:    Good      Fair      Poor      Other:  Appetite/meal intake prior to admission:    Good      Fair      Poor (x 3 weeks PTA per daughter report)     Other:  Feeding Limitations:   Swallowing difficulty     Chewing difficulty: Edentulous     Other:  Current Meal Intake:   Patient Vitals for the past 100 hrs:   % Diet Eaten   03/07/16 1600 50 %   03/07/16 1200 50 %   03/07/16 0800 25 %       BM:  11/2  Skin Integrity: no pressure ulcer or injury noted  Edema: none   Pertinent Medications: Reviewed    Recent Labs      03/07/16   0141  03/06/16   1155  03/05/16   1452   NA  145  143  138   K  3.3*  3.9  4.3   CL  113*  109*  103   CO2  _0 GLU  82  170*  188*   BUN  25*  33*  65*   CREA  0.67  0.96  1.49*   CA  8.5  9.4  9.6   MG   --    --   1.9   ALB   --   3.5  3.5   SGOT   --    --   15   ALT   --    --   12*       Intake/Output Summary (Last 24 hours) at 03/07/16 1805  Last data filed at 03/07/16 1600   Gross per 24 hour   Intake             1770 ml   Output             1100 ml   Net              670 ml        Last HBA1C: 6.6% (11/2015)    Anthropometrics:  Ht Readings from Last 1 Encounters:   03/07/16 4' 11.84" (1.52 m)     Last 3 Recorded Weights in this Encounter    03/06/16 1737 03/07/16 0553   Weight: 37.4 kg (82 lb 8 oz) 37.1 kg (81 lb 12.8 oz)     Body mass index is 16.06 kg/(m^2).      Underweight    Weight History:  Per daughter report, pt weighed 90 lb in July 2017; unplanned wt loss of 9 lb (10%) x 4 months PTA    Weight Metrics 03/07/2016 12/06/2015 11/22/2015 11/21/2015   Weight 81 lb 12.8 oz 84 lb 1.6 oz 88 lb -   BMI 16.06 kg/m2 16.42 kg/m2 - 17.19 kg/m2        Admitting Diagnosis: Acute kidney injury ([place])  Acute kidney injury ([place])  Pertinent PMHx: HTN, GERD, DM2    Education Needs:         None identified   Identified - Not appropriate at this time    Identified and addressed - refer to education log  Learning Limitations:    None identified   Identified     Cultural, religious & ethnic food preferences:   None identified     Identified and addressed     ESTIMATED NUTRITION NEEDS:     Calories: 1150-1610 kcal (25-35 kcal/kg) based on   Actual BW       IBW: 46 kg   Protein: 30-44 gm (0.8-1.2 gm/kg) based on   Actual BW: 37 kg       IBW   Fluid: 1 mL/kcal     MONITORING & EVALUATION:     Nutrition Goal(s):   1. Po intake of meals will meet >75% of patient estimated nutritional needs within the next 7 days.  Outcome:   Met/Ongoing      Not Met     New/Initial Goal     Monitoring:    Diet tolerance    Meal intake    Supplement intake    GI symptoms/ability to tolerate po diet    Respiratory status    Plan of care      Previous Recommendations (for follow-up assessments only):        Implemented          Not Implemented (RD to address)       No Longer Appropriate      No Recommendation Made     Discharge Planning:  Regular, consistency pending ability to tolerate   Participated in care planning, discharge planning, & interdisciplinary rounds as appropriate      [place], [place]   Pager: [phone]

## 2016-03-07 NOTE — Progress Notes (Signed)
*ATTENTION:  This note has been created by a medical student for educational purposes only.  Please do not refer to the content of this note for clinical decision-making, billing, or other purposes.  Please see attending physician???s note to obtain clinical information on this patient.*                                                          Med Student Progress Note  [facility]       Patient: [name] LittlerKatherine Iglesias MRN: [id number]  CSN: [id number]    Date of Birth: [birth year]  Age: 82 y.o.  Sex: female    DOA: 03/05/2016 LOS:  LOS: 2 days                    Subjective:     Acute events:  Pt awake and alert this morning, denies any pain, SOB, or nausea. When asked if she has headaches she says 'sometimes,' but when asked if she currently has HA she is unable to clarify. Hgb down, 10.8>8.2, daughter denies seeing any blood per rectum, though pt's most recent BM was 2 days prior to this admission.      Brief ROS: No abd pain, N/V, gross rectal bleeding, CP, or SOB.      Objective:      Patient Vitals for the past 24 hrs:   Temp Pulse Resp BP SpO2   03/07/16 0700 98 ??F (36.7 ??C) 86 16 134/52 100 %   03/07/16 0553 98.5 ??F (36.9 ??C) 78 16 133/88 99 %   03/07/16 0017 98.1 ??F (36.7 ??C) 88 16 123/68 99 %   03/06/16 2051 98.5 ??F (36.9 ??C) 68 16 (!) 152/94 99 %   03/06/16 1605 97.1 ??F (36.2 ??C) 90 16 135/66 97 %   03/06/16 1000 - - - 124/58 100 %   03/06/16 0930 - - - 128/42 100 %       Physical Exam:   Constitutional: Cachectic. Awake, alert, NAD.  HEENT: MMM  CV: RRR, SEM present  Resp: lungs CTAB  Abdomen: Emaciated, soft, nontender, nondistended. No palpable mass  Rectal: No palpable mass.  +hemoccult  Extremities: warm, well-perfused, no edema.  DP pulses 2+ bilaterally      Lab/Data Reviewed:  CBC: 6.3/8.2/25.8/157, H&H down from 11.0&34.7 yesterday  BMP: 145/3.3/113/24/25/0.67/82    Scheduled Medications Reviewed:  Current Facility-Administered Medications   Medication Dose Route Frequency    ??? potassium chloride (KLOR-CON) packet 30 mEq  30 mEq Oral NOW   ??? influenza vaccine 2017-18 (3 yrs+)(PF) (FLUZONE QUAD/FLUARIX QUAD) injection 0.5 mL  0.5 mL IntraMUSCular PRIOR TO DISCHARGE   ??? docusate sodium (COLACE) capsule 100 mg  100 mg Oral DAILY   ??? ferrous sulfate tablet 325 mg  1 Tab Oral TID WITH MEALS   ??? famotidine (PEPCID) tablet 20 mg  20 mg Oral QHS   ??? 0.9% sodium chloride infusion  75 mL/hr IntraVENous CONTINUOUS         Assessment/Plan     82 y/o female with PMH of HTN, GERD, anemia, malnutrition, and UTI, admitted for AKI.    AKI: Resolving, Creatinine down to 0.67  -Continue IVF while NPO  -Daily BMP  -Urine culture pending    Malnutrition:  -Per nutritionist consult today, pt to hav Glucerna  TID and NS@75  while PO intake is low  -Monitor daily BMP    Anemia: Acute decrease in H&H, 11.0/34.7>8.2/25.8, Hemoccult positive on exam today, no gross rectal bleeding.  -Continue home iron supplementation and daily CBC  -Intervention pending based on hospice discussion later today    Dispo:  -Hospice nurse to evaluate pt this morning  -Will call pt's daughter to discuss options later today (daughter works until 3:30 and plans to return to the hospital after that time)      Diet: Regular, Glucerna TID  DVT Prophylaxis: N/A  Code Status: DNR  Point of Contact: Daughter     Disposition: Remain in hospital.    [name] Juarez   03/07/2016, 8:39 AM

## 2016-03-07 NOTE — Progress Notes (Signed)
Bedside shift report given to [facility]ae[facility],RN, Report included, MAR, Kardex, SBAR, and recent results.

## 2016-03-07 NOTE — Discharge Summary (Signed)
Discharge Summary by [name] Juarez, [name] Millikin T, MD at 03/07/16 1927                Author: Joan Juarez, Shaelynn Dragos T, MD  Service: FAMILY MEDICINE  Author Type: Resident       Filed: 03/09/16 1239  Date of Service: 03/07/16 1927  Status: Signed           Editor: [name] Juarez, [name] Crewe T, MD (Resident)  Cosigner: [name] Juarez, Stanley, MD at 03/12/16 1408                       Discharge Summary   [facility]              [name]  Age: 82 y.o.   Sex: female   DOB: April 20, 1922           MRN: [id number]           DOA: 03/05/2016           Discharge Date: 03/09/2016           Attending:Stanley Georgeanna LeaBrittman, MD           PCP: Pershing ProudStephen Brawley, MD              ================================================================      Reason for Admission:    Acute kidney injury [facility](HCC)      Discharge Diagnoses:    AKI (resolved)   HTN (controlled on medications)   GERD (stable)   Anemia (chronic, iron deficient, poor PO intake)   Recurrent UTI (resolved)   Hypokalemia (resolved)      Important notes to PCP/ follow-up studies and evaluations    -Pt's daughter is POA and has signed a DNR at the hospital.   -Pt has poor PO intake, she has passed swallow eval and has no signs or symptoms of obstruction. She will eat if fed but often does not like to feed herself. She has 4/5 strength in both upper extremities.      Pending labs and studies:   None   Operative Procedures:    None      Discharge Medications:      Current Discharge Medication List            CONTINUE these medications which have NOT CHANGED        Details      amLODIPine (NORVASC) 10 mg tablet  Take 10 mg by mouth daily. Indications: hypertension            hydrALAZINE (APRESOLINE) 10 mg tablet  Take 10 mg by mouth two (2) times a day.            docusate sodium (COLACE) 100 mg capsule  Take 100 mg by mouth daily. Indications: constipation            raNITIdine (ZANTAC) 300 mg tablet  Take 300 mg by mouth nightly.            ferrous sulfate (IRON)  325 mg (65 mg iron) EC tablet  Take 325 mg by mouth three (3) times daily (with meals).            traMADol (ULTRAM) 50 mg tablet  Take 50 mg by mouth every six (6) hours as needed for Pain.                               Disposition: Hospice      Consultants:  None      Brief Hospital Course (including pertinent history  and physical findings)   Pt was sent to ED on 10/31 by PCP d/Juarez dehydration. Pt's daughter wanted her to be admitted d/Juarez difficulty caring for her and pt's poor PO intake. Pt was noted to have AKI in ED. Pt had also had stable  weight over last 3 months but had lost 18lb the 3 months before.       AKI 2/2 dehydration: Cr: 1.49>0.96>0.67>0.57. Pt resuscitated with NS @ 7175mL/hr, which was then switched to D5 1/2 NS. Pt remained asymptomatic.      UTI: UA unconcerning when pt was admitted, however ucx was done and showed UTI. Pt was being given fluids. Pt remained asymptomatic so abx  not given.      Anemia: Pt was given iron supplementation during admission as part of her regular regimen but H&H kept decreasing. Repeat H&Hs were reassuring      Hypokalemia: K repleted and was added to IVF. Repeat K was wnl.      Summarized key findings and results (labs, imaging studies, ECHO, cardiac cath, endoscopies, etc):   Blood cx: NG2D   Urine cx: 20K colonies/mL Gm- rods   Hgb: 10.8>11.0>8.2>8.8>9.1>7.4   K: 4.3>3.9>3.3>2.9   Cr: 1.49>0.96>0.67>0.57      Functional status and cognitive function:     Ambulates slowly and is unsteady. Rolling walker ordered.    Status: alert, cooperative, no distress, appears stated age      Diet: Regular soft      Code status and advanced care plan: DNR   Power Of Acadia Montanattorney Point of Contact:Daughter               [name] Juarez           [phone]         Follow-up:      Follow-up Information        Follow up With  Details  Comments  Contact Info             Pershing ProudStephen Brawley, MD    As needed  [address]   [address]   [place] [id number]   [phone]                       ================================================================   [name] OsgoodJoanne Jameka Ivie, MD, PGY-1   [facility]   [facility]   03/09/16 7:27 PM

## 2016-03-08 LAB — CBC W/O DIFF
HCT: 23.6 % — ABNORMAL LOW (ref 35.0–45.0)
HCT: 28.6 % — ABNORMAL LOW (ref 35.0–45.0)
HGB: 7.4 g/dL — ABNORMAL LOW (ref 12.0–16.0)
HGB: 9.3 g/dL — ABNORMAL LOW (ref 12.0–16.0)
MCH: 23.3 PG — ABNORMAL LOW (ref 24.0–34.0)
MCH: 24.2 PG (ref 24.0–34.0)
MCHC: 31.4 g/dL (ref 31.0–37.0)
MCHC: 32.5 g/dL (ref 31.0–37.0)
MCV: 74.2 FL (ref 74.0–97.0)
MCV: 74.5 FL (ref 74.0–97.0)
MPV: 9.9 FL (ref 9.2–11.8)
MPV: 9.9 FL (ref 9.2–11.8)
PLATELET: 145 10*3/uL (ref 135–420)
PLATELET: 169 10*3/uL (ref 135–420)
RBC: 3.18 M/uL — ABNORMAL LOW (ref 4.20–5.30)
RBC: 3.84 M/uL — ABNORMAL LOW (ref 4.20–5.30)
RDW: 14.3 % (ref 11.6–14.5)
RDW: 14.3 % (ref 11.6–14.5)
WBC: 5.4 10*3/uL (ref 4.6–13.2)
WBC: 6.7 10*3/uL (ref 4.6–13.2)

## 2016-03-08 LAB — METABOLIC PANEL, BASIC
Anion gap: 7 mmol/L (ref 3.0–18)
BUN/Creatinine ratio: 25 — ABNORMAL HIGH (ref 12–20)
BUN: 14 MG/DL (ref 7.0–18)
CO2: 26 mmol/L (ref 21–32)
Calcium: 8 MG/DL — ABNORMAL LOW (ref 8.5–10.1)
Chloride: 110 mmol/L — ABNORMAL HIGH (ref 100–108)
Creatinine: 0.57 MG/DL — ABNORMAL LOW (ref 0.6–1.3)
GFR est AA: >60 mL/min/{1.73_m2} (ref >60)
GFR est non-AA: >60 mL/min/{1.73_m2} (ref >60)
Glucose: 130 mg/dL — ABNORMAL HIGH (ref 74–99)
Potassium: 2.9 mmol/L — CL (ref 3.5–5.5)
Sodium: 143 mmol/L (ref 136–145)

## 2016-03-08 LAB — POTASSIUM: Potassium: 3.7 mmol/L (ref 3.5–5.5)

## 2016-03-08 MED ORDER — POTASSIUM CHLORIDE 2 MEQ/ML IV SOLN
2 mEq/mL | INTRAVENOUS | Status: AC
Start: 2016-03-08 — End: 2016-03-08
  Administered 2016-03-08 (×5): via INTRAVENOUS

## 2016-03-08 MED FILL — FERROUS SULFATE 325 MG (65 MG ELEMENTAL IRON) TAB: 325 mg (65 mg iron) | ORAL | Qty: 1

## 2016-03-08 MED FILL — POTASSIUM CHLORIDE 2 MEQ/ML IV SOLN: 2 mEq/mL | INTRAVENOUS | Qty: 5

## 2016-03-08 MED FILL — DOCUSATE SODIUM 100 MG CAP: 100 mg | ORAL | Qty: 1

## 2016-03-08 MED FILL — FAMOTIDINE 20 MG TAB: 20 mg | ORAL | Qty: 1

## 2016-03-08 NOTE — Other (Deleted)
Please clarify if you concur with dietitian documentation.  Please clarify if this patient is being treated/managed for:    =>Severe protein calorie malnutriiton  =>Other Explanation of clinical findings  =>Unable to Determine (no explanation of clinical findings)    The medical record reflects the following:  Risk:  --Poor PO intake- chronic    Clinical Indicators:  --dietitian progress notes:   ??? Severe Protein Calorie Malnutrition as evidenced by:   ASPEN Malnutrition Criteria  Acute Illness, Chronic Illness, or Social/Enviornmental: Chronic illness  Weight Loss: Greater than 10% x 6 mos  Muscle Mass: Severe (depression in temples and hands; thin calves)  ASPEN Malnutrition Score - Chronic Illness: 12  Chronic Illness - Malnutrition Diagnosis: Severe malnutrition.    ??? Inadequate oral intake related to decreased appetite as evidenced by poor/minimal po intake PTA, per MD note daughter reports only consuming water x 3 weeks PTA   ??? Average po intake adequate to meet patients estimated nutritional needs:    No      ??? Body mass index is 16.06     Treatment:   --dietitian following:   ??? Monitor and encourage po intake   ??? Continue diet order and supplements, specify flavor preference for nutrition supplement   ??? Continue IVF hydration until adequate po intake   ??? Continue RD inpatient monitoring and evaluation    Please clarify and document your clinical opinion in the progress notes and discharge summary including the definitive and/or presumptive diagnosis, (suspected or probable), related to the above clinical findings. Please include clinical findings supporting your diagnosis.    If you DECLINE this query or would like to communicate with CDMP, please utilize the "CDMP message box" at the TOP of the Progress Note on the right.      Thank you,  Gevena MartLea Moore RN, CDIP  [phone]

## 2016-03-08 NOTE — Progress Notes (Signed)
Intern Progress Note  [facility]       Patient: Keron Koffman MRN: [id number]  CSN: [id number]    Date of Birth: Sep 19, 1921  Age: 82 y.o.  Sex: female    DOA: 03/05/2016 LOS:  LOS: 3 days                    Subjective:     Acute events: Pt resting in bed. No complaints. Pt reports feeling hungry and that she likes the milkshakes. No complaints of fever, chills, HA, dizziness, CP, palp, SOB, cough, abd pain, N/V, edema. Pt still has not had BM but has poor PO intake    Review of Systems   Constitutional: Negative for chills and fever.   Respiratory: Negative for cough and shortness of breath.    Cardiovascular: Negative for chest pain, palpitations and leg swelling.   Gastrointestinal: Negative for abdominal pain, nausea and vomiting.   Genitourinary: Negative for dysuria and hematuria.   Neurological: Negative for dizziness and headaches.       Objective:      Patient Vitals for the past 24 hrs:   Temp Pulse Resp BP SpO2   03/08/16 0317 98.6 ??F (37 ??C) 74 16 163/62 100 %   03/08/16 0003 98.5 ??F (36.9 ??C) 84 17 154/82 100 %   03/07/16 1909 98.8 ??F (37.1 ??C) 84 17 154/71 98 %   03/07/16 1624 98.1 ??F (36.7 ??C) 78 16 164/74 99 %   03/07/16 1159 97.7 ??F (36.5 ??C) 82 16 136/63 100 %   03/07/16 0800 - - - - 100 %         Intake/Output Summary (Last 24 hours) at 03/08/16 0751  Last data filed at 03/08/16 0239   Gross per 24 hour   Intake              870 ml   Output             1200 ml   Net             -330 ml       Physical Exam   Constitutional: No distress.   HENT:   Head: Normocephalic and atraumatic.   Eyes: EOM are normal.   Neck: Normal range of motion. Neck supple. No thyromegaly present.   Cardiovascular: Normal rate and regular rhythm.    Murmur heard.  Pulmonary/Chest: Effort normal and breath sounds normal.   Abdominal: Soft. She exhibits no distension. Bowel sounds are decreased. There is no tenderness.   Musculoskeletal: She exhibits no edema or tenderness.   Lymphadenopathy:      She has no cervical adenopathy.   Skin: Skin is warm and dry. She is not diaphoretic.   Hypopigmented/depigmented macules on all extremities   Psychiatric: She has a normal mood and affect. Her behavior is normal.   Neuro: alert, cooperative, responsive, only oriented to person    Lab/Data Reviewed:  Recent Results (from the past 24 hour(s))   HGB & HCT    Collection Time: 03/07/16  1:35 PM   Result Value Ref Range    HGB 9.1 (L) 12.0 - 16.0 g/dL    HCT 95.6 (L) 21.3 - 45.0 %   METABOLIC PANEL, BASIC    Collection Time: 03/08/16  1:56 AM   Result Value Ref Range    Sodium 143 136 - 145 mmol/L    Potassium 2.9 (LL) 3.5 - 5.5 mmol/L    Chloride 110 (H) 100 - 108  mmol/L    CO2 26 21 - 32 mmol/L    Anion gap 7 3.0 - 18 mmol/L    Glucose 130 (H) 74 - 99 mg/dL    BUN 14 7.0 - 18 MG/DL    Creatinine 1.610.57 (L) 0.6 - 1.3 MG/DL    BUN/Creatinine ratio 25 (H) 12 - 20      GFR est AA >60 >60 ml/min/1.7173m2    GFR est non-AA >60 >60 ml/min/1.10973m2    Calcium 8.0 (L) 8.5 - 10.1 MG/DL   CBC W/O DIFF    Collection Time: 03/08/16  1:56 AM   Result Value Ref Range    WBC 5.4 4.6 - 13.2 K/uL    RBC 3.18 (L) 4.20 - 5.30 M/uL    HGB 7.4 (L) 12.0 - 16.0 g/dL    HCT 09.623.6 (L) 04.535.0 - 45.0 %    MCV 74.2 74.0 - 97.0 FL    MCH 23.3 (L) 24.0 - 34.0 PG    MCHC 31.4 31.0 - 37.0 g/dL    RDW 40.914.3 81.111.6 - 91.414.5 %    PLATELET 145 135 - 420 K/uL    MPV 9.9 9.2 - 11.8 FL       Scheduled Medications Reviewed:  Current Facility-Administered Medications   Medication Dose Route Frequency   ??? potassium chloride 10 mEq, lidocaine (PF) (XYLOCAINE) 10 mg/mL (1 %) 1 mL in 0.9% sodium chloride 100 mL IVPB   IntraVENous Q1H   ??? dextrose 5 % - 0.45% NaCl infusion  75 mL/hr IntraVENous CONTINUOUS   ??? influenza vaccine 2017-18 (3 yrs+)(PF) (FLUZONE QUAD/FLUARIX QUAD) injection 0.5 mL  0.5 mL IntraMUSCular PRIOR TO DISCHARGE   ??? docusate sodium (COLACE) capsule 100 mg  100 mg Oral DAILY   ??? ferrous sulfate tablet 325 mg  1 Tab Oral TID WITH MEALS    ??? famotidine (PEPCID) tablet 20 mg  20 mg Oral QHS         Imaging, microbiology, and EKG/Telemetry:      Assessment/Plan     82 y.o.??female??with PMH HTN, GERD, anemia hx of UTI now admitted for Acute kidney Injury   ????  Acute kidney injury: Cr. 1.49>0.96>0.67. Baseline 0.7. Patient given 2 L bolus in the ED  - IVF: D5 1/2NS??20 KCl 75cc/hr  - urine cx/blood cx pending  - fall precautions  ????  Poor PO intake- chronic, TSH 0.54  - ensure/glucerna??with all meals  - case management- FU with hospice  - pt/ot- home health w/ rolling walker  - nutrition consulted- appreciate recs  - encourage PO    Hypokalemia: K:2.9, likely d/t poor PO intake  - Add 20KCl to IVF  - Replete electrolytes as necessary  ????  Hx of iron def anemia: labs in 2016 showed TIBC 232, fe- 25, iron sat 11%  - will continue home iron supplementation  ????  HTN:??well controlled  - will hold diuretic htn meds  - monitor vital signs  ????  GERD  - continue home pepcid 20mg  daily  ????  DM type 2:??last A1c- 6.6 7/17 diet ??Controlled not on meds outpatient  - daily bmp  - if glucose elevated will initiate SSI  ????  ????  ????  Diet: regular  DVT Prophylaxis: lovenox  Code Status: DNR  Point of Contact: Daughter               Venida JarvisMadie Lopes           [phone]    LOS and Disposition: possible DC  to hospice today    Germain OsgoodJoanne Harvie Morua, MD, PGY-1  [facility]  [facility]  03/08/16 7:51 AM

## 2016-03-08 NOTE — Other (Signed)
1930-Bedside and Verbal shift change report given to Cassidy RN (oncoming nurse) by Emeka Ozoh RN (offgoing nurse). Report included the following information SBAR, Kardex, ED Summary, MAR and Recent Results.

## 2016-03-08 NOTE — Progress Notes (Addendum)
Brief Note    Reviewed [name] Juarez's recent H/H and K level. Both are acceptable for discharge. Regarding urine culture with 20,000 GNR, Dr. Georgeanna LeaBrittman advised [name] Juarez to not treat as this is not clinically relevant.    Will sign D/C summary and reconcile patient's medicines. Will await placement by CM. Once placement is confirmed, I will place the D/C order.    If [name] Juarez does not end up being placed today, then I will D/C lab draws for tomorrow morning as they are not necessary.    Nestor RampK. Gary Kadisha Goodine, MD, PGY-2  [facility]  March 08, 2016 2:39 PM      ADDENDUM  Discussed case with [name] Juarez (CM). Patient will be discharged tomorrow.     Nestor RampK. Gary Elora Wolter, MD, PGY-2  [facility]  March 08, 2016 4:47 PM

## 2016-03-08 NOTE — Progress Notes (Signed)
Late Entry 03/07/2016    [facility]'s daughter at [facility] information on placement.  SW spoke with the daughter. She stated she would like for her mother to receive hospice services in a NH with [facility].  SW contacted [facility]isa [facility].  She stated they "work closely" with several facilities, but will be able to go to any facility that is chosen.  They plan on meeting with the daughter 03/08/2016 at 2pm.  At that time they will review SNF list provided by this SW.      SW will assist in transferring the patient to a NH.  Patient already has Medicare/Medicaid in place.  SW will complete [facility].

## 2016-03-08 NOTE — Progress Notes (Addendum)
OT order received and chart reviewed.    0805: 1st attempt for OT evaluation; pt currently off the floor.  [id number]: MD present, pt also eating breakfast.     Will follow up.    Haynes Kernsaina Paolini, MS OTR/L

## 2016-03-08 NOTE — Other (Signed)
1909 Assumed care of patient from off going nurse. Patient resting in bed. No distress noted. Call bell within reach, siderails up x 3, bed in lowest position, and patient instructed to use call bell for assistance. Will continue to monitor. Bed alarm activated at this time.    0710 TRANSFER - OUT REPORT:    Verbal report given to [facility] RN (name) on [name]  being transferred to 4 N (unit) for routine progression of care       Report consisted of patient???s Situation, Background, Assessment and   Recommendations(SBAR).     Information from the following report(s) Kardex, Intake/Output, MAR and Recent Results was reviewed with the receiving nurse.    Lines:   Peripheral IV 12/06/15 Right Forearm (Active)       Peripheral IV 03/05/16 Right Antecubital (Active)   Site Assessment Clean, dry, & intact 03/07/2016  7:09 PM   Phlebitis Assessment 0 03/07/2016  7:09 PM   Infiltration Assessment 0 03/07/2016  7:09 PM   Dressing Status Clean, dry, & intact 03/07/2016  7:09 PM   Dressing Type Transparent 03/07/2016  7:09 PM   Hub Color/Line Status Pink;Infusing;Flushed 03/07/2016  7:09 PM        Opportunity for questions and clarification was provided.      Patient transported with:   Tech     [phone] Patient's daughter notified of new room location.

## 2016-03-08 NOTE — Progress Notes (Signed)
[facility] today due to access to charts and records.  Will accept the patient tomorrow.  MD informed.  Patient's daughter informed.  [facility] informed.

## 2016-03-08 NOTE — Progress Notes (Signed)
[facility]  Nija notified and patient placed in CC link.

## 2016-03-08 NOTE — Other (Signed)
Interdisciplinary Round Note   Patient Information:   [name] Juarez   [id number]   Reason for Admission: Acute kidney injury (HCC)  Acute kidney injury [facility](HCC)   Attending Provider:   Caryl AdaStanley Brittman, MD  Primary Care Physician:       Pershing ProudStephen Brawley, MD       [phone]   Estimated discharge date:  03/09/16   Hospital day: 3  @GLOS @  2d 7h  RRAT Score: Medium Risk            16       Total Score        3 Has Seen PCP in Last 6 Months (Yes=3, No=0)    4 IP Visits Last 12 Months (1-3=4, 4=9, >4=11)    9 Pt. Coverage (Medicare=5 , Medicaid, or Self-Pay=4)        Criteria that do not apply:    Married. Living with Significant Other. Assisted Living. LTAC. SNF. or   Rehab    Patient Length of Stay (>5 days = 3)    Charlson Comorbidity Score (Age + Comorbid Conditions)       normal sinus rhythm     No Restraint.         Mechanical      Lines, Drains, & Airways  None       IV Antibiotics:    Current Antimicrobial Therapy     None        GI Prophylaxis: GI Prophylaxis: no   Type:       Recent Glucose Results:   Lab Results   Component Value Date/Time    GLU 130 (H) 03/08/2016 01:56 AM      Activity Level:  Activity Level: Up with Assistance    Needs assistance with ADLs: no       Goals for Today: Increase po intake   Recommendations:   Discharge Disposition: SNF  Nutrition  Palliative Care evaluation    Needs for Discharge: TRansportation Recommendations from IDR team: Appetite Stimulant    Other Notes: None

## 2016-03-08 NOTE — Progress Notes (Incomplete)
*  ATTENTION:  This note has been created by a medical student for educational purposes only.  Please do not refer to the content of this note for clinical decision-making, billing, or other purposes.  Please see attending physician???s note to obtain clinical information on this patient.*                                                          Med Student Progress Note  [facility]       Patient: [name] LittlerKatherine Juarez MRN: [id number]  CSN: [id number]    Date of Birth: 02/18/1922  Age: 82 y.o.  Sex: female    DOA: 03/05/2016 LOS:  LOS: 3 days                    Subjective:     Acute events: ***    ***    Brief ROS: ***    Objective:      Patient Vitals for the past 24 hrs:   Temp Pulse Resp BP SpO2   03/08/16 0317 98.6 ??F (37 ??C) 74 16 163/62 100 %   03/08/16 0003 98.5 ??F (36.9 ??C) 84 17 154/82 100 %   03/07/16 1909 98.8 ??F (37.1 ??C) 84 17 154/71 98 %   03/07/16 1624 98.1 ??F (36.7 ??C) 78 16 164/74 99 %   03/07/16 1159 97.7 ??F (36.5 ??C) 82 16 136/63 100 %   03/07/16 0800 - - - - 100 %   03/07/16 0700 98 ??F (36.7 ??C) 86 16 134/52 100 %       Physical Exam:   {Exam, Complete:30010499:p}    Lab/Data Reviewed:  {LABS REVIEWED:30012220}     Scheduled Medications Reviewed:  Current Facility-Administered Medications   Medication Dose Route Frequency   ??? potassium chloride 10 mEq, lidocaine (PF) (XYLOCAINE) 10 mg/mL (1 %) 1 mL in 0.9% sodium chloride 100 mL IVPB   IntraVENous Q1H   ??? dextrose 5 % - 0.45% NaCl infusion  75 mL/hr IntraVENous CONTINUOUS   ??? influenza vaccine 2017-18 (3 yrs+)(PF) (FLUZONE QUAD/FLUARIX QUAD) injection 0.5 mL  0.5 mL IntraMUSCular PRIOR TO DISCHARGE   ??? docusate sodium (COLACE) capsule 100 mg  100 mg Oral DAILY   ??? ferrous sulfate tablet 325 mg  1 Tab Oral TID WITH MEALS   ??? famotidine (PEPCID) tablet 20 mg  20 mg Oral QHS         Imaging, microbiology, and EKG/Telemetry:  ***    Assessment/Plan     82 y/o female with PMH of anemia, HTN, GERD, and UTI now admitted for AKI.     Acute Kidney Injury:    Nutrition status:    Anemia:    HTN:    GERD:    DMII:      Diet: Mechanical soft, Glucerna shake with all meals  DVT Prophylaxis: ***  Code Status: DNR  Point of Contact: Daughter    Disposition: ***    [name] Juarez   03/08/2016, 6:49 AM

## 2016-03-08 NOTE — Progress Notes (Addendum)
[id number]- Received patient in bed awake and alert, but not knowing how long she was transferred. IVF bag kept  in the bed. Patient reposition in bed iv fluid restarted.  Pt. Lying comfortable in bed.    [id number]- walked patient to the bathroom, she voided in the commode. Assisted patient back to bed.    1430- Family member at bedside visiting with the patient. No change in patient status.    1900- patient lying in bed resting quietly bed alarm on.

## 2016-03-09 LAB — CULTURE, URINE
Culture result:: 20000 — AB
Culture result:: 30000
Culture: 30000

## 2016-03-09 MED ORDER — LABETALOL 5 MG/ML IV SOLN
5 mg/mL | INTRAVENOUS | Status: DC | PRN
Start: 2016-03-09 — End: 2016-03-09

## 2016-03-09 MED ORDER — HYDRALAZINE 20 MG/ML IJ SOLN
20 mg/mL | INTRAMUSCULAR | Status: DC | PRN
Start: 2016-03-09 — End: 2016-03-09

## 2016-03-09 MED FILL — DOCUSATE SODIUM 100 MG CAP: 100 mg | ORAL | Qty: 1

## 2016-03-09 MED FILL — FAMOTIDINE 20 MG TAB: 20 mg | ORAL | Qty: 1

## 2016-03-09 MED FILL — FLUARIX QUAD 2017-2018 (PF) 60 MCG (15 MCG X 4)/0.5 ML IM SYRINGE: 60 mcg (15 mcg x 4)/0.5 mL | INTRAMUSCULAR | Qty: 0.5

## 2016-03-09 MED FILL — FERROUS SULFATE 325 MG (65 MG ELEMENTAL IRON) TAB: 325 mg (65 mg iron) | ORAL | Qty: 1

## 2016-03-09 NOTE — Progress Notes (Signed)
Report was called to [facility]rina Pegues [facility] at [phone]

## 2016-03-09 NOTE — Progress Notes (Signed)
Went in to check on pt and her IV was on the floor. Pt is supposed to be d/c today and does not have any IV meds of fluids. Called and informed MD. MD verbalized acknowledgment and said it was okay to leave pt's IV out.

## 2016-03-09 NOTE — Progress Notes (Signed)
Problem: Mobility Impaired (Adult and Pediatric)  Goal: *Acute Goals and Plan of Care (Insert Text)  Physical Therapy Goals  Initiated 03/06/2016 and to be accomplished within 7 day(s)  1.  Patient will move from supine to sit and sit to supine  in bed with modified independence.     2.  Patient will transfer from bed to chair and chair to bed with modified independence using the least restrictive device.  3.  Patient will perform sit to stand with modified independence.  4.  Patient will ambulate with modified independence for 150 feet with the least restrictive device.   5.  Patient will ascend/descend 3 stairs with 1 handrail(s) with minimal assistance/contact guard assist.   physical Therapy TREATMENT    [name] (96(82 y.o. female)  Date: 03/09/2016  Diagnosis: Acute kidney injury (HCC)  Acute kidney injury (HCC) Acute kidney injury (HCC)       Precautions:     Chart, physical therapy assessment, plan of care and goals were reviewed.    ASSESSMENT:  Pt found supine in bed with HOB elevated, awake and alert. Pt pleasant and willing to work with therapy. Pt performed bed mobility with Supervision, requiring additional time to reach seated EOB. Pt performed seated therex slowly but well. Pt able to perform sit to stand to RW with CGA for safety. Pt amb ~100 ft with RW with SBA. Pt returned supine in bed with Supervision and lunch placed in front with bed alarm active. Nursing reports pt going home today with hospice.   Progression toward goals:        Improving appropriately and progressing toward goals        Improving slowly and progressing toward goals        Not making progress toward goals and plan of care will be adjusted     Mobility  G8979 Goal  CI= 1-19% G8980 D/C  CI= 1-19%.  The severity rating is based on the Level of Assistance required for Functional Mobility and ADLs.      PLAN:  Patient continues to benefit from skilled intervention to address the  above impairments.  Continue treatment per established plan of care.  Discharge Recommendations:  Home Health with Hospice  Further Equipment Recommendations for Discharge:  N/A     SUBJECTIVE:   Patient stated ???I'm alright today. I could get up I suppose.???    OBJECTIVE DATA SUMMARY:   Critical Behavior:  Neurologic State: Confused, Alert  Orientation Level: Oriented to person, Disoriented to situation, Disoriented to place, Disoriented to time   Functional Mobility Training:  Bed Mobility:  Rolling: Supervision  Supine to Sit: Supervision;Additional time  Sit to Supine: Supervision;Additional time  Scooting: Supervision;Additional time   Transfers:  Sit to Stand: Contact guard assistance  Stand to Sit: Stand-by asssistance  Balance:  Sitting: Intact  Standing: Intact;With support  Standing - Static: Fair  Standing - Dynamic : Fair  Ambulation/Gait Training:  Distance (ft): 100 Feet (ft)  Assistive Device: Walker, rolling  Ambulation - Level of Assistance: Stand-by asssistance  Gait Description (WDL): Exceptions to WDL  Gait Abnormalities: Decreased step clearance  Speed/Cadence: Slow  Therapeutic Exercises:   Seated: marches, LAQ  Reps: 10  Pain:  Pain Scale 1: Numeric (0 - 10)  Pain Intensity 1: 0   Activity Tolerance:   Fair+  Please refer to the flowsheet for vital signs taken during this treatment.  After treatment:    Patient left in no apparent distress sitting up  in chair   Patient left in no apparent distress in bed    Call bell left within reach   Nursing notified   Caregiver present   Bed alarm activated      [name]   Time Calculation: 23 mins

## 2016-03-09 NOTE — Progress Notes (Signed)
Bedside and Verbal shift change report given to Danielle, RN  (oncoming nurse) by Cassidy, LPN (offgoing nurse). Report included the following information SBAR, Kardex, Intake/Output and MAR.

## 2016-03-09 NOTE — Progress Notes (Addendum)
SW contacted [facility] to determine when patient can be sent to facility.  VML with Nija in admissions as well.  Awaiting call back form facility for time patient can be transported.     1100: Spoke with Nija.  Requesting patient be transported around 1400-1500.    SW attempted to schedule transport for the patient through her [employer] CCCP who denied transport stating they do not cover transport from hospital to hospital (even though she is going to a NH).  SW called [facility] to schedule through her Medicare.  Transport scheduled for 1430.  [facility] notified of transport time. Patient's daughter informed as well.  Family member would like to change transport to 1530.

## 2016-03-09 NOTE — Progress Notes (Signed)
Intern Progress Note  [facility]       [name] MRN: [id number]  CSN: [id number]    Date of Birth: Apr 01, 1922  Age: 82 y.o.  Sex: female    DOA: 03/05/2016 LOS:  LOS: 4 days                    Subjective:     Acute events: Pt resting in bed. Eating breakfast with assistance. No complaints. No dysuria, no N/V, no CP, no SOB    Review of Systems   Constitutional: Negative for chills and fever.   HENT: Negative for hearing loss.    Eyes: Negative for blurred vision and double vision.   Respiratory: Negative for cough and shortness of breath.    Cardiovascular: Negative for chest pain and palpitations.   Gastrointestinal: Negative for abdominal pain, constipation, diarrhea, nausea and vomiting.   Genitourinary: Negative for dysuria, hematuria and urgency.   Neurological: Negative for dizziness and headaches.       Objective:      Patient Vitals for the past 24 hrs:   Temp Pulse Resp BP SpO2   03/09/16 0754 96.9 ??F (36.1 ??C) - - - -   03/09/16 0400 98.1 ??F (36.7 ??C) 75 18 134/57 100 %   03/08/16 2344 96.8 ??F (36 ??C) 80 17 153/68 100 %   03/08/16 2000 97.3 ??F (36.3 ??C) 80 17 148/59 100 %   03/08/16 1200 98.1 ??F (36.7 ??C) 98 16 163/76 100 %   03/08/16 0800 97 ??F (36.1 ??C) 83 16 159/65 98 %       No intake or output data in the 24 hours ending 03/09/16 0756    Physical Exam   Constitutional: No distress.   HENT:   Head: Normocephalic and atraumatic.   Eyes: EOM are normal. Pupils are equal, round, and reactive to light.   Neck: Normal range of motion. Neck supple. No thyromegaly present.   Cardiovascular: Normal rate and regular rhythm.    Murmur heard.  Pulmonary/Chest: Effort normal and breath sounds normal.   Abdominal: Soft. Bowel sounds are normal. She exhibits no distension. There is no tenderness.   Musculoskeletal: She exhibits no edema or tenderness.   Lymphadenopathy:     She has no cervical adenopathy.   Neurological: She is alert.    Oriented only to person, responsive, able to follow commands   Skin: Skin is warm and dry. She is not diaphoretic.   Psychiatric: She has a normal mood and affect. Her behavior is normal.       Lab/Data Reviewed:  Recent Results (from the past 24 hour(s))   POTASSIUM    Collection Time: 03/08/16  1:03 PM   Result Value Ref Range    Potassium 3.7 3.5 - 5.5 mmol/L   CBC W/O DIFF    Collection Time: 03/08/16  1:03 PM   Result Value Ref Range    WBC 6.7 4.6 - 13.2 K/uL    RBC 3.84 (L) 4.20 - 5.30 M/uL    HGB 9.3 (L) 12.0 - 16.0 g/dL    HCT 74.228.6 (L) 59.535.0 - 45.0 %    MCV 74.5 74.0 - 97.0 FL    MCH 24.2 24.0 - 34.0 PG    MCHC 32.5 31.0 - 37.0 g/dL    RDW 63.814.3 75.611.6 - 43.314.5 %    PLATELET 169 135 - 420 K/uL    MPV 9.9 9.2 - 11.8 FL  Scheduled Medications Reviewed:  Current Facility-Administered Medications   Medication Dose Route Frequency   ??? influenza vaccine 2017-18 (3 yrs+)(PF) (FLUZONE QUAD/FLUARIX QUAD) injection 0.5 mL  0.5 mL IntraMUSCular PRIOR TO DISCHARGE   ??? docusate sodium (COLACE) capsule 100 mg  100 mg Oral DAILY   ??? ferrous sulfate tablet 325 mg  1 Tab Oral TID WITH MEALS   ??? famotidine (PEPCID) tablet 20 mg  20 mg Oral QHS         Imaging, microbiology, and EKG/Telemetry:      Assessment/Plan     82 y.o.??female??with PMH HTN, GERD, anemia hx of UTI now admitted for Acute kidney Injury   ????  Acute kidney injury: Cr. 1.49>0.96>0.67. Baseline 0.7. Patient given 2 L bolus in the ED; UCX: 20K proteus  - fall precautions  ????  Poor PO intake- chronic, TSH 0.54  - ensure/glucerna??with all meals  - case management- FU with hospice  - pt/ot- home health w/ rolling walker  - nutrition consulted- appreciate recs  - encourage PO  ??  Hypokalemia: K:2.9, likely d/t poor PO intake  - Replete electrolytes as necessary  ????  Hx of iron def anemia: labs in 2016 showed TIBC 232, fe- 25, iron sat 11%  - will continue home iron supplementation  ????  HTN:??well controlled  - will hold diuretic htn meds  - monitor vital signs  ????   GERD  - continue home pepcid 20mg  daily  ????  DM type 2:??last A1c- 6.6 7/17 diet ??Controlled not on meds outpatient  - daily bmp  - if glucose elevated will initiate SSI  ????  ????  ????  Diet: regular  DVT Prophylaxis: lovenox  Code Status: DNR  Point of Contact: Daughter??????????????????????????????Madie Erbes??????????????????????[phone]    [name] OsgoodJoanne Onesimo Lingard, MD, PGY-1  [facility]  [facility]  03/09/16 7:56 AM

## 2016-03-09 NOTE — Progress Notes (Signed)
Responding to CDMP Query: Pt treated for severe protein malnutrition per nutrition recommendation.

## 2016-03-09 NOTE — Progress Notes (Signed)
*ATTENTION:  This note has been created by a medical student for educational purposes only.  Please do not refer to the content of this note for clinical decision-making, billing, or other purposes.  Please see attending physician???s note to obtain clinical information on this patient.*                                                          Med Student Progress Note  [facility]       [name] MRN: [id number]  CSN: [id number]    Date of Birth: [birth year]  Age: 82 y.o.  Sex: female    DOA: 03/05/2016 LOS:  LOS: 4 days                    Subjective:     Acute events: Pt sitting up in bed this morning, the most alert I've seen her since admission.  Just ate a moderate amount of food for breakfast.  Tolerated well, no abd pain or N/V.      Brief ROS: Denies fevers, CP, cough, SOB, abd pain, or N/V.    Objective:      Patient Vitals for the past 24 hrs:   Temp Pulse Resp BP SpO2   03/09/16 0754 96.9 ??F (36.1 ??C) 77 16 - 99 %   03/09/16 0400 98.1 ??F (36.7 ??C) 75 18 134/57 100 %   03/08/16 2344 96.8 ??F (36 ??C) 80 17 153/68 100 %   03/08/16 2000 97.3 ??F (36.3 ??C) 80 17 148/59 100 %   03/08/16 1200 98.1 ??F (36.7 ??C) 98 16 163/76 100 %       Physical Exam:   General: Pt awake, alert, orientedX2.  Sitting up in bed, answering questions.  HEENT: MMM  CV: RRR, SEM present  Resp: Lungs CTAB  Abdomen: soft, nontender, nondistended  Extremities: Warm, well-perfused, no edema.    Lab/Data Reviewed:    No new labs in past 12 hours.    Scheduled Medications Reviewed:  Current Facility-Administered Medications   Medication Dose Route Frequency   ??? influenza vaccine 2017-18 (3 yrs+)(PF) (FLUZONE QUAD/FLUARIX QUAD) injection 0.5 mL  0.5 mL IntraMUSCular PRIOR TO DISCHARGE   ??? docusate sodium (COLACE) capsule 100 mg  100 mg Oral DAILY   ??? ferrous sulfate tablet 325 mg  1 Tab Oral TID WITH MEALS   ??? famotidine (PEPCID) tablet 20 mg  20 mg Oral QHS         Imaging, microbiology, and EKG/Telemetry:   Urine culture grew 20K proteus.      Assessment/Plan     82 y/o female, PMH of anemia, HTN, GERD, and UTI, admitted to hospital for dehydration and AKI.    AKI: Resolved, Creatinine down to 0.57 yesterday.  Pt's baseline is 0.7.    Malnutrition: Improving.  Pt now eating when fed.  Nutritionist on board.  Hypokalemia episode yesterday morning, and K improved to 3.7 following repletion.  -Monitor BMP if pt remains in hospital another day, replete as necessary  -Continue assisting pt with feeding at meals  -Glucerna TID    HTN: Home Amlodipine and Hydralazine held 2/2 diuretic effect.    -Systolic BP up to 150s and 160s yesterday  -Consider adding back Amlodipine 10mg ?    GERD:  -Daily Pepcid 20mg   Diet-controlled DMII:  -BGL has been WNL or just above normal (past 2 days: 82>130)  -Continue to monitor, adding Insulin if needed      Diet: To be D/C'd to hospice today.  DVT Prophylaxis: N/A  Code Status: DNR  Point of Contact: Daughter    Disposition: D/C today.  Hospice was arranged yesterday, but there was power outage so transfer held until today.    [name] DukesMary Rose Juarez   03/09/2016, 9:00 AM

## 2016-03-09 NOTE — Progress Notes (Signed)
Provider Manson PasseyBrown paged about patient blood pressure 166/72 and no scheduled or PRN blood pressure medications. Provider said to continue to monitor and to call if systolic blood pressure is greater than 180.

## 2016-03-12 LAB — CULTURE, BLOOD
Culture result:: NO GROWTH
Culture result:: NO GROWTH

## 2016-04-02 ENCOUNTER — Emergency Department: Admit: 2016-04-02 | Payer: MEDICARE | Primary: Internal Medicine

## 2016-04-02 ENCOUNTER — Inpatient Hospital Stay
Admit: 2016-04-02 | Discharge: 2016-04-04 | Disposition: A | Discharge status: Skilled Nursing Facility | Payer: MEDICARE | Attending: Family Medicine | Admitting: Family Medicine

## 2016-04-02 DIAGNOSIS — S72001A Fracture of unspecified part of neck of right femur, initial encounter for closed fracture: Secondary | ICD-10-CM

## 2016-04-02 MED ORDER — SODIUM CHLORIDE 0.9 % IV
Freq: Once | INTRAVENOUS | Status: DC
Start: 2016-04-02 — End: 2016-04-02

## 2016-04-02 MED FILL — SODIUM CHLORIDE 0.9 % IV: INTRAVENOUS | Qty: 1000

## 2016-04-02 NOTE — ED Triage Notes (Signed)
[facility] after a right hip fx occurring on Sunday.

## 2016-04-02 NOTE — Consults (Signed)
[facility]    Consult Note    [name]               Sex: female          DOA: 04/02/2016         Date of Birth:  1922/03/19      Age:  82 y.o.        LOS:  LOS: 0 days              HPI:     [name] Juarez is a 82 y.o. female who has been seen for right hip pain. She has a history of dementia, DMII, HTN, iron deficiency anemia, and constipation presenting with right hip pain.  Patient fell at her nursing facility two days ago.  Patient has obvious deformity of the hip and was [facility] ED for evaluation. Patient was found to have a displaced fracture of the right femoral neck. History limited due to patient's history of dementia. Of note, patient is a [facility]; discharged on Hospice earlier this month.  Courtesy admission to our service due to former PCP's request.    Past Medical History:   Diagnosis Date   ??? Arthritis    ??? Diabetes (HCC)    ??? Hypertension        Past Surgical History:   Procedure Laterality Date   ??? HX GYN      hysterectomy       History reviewed. No pertinent family history.    Social History     Social History   ??? Marital status: SINGLE     Spouse name: N/A   ??? Number of children: N/A   ??? Years of education: N/A     Social History Main Topics   ??? Smoking status: Never Smoker   ??? Smokeless tobacco: Never Used   ??? Alcohol use No   ??? Drug use: None   ??? Sexual activity: Not Currently     Other Topics Concern   ??? None     Social History Narrative       Prior to Admission medications    Medication Sig Start Date End Date Taking? Authorizing Provider   amLODIPine (NORVASC) 10 mg tablet Take 10 mg by mouth daily. Indications: hypertension    Historical Provider   hydrALAZINE (APRESOLINE) 10 mg tablet Take 10 mg by mouth two (2) times a day.    Historical Provider   docusate sodium (COLACE) 100 mg capsule Take 100 mg by mouth daily. Indications: constipation    Historical Provider    raNITIdine (ZANTAC) 300 mg tablet Take 300 mg by mouth nightly.    Historical Provider   ferrous sulfate (IRON) 325 mg (65 mg iron) EC tablet Take 325 mg by mouth three (3) times daily (with meals).    Historical Provider   traMADol (ULTRAM) 50 mg tablet Take 50 mg by mouth every six (6) hours as needed for Pain.    Historical Provider       No Known Allergies    Review of Systems  Negative for any fever, chills, sweats, headache, blurred vision, cough, congestion, SOB, abdominal pain, bleeding, bruising, numbness, or tingling. 12 point ROS otherwise negative other than noted in the HPI      Physical Exam:      Visit Vitals   ??? BP 157/64 (BP 1 Location: Right arm, BP Patient Position: Sitting)   ??? Pulse Marland Kitchen(!)  110   ??? Temp 98 ??F (36.7 ??C)   ??? Resp (!) 110   ??? Wt 80 lb (36.3 kg)   ??? SpO2 100%   ??? BMI 15.71 kg/m2       Physical Exam:  General: Well developed, well nourished, 82 y.o. female in  NAD   Vitals: Blood pressure 157/64, pulse (!) 110, temperature 98 ??F (36.7 ??C), resp. rate (!) 110, weight 80 lb (36.3 kg), SpO2 100 %.   Skin: No rashs, ulcers, icterus, or other obvious lesions/ lacerations  Eyes: EOM intact, PERRLA   ENM: Without obvious lesions. Nares patent. Moist mucous membranes.   Neck: Supple, FROM   Lungs: CTAB   Heart: RRR, normal S1, S2. No murmurs, rubs, or gallops  Abdomen: Soft, NT, bowel sounds present all 4 quadrants   Musculoskeletal: pt rolled onto right side, unable to exam due to pain. Motor and sensory intact, 2+ distal pulses  Neuro: AAOx3. Without obvious deficits     Labs Reviewed:  BMP:   Lab Results   Component Value Date/Time    NA 138 04/03/2016 01:05 AM    K 3.0 (L) 04/03/2016 01:05 AM    CL 102 04/03/2016 01:05 AM    CO2 32 04/03/2016 01:05 AM    AGAP 4 04/03/2016 01:05 AM    GLU 55 (L) 04/03/2016 01:05 AM    BUN 11 04/03/2016 01:05 AM    CREA 0.73 04/03/2016 01:05 AM    GFRAA >60 04/03/2016 01:05 AM    GFRNA >60 04/03/2016 01:05 AM     CBC:   Lab Results    Component Value Date/Time    WBC 9.0 04/03/2016 01:05 AM    HGB 8.8 (L) 04/03/2016 01:05 AM    HCT 27.8 (L) 04/03/2016 01:05 AM    PLT 286 04/03/2016 01:05 AM       Assessment/Plan   @DIAGMED @  Active Problems:    * No active hospital problems. *      Pt. Stable  Pt. NPO x meds   Consent Pt. For right hip hemi arthroplasty   I discussed the risks and benefits and potential adverse outcomes of both operative vs non operative treatment of right hip fracture with the patient.      Risks of operative intervention include but not limited to bleeding, infection, deep vein thrombosis, pulmonary embolism, death, limb length discrepancy, reflexive sympathetic dystrophy, fat embolism syndrome,damage to blood vessels and nerves, malunion, non-union, delayed union, failure of hardware, post traumatic arthritis, stroke, heart attack, and death.      Patient understands that infection may arise and may require numerous surgeries.    Medical consultation for Pre-/post-operative medical care.  Pre-Op labs Ordered    Daniel NonesBrandee L Mikita Lesmeister, PA-C   [facility]   [phone]

## 2016-04-02 NOTE — H&P (Addendum)
Admission History and Physical  [facility]      [name] MRN: [id number]  CSN: [id number]    Date of Birth: 1921-06-23  Age: 82 y.o.  Sex: female      DOA: 04/02/2016       HPI:     [name] LittlerKatherine Marchetta is a 82 y.o. female with PMHx of dementia, HTN, DMII, GERD, iron deficiency anemia, constipation, and osteoarthritis, now presenting with complaint of right hip pain. Patient is a permanent resident of [facility] and fell at the nursing facility on Sunday. [facility] to [facility] ED for evaluation and was noted to have obvious deformity of right hip on physical examination. Imaging showed patient has a displaced fracture of right femoral neck.    HPI further limited due patient history of underlying dementia.    Patient was admitted on 03/05/2016 and discharged on 03/09/2016 to [facility] on hospice. Courtesy admission to our service due to former PCP's request.    ED Course: CBC, BMP, UA w/micro, Troponin-I, PT/INR, Type&Screen, CXR, XR Right Hip, EKG    Review of Systems - unable to obtain due to patient's underlying dementia      Past Medical History:   Diagnosis Date   ??? Arthritis    ??? Diabetes (HCC)    ??? Hypertension        Past Surgical History:   Procedure Laterality Date   ??? HX GYN      hysterectomy       History reviewed. No pertinent family history.    Social History     Social History   ??? Marital status: SINGLE     Spouse name: N/A   ??? Number of children: N/A   ??? Years of education: N/A     Social History Main Topics   ??? Smoking status: Never Smoker   ??? Smokeless tobacco: Never Used   ??? Alcohol use No   ??? Drug use: None   ??? Sexual activity: Not Currently     Other Topics Concern   ??? None     Social History Narrative       No Known Allergies    Prior to Admission Medications   Prescriptions Last Dose Informant Patient Reported? Taking?   amLODIPine (NORVASC) 10 mg tablet Unknown at Unknown time  Yes No    Sig: Take 10 mg by mouth daily. Indications: hypertension   docusate sodium (COLACE) 100 mg capsule Unknown at Unknown time  Yes No   Sig: Take 100 mg by mouth daily. Indications: constipation   ferrous sulfate (IRON) 325 mg (65 mg iron) EC tablet Unknown at Unknown time  Yes No   Sig: Take 325 mg by mouth three (3) times daily (with meals).   hydrALAZINE (APRESOLINE) 10 mg tablet Unknown at Unknown time  Yes No   Sig: Take 10 mg by mouth two (2) times a day.   raNITIdine (ZANTAC) 300 mg tablet Unknown at Unknown time  Yes No   Sig: Take 300 mg by mouth nightly.   traMADol (ULTRAM) 50 mg tablet Unknown at Unknown time  Yes No   Sig: Take 50 mg by mouth every six (6) hours as needed for Pain.      Facility-Administered Medications: None       Physical Exam:     Patient Vitals for the past 24 hrs:   Temp Pulse Resp BP SpO2   04/02/16 2115 - - - - 99 %  04/02/16 2100 - - - - 100 %   04/02/16 2000 - - - - 100 %   04/02/16 1945 - - - - 100 %   04/02/16 1930 - - - - 100 %   04/02/16 1915 - - - - 99 %   04/02/16 1800 98 ??F (36.7 ??C) (!) 110 (!) 110 157/64 100 %       Physical Exam:  General:  Alert and Responsive and in o acute distress.   HEENT: Conjunctiva pink, sclera anicteric. EOMI.  Pharynx moist, nonerythematous.  Moist mucous membranes.  Thyroid not enlarged, no nodules.  No cervical, supraclavicular, occipital or submandibular lymphadenopathy.  No other gross abnormalities present.  CV:  Tachycardia, no murmurs/rubs/gallops. No visible pulsations or thrills.    RESP:  Unlabored breathing.  Lungs clear to auscultation. no wheeze, rales, or rhonchi.  Equal expansion bilaterally.    ABD:  Soft, nontender, nondistended.   No hepatosplenomegaly.  No suprapubic tenderness.  No CVA tenderness.  MS:  Obvious deformity/displacement of right hip, tender to palpation.  Neuro:  A&O x1 (self only). 4/5 strength bilateral upper extremities and lower extremities. Sensation grossly intact.   Ext:  No edema. 2+ radial and dp pulses bilaterally.  Skin:  No rashes, lesions, or ulcers.  Good turgor.    IMAGING:     XR Right Hip: Displaced right femoral neck fracture.    CXR Results  (Last 48 hours)               04/02/16 1900  XR CHEST SNGL V Final result    Impression:  IMPRESSION:       Atherosclerosis.           Narrative:  Chest One View portable 1849 hours       CPT CODE: [id number]       HISTORY: Acute displaced right hip fracture status post fall prior to arrival.       COMPARISON: Chest x-ray March 06, 2016.       FINDINGS:        One view obtained. The cardiac silhouette is normal. Calcified plaque is   demonstrated within the arch of the aorta which is mildly  tortuous.   The lungs are clear. Pulmonary vascularity is normal. The costophrenic angles   are sharply defined. Bilateral glenohumeral and AC joint space narrowing with   spurring.                 Recent Results (from the past 12 hour(s))   CBC WITH AUTOMATED DIFF    Collection Time: 04/02/16  9:33 PM   Result Value Ref Range    WBC 8.4 4.6 - 13.2 K/uL    RBC 3.60 (L) 4.20 - 5.30 M/uL    HGB 8.6 (L) 12.0 - 16.0 g/dL    HCT 91.426.9 (L) 78.235.0 - 45.0 %    MCV 74.7 74.0 - 97.0 FL    MCH 23.9 (L) 24.0 - 34.0 PG    MCHC 32.0 31.0 - 37.0 g/dL    RDW 95.615.1 (H) 21.311.6 - 14.5 %    PLATELET 275 135 - 420 K/uL    MPV 9.6 9.2 - 11.8 FL    NEUTROPHILS 72 40 - 73 %    LYMPHOCYTES 16 (L) 21 - 52 %    MONOCYTES 11 (H) 3 - 10 %    EOSINOPHILS 1 0 - 5 %    BASOPHILS 0 0 - 2 %    ABS. NEUTROPHILS 6.1  1.8 - 8.0 K/UL    ABS. LYMPHOCYTES 1.3 0.9 - 3.6 K/UL    ABS. MONOCYTES 0.9 0.05 - 1.2 K/UL    ABS. EOSINOPHILS 0.1 0.0 - 0.4 K/UL    ABS. BASOPHILS 0.0 0.0 - 0.06 K/UL    DF AUTOMATED     METABOLIC PANEL, BASIC    Collection Time: 04/02/16  9:33 PM   Result Value Ref Range    Sodium 137 136 - 145 mmol/L    Potassium 3.5 3.5 - 5.5 mmol/L    Chloride 99 (L) 100 - 108 mmol/L    CO2 32 21 - 32 mmol/L    Anion gap 6 3.0 - 18 mmol/L    Glucose 179 (H) 74 - 99 mg/dL     BUN 12 7.0 - 18 MG/DL    Creatinine 1.47 0.6 - 1.3 MG/DL    BUN/Creatinine ratio 18 12 - 20      GFR est AA >60 >60 ml/min/1.15m2    GFR est non-AA >60 >60 ml/min/1.21m2    Calcium 8.8 8.5 - 10.1 MG/DL   TROPONIN I    Collection Time: 04/02/16  9:33 PM   Result Value Ref Range    Troponin-I, Qt. 0.05 (H) 0.0 - 0.045 NG/ML   TYPE & SCREEN    Collection Time: 04/02/16  9:33 PM   Result Value Ref Range    Crossmatch Expiration 04/05/2016     ABO/Rh(D) A POSITIVE     Antibody screen NEG    PROTHROMBIN TIME + INR    Collection Time: 04/02/16  9:33 PM   Result Value Ref Range    Prothrombin time 12.6 11.5 - 15.2 sec    INR 1.0 0.8 - 1.2     URINALYSIS W/ RFLX MICROSCOPIC    Collection Time: 04/02/16  9:45 PM   Result Value Ref Range    Color YELLOW      Appearance CLEAR      Specific gravity 1.018 1.005 - 1.030      pH (UA) 6.5 5.0 - 8.0      Protein 30 (A) NEG mg/dL    Glucose >8295 (A) NEG mg/dL    Ketone NEGATIVE  NEG mg/dL    Bilirubin NEGATIVE  NEG      Blood NEGATIVE  NEG      Urobilinogen 0.2 0.2 - 1.0 EU/dL    Nitrites NEGATIVE  NEG      Leukocyte Esterase NEGATIVE  NEG     URINE MICROSCOPIC ONLY    Collection Time: 04/02/16  9:45 PM   Result Value Ref Range    WBC 0 to 1 0 - 4 /hpf    RBC NEGATIVE  0 - 5 /hpf    Epithelial cells NEGATIVE  0 - 5 /lpf    Bacteria NEGATIVE  NEG /hpf       Assessment/Plan:   82 y.o. female with PMHx of dementia, HTN, DMII, GERD, iron deficiency anemia, constipation, and osteoarthritis, now admitted with displaced fracture of right femoral neck.    Displaced Right Femoral Neck Fracture. XR Right Hip showed Displaced right femoral neck fracture. Obvious right hip deformity on physical examination.  - Fall precautions  - Ortho consulted, plan for surgical intervention on 11/29  - Regular diet now, NPO @MN   - Complete bedrest, consult PT/OT after surgery  - IVF: D5-1/2 NS w/KCl 20 @75ml /hr    Iron def anemia: Iron studies in 2016 showed TIBC 232, fe- 25, iron sat  11%. Baseline hemoglobin  9; admission hemoglobin 8.6  - Daily CBC  - Transfuse if Hgb <7  - Continue ferrous sulfate 325 mg TID w/meals  ??  HTN:??BP range 150s/60s.  - Will hold home hydralazine 10 mg BID + amlodipine 10 mg daily  - Monitor vital signs per unit routine  ??  GERD  - Continue pepcid 40 mg daily  ????  T2DM:??last A1c 6.6 (7/17). Diet controlled, not on any medications.  - SSI + Accuchecks ACHS  - Daily BMP  - F/u Hgb A1c    Constipation  - Continue home docusate 100 mg daily    Osteoarthritis  - Continue home tramadol 50 mg q6h prn       Diet: Regular Diet (NPO @midnight )  DVT Prophylaxis: SCDs  Code Status: DNR  Point of Contact: Charelle Petrakis [phone] (Relationship: Daughter)    Disposition and anticipated LOS: >2 midnights      Danny Lawless, MD, PGY-1  [facility]  04/02/2016

## 2016-04-02 NOTE — ED Provider Notes (Signed)
EMERGENCY DEPARTMENT HISTORY AND PHYSICAL EXAM    7:24 PM      Date: 04/02/2016  Patient Name: [name] Juarez    History of Presenting Illness     No chief complaint on file.        History Provided By: Patient    Chief Complaint: Right Hip Fx  Duration: 2 Days  Timing:  Constant  Location: Right Hip  Quality: Limited info due to age  Severity: Moderate  Modifying Factors: None  Associated Symptoms: Limited info due to age      Additional History (Context): [name] Juarez is a 82 y.o. female with diabetes, hypertension and arthritis who presents with right hip fracture after falling two days ago. Admitted on 03/05/16 for acute kidney injury. [facility]. HPI limited due to age.     PCP: Pershing ProudStephen Brawley, MD    Current Facility-Administered Medications   Medication Dose Route Frequency Provider Last Rate Last Dose   ??? 0.9% sodium chloride infusion  75 mL/hr IntraVENous ONCE Sabra HeckPaul J Roszko, MD         Current Outpatient Prescriptions   Medication Sig Dispense Refill   ??? amLODIPine (NORVASC) 10 mg tablet Take 10 mg by mouth daily. Indications: hypertension     ??? hydrALAZINE (APRESOLINE) 10 mg tablet Take 10 mg by mouth two (2) times a day.     ??? docusate sodium (COLACE) 100 mg capsule Take 100 mg by mouth daily. Indications: constipation     ??? raNITIdine (ZANTAC) 300 mg tablet Take 300 mg by mouth nightly.     ??? ferrous sulfate (IRON) 325 mg (65 mg iron) EC tablet Take 325 mg by mouth three (3) times daily (with meals).     ??? traMADol (ULTRAM) 50 mg tablet Take 50 mg by mouth every six (6) hours as needed for Pain.         Past History     Past Medical History:  Past Medical History:   Diagnosis Date   ??? Arthritis    ??? Diabetes (HCC)    ??? Hypertension        Past Surgical History:  Past Surgical History:   Procedure Laterality Date   ??? HX GYN      hysterectomy       Family History:  No family history on file.    Social History:  Social History   Substance Use Topics   ??? Smoking status: Never Smoker    ??? Smokeless tobacco: Never Used   ??? Alcohol use No       Allergies:  No Known Allergies      Review of Systems     Review of Systems   Unable to perform ROS: Age         Physical Exam     Visit Vitals   ??? BP 157/64 (BP 1 Location: Right arm, BP Patient Position: Sitting)   ??? Pulse (!) 110   ??? Temp 98 ??F (36.7 ??C)   ??? Resp (!) 110   ??? Wt 36.3 kg (80 lb)   ??? SpO2 100%   ??? BMI 15.71 kg/m2         Physical Exam   Constitutional: Vital signs are normal. She appears well-developed and well-nourished. She does not appear ill. No distress.   HENT:   Head: Atraumatic.   Mouth/Throat: Uvula is midline, oropharynx is clear and moist and mucous membranes are normal. Mucous membranes are not dry. No trismus in the jaw. Normal  dentition.   Eyes: Pupils are equal, round, and reactive to light. Right conjunctiva is not injected. Left conjunctiva is not injected. No scleral icterus. Right eye exhibits normal extraocular motion and no nystagmus. Left eye exhibits normal extraocular motion and no nystagmus.   2mL bilateral   Neck: Trachea normal, full passive range of motion without pain and phonation normal. Neck supple. No tracheal deviation present.   Cardiovascular: Normal rate, regular rhythm, S1 normal, S2 normal and normal pulses.   No extrasystoles are present.   No murmur heard.  Pulses:       Radial pulses are 2+ on the right side, and 2+ on the left side.   Pulmonary/Chest: No accessory muscle usage or stridor. No tachypnea. No respiratory distress. She has no decreased breath sounds. She has no wheezes. She has no rhonchi. She has no rales.   Abdominal: Soft. She exhibits no distension and no ascites. There is no hepatosplenomegaly. There is no tenderness. There is no rebound, no guarding and no CVA tenderness.   Musculoskeletal: She exhibits no edema or deformity.        Right shoulder: She exhibits no swelling (no pedal edema).   No pedal edema. Right leg shortened and externally rotated. Pelvis stable  but tender to palpation on right side.    Lymphadenopathy:     She has no cervical adenopathy.   Neurological: She is alert. No cranial nerve deficit.   Skin: Skin is warm and dry. No rash noted. She is not diaphoretic. No cyanosis. No pallor.   Psychiatric: She has a normal mood and affect. She is not actively hallucinating. Thought content is not paranoid and not delusional. Cognition and memory are not impaired.         Diagnostic Study Results     Labs -  No results found for this or any previous visit (from the past 12 hour(s)).    Radiologic Studies -   XR CHEST SNGL V   Final Result      XR HIP RT W OR WO PELV 2-3 VWS   Final Result        CXR:  Atherosclerosis.  ??  XR Hip RT W OR WO Pelv 2-3 VWS:  ??  Displaced right femoral neck fracture.    Medical Decision Making   I am the first provider for this patient.    I reviewed the vital signs, available nursing notes, past medical history, past surgical history, family history and social history.    Vital Signs-Reviewed the patient's vital signs.    Pulse Oximetry Analysis -  100% on room air (Nml)    Records Reviewed: Old Medical Records, Previous electrocardiograms, Previous Radiology Studies and Previous Laboratory Studies (Time of Review: 7:24 PM)    ED Course: Progress Notes, Reevaluation, and Consults:    Provider Notes (Medical Decision Making): Ms. Juarez is a 82 year old female with a history of dementia, recent admission to [facility] for UTI, who presents to the ED for evaluation of a right hip fracture. Had a fall at [facility] on Sunday (2 days ago) and had an x-ray done that showed the fracture. She is stable here with tenderness on palpation of the right hip with shortening and external rotation of the right lower extremity. She is comfortable at rest and non-ambulatory. Case discussed with Dr. Idelle Crouch. Spoke with her daughter to update with patient's status. Will admit to hospitalist and have her evaluated tomorrow for surgical fixation     Discussed  case and patient status with daughter, Crisoforo OxfordMadie - [phone]    8:33 PM : Pt care transferred to Dr. Camie Patienceates  ,ED provider. History of patient complaint(s), available diagnostic reports and current treatment plan has been discussed thoroughly.   Bedside rounding on patient occured : yes .  Intended disposition of patient : ADMIT  Pending diagnostics reports and/or labs (please list):       For Hospitalized Patients:    1. Hospitalization Decision Time:  The decision to hospitalize the patient was made by Dr. Langley GaussScott Brown at 8:28 PM   on 04/02/2016    2. Aspirin: Aspirin was not given because the patient did not present with a stroke at the time of their Emergency Department evaluation    Diagnosis     Clinical Impression:   1. Closed fracture of neck of right femur, initial encounter (HCC)      Disposition: Admit    Follow-up Information     None           Patient's Medications   Start Taking    No medications on file   Continue Taking    AMLODIPINE (NORVASC) 10 MG TABLET    Take 10 mg by mouth daily. Indications: hypertension    DOCUSATE SODIUM (COLACE) 100 MG CAPSULE    Take 100 mg by mouth daily. Indications: constipation    FERROUS SULFATE (IRON) 325 MG (65 MG IRON) EC TABLET    Take 325 mg by mouth three (3) times daily (with meals).    HYDRALAZINE (APRESOLINE) 10 MG TABLET    Take 10 mg by mouth two (2) times a day.    RANITIDINE (ZANTAC) 300 MG TABLET    Take 300 mg by mouth nightly.    TRAMADOL (ULTRAM) 50 MG TABLET    Take 50 mg by mouth every six (6) hours as needed for Pain.   These Medications have changed    No medications on file   Stop Taking    No medications on file     _______________________________    Attestations:  Scribe Attestation     Gibson RampJames Lau acting as a scribe for and in the presence of Sabra HeckPaul J Roszko, MD      April 02, 2016 at 7:24 PM       Provider Attestation:      I personally performed the services described in the documentation,  reviewed the documentation, as recorded by the scribe in my presence, and it accurately and completely records my words and actions. April 02, 2016 at 7:24 PM - Sabra HeckPaul J Roszko, MD    _______________________________

## 2016-04-02 NOTE — ED Notes (Signed)
Received report from Nav[place]helby, [place]. Pt. Resting quietly with eyes closed in NAD. Siderails up.

## 2016-04-02 NOTE — Progress Notes (Signed)
Famotidine was therapeutically interchanged for  ranitidine per the P&T Committee approved Production assistant, radioTherapeutic Interchanges Policy.

## 2016-04-02 NOTE — H&P (Signed)
Senior Addendum to History and Physical  [facility]      [name] MRN: [id number]  CSN: [id number]    Date of Birth: 1921-11-18  Age: 82 y.o.  Sex: female      Admission Date: 04/02/2016       HPI:     [name] Juarez is a 82 y.o. female with history of dementia, DMII, HTN, iron deficiency anemia, and constipation presenting with right hip pain.  Patient fell at her nursing facility two days ago.  Patient has obvious deformity of the hip and [facility]. Patient was found to have a displaced fracture of the right femoral neck.     History limited due to patient's history of dementia.    Of note, patient is a [facility]; discharged on Hospice earlier this month.  Courtesy admission to our service due to former PCP's request.      Please see intern note for PMH, PSH, FamHx, SocHx, Allergies, Meds    Physical Exam:   Patient Vitals for the past 12 hrs:   Temp Pulse Resp BP SpO2   04/02/16 1800 98 ??F (36.7 ??C) (!) 110 (!) 110 157/64 100 %       General:  Alert and Responsive and in No acute distress.   HEENT: Conjunctiva pink, sclera anicteric.  PERRL.  EOMI.  Pharynx moist, nonerythematous.  Moist mucous membranes.  No cervical, supraclavicular, occipital or submandibular lymphadenopathy.  No other gross abnormalities present.  CV:  RRR, 4/6 systolic murmur. No visible pulsations or thrills.    RESP:  Unlabored breathing.  Lungs clear to auscultation. No wheezes, rales, or rhonchi.  Equal expansion bilaterally.    ABD:  Soft, nontender, nondistended.    MSK:  Obvious deformity/displacement of right hip, tender to palpation.   Neuro:  4/5 strength bilateral upper extremities and lower extremities.  Sensation grossly intact.  Alert, oriented to self only.  Ext:  No edema.  2+ radial and dp pulses bilaterally.  Skin:  No rashes, lesions, or ulcers.  Good turgor.      Assessment/Plan:    82 y.o. female with history of dementia, DMII, HTN, iron deficiency anemia, and constipation admitted with displaced right femoral neck fracture.      Displaced right femoral neck fracture:   - Plan for intervention with Ortho on 11/29  - Regular diet currently, NPO past midnight  - Complete bedrest; PT/OT after surgery  - Fall precautions  - D5-1/2NS + 20K at 75cc/h    HTN:  - Holding amlodipine, BP controlled    DMII, diet controlled:   - Last HbA1C 6.6% (11/2015)  - SSI, accuchecks achs    Iron deficiency anemia, baseline 9-10:   - Continue ferrous sulfate TID; hold while NPO    GERD:   - Continue ranitidine; hold while NPO      Mgmt of other chronic conditions per intern note    Juel BurrowSatara Alexa Blish, MD  PGY3, [facility]  04/02/2016

## 2016-04-02 NOTE — Other (Signed)
TRANSFER - OUT REPORT:    Verbal report given to [name] SheldonAshley, RN on [name] Juarez  being transferred to [id number] for routine progression of care       Report consisted of patient???s Situation, Background, Assessment and   Recommendations(SBAR).     Information from the following report(s) SBAR was reviewed with the receiving nurse.    Lines:       Opportunity for questions and clarification was provided.      Patient transported with:   Tech   In house transport

## 2016-04-02 NOTE — Consults (Signed)
[facility]    Consult Note    Patient: [name] LittlerKatherine [place]              Sex: female          DOA: 04/02/2016         Date of Birth:  [birth year]      Age:  82 y.o.        LOS:  LOS: 0 days              HPI:     [name] Juarez is a 82 y.o. female who has been seen for right hip pain. She has a history of dementia, DMII, HTN, iron deficiency anemia, and constipation presenting with right hip pain.  Patient fell at her nursing facility two days ago.  Patient has obvious deformity of the hip and was [facility] to [facility] ED for [facility]. Patient was found to have a displaced fracture of the right femoral neck. History limited due to patient's history of dementia. Of note, [facility] is a [facility]; discharged on Hospice earlier this month.  Courtesy admission to our service due to former PCP's request.    Past Medical History:   Diagnosis Date   ??? Arthritis    ??? Diabetes (HCC)    ??? Hypertension        Past Surgical History:   Procedure Laterality Date   ??? HX GYN      hysterectomy       History reviewed. No pertinent family history.    Social History     Social History   ??? Marital status: SINGLE     Spouse name: N/A   ??? Number of children: N/A   ??? Years of education: N/A     Social History Main Topics   ??? Smoking status: Never Smoker   ??? Smokeless tobacco: Never Used   ??? Alcohol use No   ??? Drug use: None   ??? Sexual activity: Not Currently     Other Topics Concern   ??? None     Social History Narrative       Prior to Admission medications    Medication Sig Start Date End Date Taking? Authorizing Provider   amLODIPine (NORVASC) 10 mg tablet Take 10 mg by mouth daily. Indications: hypertension    Historical Provider   hydrALAZINE (APRESOLINE) 10 mg tablet Take 10 mg by mouth two (2) times a day.    Historical Provider   docusate sodium (COLACE) 100 mg capsule Take 100 mg by mouth daily. Indications: constipation    Historical Provider   raNITIdine (ZANTAC) 300 mg  tablet Take 300 mg by mouth nightly.    Historical Provider   ferrous sulfate (IRON) 325 mg (65 mg iron) EC tablet Take 325 mg by mouth three (3) times daily (with meals).    Historical Provider   traMADol (ULTRAM) 50 mg tablet Take 50 mg by mouth every six (6) hours as needed for Pain.    Historical Provider       No Known Allergies    Review of Systems  Negative for any fever, chills, sweats, headache, blurred vision, cough, congestion, SOB, abdominal pain, bleeding, bruising, numbness, or tingling. 12 point ROS otherwise negative other than noted in the HPI      Physical Exam:      Visit Vitals   ??? BP 157/64 (BP 1 Location: Right arm, BP Patient Position: Sitting)   ??? Pulse Marland Kitchen(!)  110   ??? Temp 98 ??F (36.7 ??C)   ??? Resp (!) 110   ??? Wt 80 lb (36.3 kg)   ??? SpO2 100%   ??? BMI 15.71 kg/m2       Physical Exam:  General: Well developed, well nourished, 82 y.o. female in  NAD   Vitals: Blood pressure 157/64, pulse (!) 110, temperature 98 ??F (36.7 ??C), resp. rate (!) 110, weight 80 lb (36.3 kg), SpO2 100 %.   Skin: No rashs, ulcers, icterus, or other obvious lesions/ lacerations  Eyes: EOM intact, PERRLA   ENM: Without obvious lesions. Nares patent. Moist mucous membranes.   Neck: Supple, FROM   Lungs: CTAB   Heart: RRR, normal S1, S2. No murmurs, rubs, or gallops  Abdomen: Soft, NT, bowel sounds present all 4 quadrants   Musculoskeletal: pt rolled onto right side, unable to exam due to pain. Motor and sensory intact, 2+ distal pulses  Neuro: AAOx3. Without obvious deficits     Labs Reviewed:  BMP:   Lab Results   Component Value Date/Time    NA 138 04/03/2016 01:05 AM    K 3.0 (L) 04/03/2016 01:05 AM    CL 102 04/03/2016 01:05 AM    CO2 32 04/03/2016 01:05 AM    AGAP 4 04/03/2016 01:05 AM    GLU 55 (L) 04/03/2016 01:05 AM    BUN 11 04/03/2016 01:05 AM    CREA 0.73 04/03/2016 01:05 AM    GFRAA >60 04/03/2016 01:05 AM    GFRNA >60 04/03/2016 01:05 AM     CBC:   Lab Results   Component Value Date/Time    WBC 9.0 04/03/2016 01:05  AM    HGB 8.8 (L) 04/03/2016 01:05 AM    HCT 27.8 (L) 04/03/2016 01:05 AM    PLT 286 04/03/2016 01:05 AM       Assessment/Plan   @DIAGMED @  Active Problems:    * No active hospital problems. *      Pt. Stable  Pt. NPO x meds   Consent Pt. For right hip hemi arthroplasty   I discussed the risks and benefits and potential adverse outcomes of both operative vs non operative treatment of right hip fracture with the patient.      Risks of operative intervention include but not limited to bleeding, infection, deep vein thrombosis, pulmonary embolism, death, limb length discrepancy, reflexive sympathetic dystrophy, fat embolism syndrome,damage to blood vessels and nerves, malunion, non-union, delayed union, failure of hardware, post traumatic arthritis, stroke, heart attack, and death.      Patient understands that infection may arise and may require numerous surgeries.    Medical consultation for Pre-/post-operative medical care.  Pre-Op labs Ordered    Daniel NonesBrandee L Cashis Rill, PA-C   [facility]   [phone]

## 2016-04-03 LAB — CBC WITH AUTOMATED DIFF
ABS. BASOPHILS: 0 10*3/uL (ref 0.0–0.06)
ABS. BASOPHILS: 0.1 10*3/uL — ABNORMAL HIGH (ref 0.0–0.06)
ABS. EOSINOPHILS: 0.1 10*3/uL (ref 0.0–0.4)
ABS. EOSINOPHILS: 0 10*3/uL (ref 0.0–0.4)
ABS. LYMPHOCYTES: 1.3 10*3/uL (ref 0.9–3.6)
ABS. LYMPHOCYTES: 0.9 10*3/uL (ref 0.8–3.5)
ABS. MONOCYTES: 0.9 10*3/uL (ref 0.05–1.2)
ABS. MONOCYTES: 0.8 10*3/uL (ref 0–1.0)
ABS. NEUTROPHILS: 6.1 10*3/uL (ref 1.8–8.0)
ABS. NEUTROPHILS: 7.2 10*3/uL (ref 1.8–8.0)
BASOPHILS: 0 % (ref 0–2)
BASOPHILS: 1 % (ref 0–3)
EOSINOPHILS: 1 % (ref 0–5)
EOSINOPHILS: 0 % (ref 0–5)
HCT: 26.9 % — ABNORMAL LOW (ref 35.0–45.0)
HCT: 27.8 % — ABNORMAL LOW (ref 35.0–45.0)
HGB: 8.6 g/dL — ABNORMAL LOW (ref 12.0–16.0)
HGB: 8.8 g/dL — ABNORMAL LOW (ref 12.0–16.0)
LYMPHOCYTES: 16 % — ABNORMAL LOW (ref 21–52)
LYMPHOCYTES: 10 % — ABNORMAL LOW (ref 20–51)
MCH: 23.9 PG — ABNORMAL LOW (ref 24.0–34.0)
MCH: 23.9 PG — ABNORMAL LOW (ref 24.0–34.0)
MCHC: 32 g/dL (ref 31.0–37.0)
MCHC: 31.7 g/dL (ref 31.0–37.0)
MCV: 74.7 FL (ref 74.0–97.0)
MCV: 75.5 FL (ref 74.0–97.0)
MONOCYTES: 11 % — ABNORMAL HIGH (ref 3–10)
MONOCYTES: 9 % (ref 2–9)
MPV: 9.6 FL (ref 9.2–11.8)
MPV: 9.7 FL (ref 9.2–11.8)
NEUTROPHILS: 72 % (ref 40–73)
NEUTROPHILS: 80 % — ABNORMAL HIGH (ref 42–75)
PLATELET COMMENTS: ADEQUATE
PLATELET: 275 10*3/uL (ref 135–420)
PLATELET: 286 10*3/uL (ref 135–420)
RBC: 3.6 M/uL — ABNORMAL LOW (ref 4.20–5.30)
RBC: 3.68 M/uL — ABNORMAL LOW (ref 4.20–5.30)
RDW: 15.1 % — ABNORMAL HIGH (ref 11.6–14.5)
RDW: 15.2 % — ABNORMAL HIGH (ref 11.6–14.5)
WBC: 8.4 10*3/uL (ref 4.6–13.2)
WBC: 9 10*3/uL (ref 4.6–13.2)

## 2016-04-03 LAB — PROTHROMBIN TIME + INR
INR: 1 (ref 0.8–1.2)
Prothrombin time: 12.6 s (ref 11.5–15.2)

## 2016-04-03 LAB — METABOLIC PANEL, BASIC
Anion gap: 6 mmol/L (ref 3.0–18)
Anion gap: 4 mmol/L (ref 3.0–18)
BUN/Creatinine ratio: 18 (ref 12–20)
BUN/Creatinine ratio: 15 (ref 12–20)
BUN: 12 MG/DL (ref 7.0–18)
BUN: 11 MG/DL (ref 7.0–18)
CO2: 32 mmol/L (ref 21–32)
CO2: 32 mmol/L (ref 21–32)
Calcium: 8.8 MG/DL (ref 8.5–10.1)
Calcium: 8.9 MG/DL (ref 8.5–10.1)
Chloride: 99 mmol/L — ABNORMAL LOW (ref 100–108)
Chloride: 102 mmol/L (ref 100–108)
Creatinine: 0.68 MG/DL (ref 0.6–1.3)
Creatinine: 0.73 MG/DL (ref 0.6–1.3)
GFR est AA: >60 mL/min/{1.73_m2} (ref >60)
GFR est AA: >60 mL/min/{1.73_m2} (ref >60)
GFR est non-AA: >60 mL/min/{1.73_m2} (ref >60)
GFR est non-AA: >60 mL/min/{1.73_m2} (ref >60)
Glucose: 179 mg/dL — ABNORMAL HIGH (ref 74–99)
Glucose: 55 mg/dL — ABNORMAL LOW (ref 74–99)
Potassium: 3.5 mmol/L (ref 3.5–5.5)
Potassium: 3 mmol/L — ABNORMAL LOW (ref 3.5–5.5)
Sodium: 137 mmol/L (ref 136–145)
Sodium: 138 mmol/L (ref 136–145)

## 2016-04-03 LAB — HEMOGLOBIN A1C WITH EAG
Est. average glucose: 126 mg/dL
Hemoglobin A1c: 6 % — ABNORMAL HIGH (ref 4.2–5.6)

## 2016-04-03 LAB — CARDIAC PANEL,(CK, CKMB & TROPONIN)
CK - MB: 1.4 ng/ml (ref <3.6)
CK-MB Index: 2.3 % (ref 0.0–4.0)
CK: 60 U/L (ref 26–192)
Troponin-I, QT: 0.04 NG/ML (ref 0.0–0.045)

## 2016-04-03 LAB — GLUCOSE, POC
Glucose (POC): 153 mg/dL — ABNORMAL HIGH (ref 70–110)
Glucose (POC): 116 mg/dL — ABNORMAL HIGH (ref 70–110)
Glucose (POC): 143 mg/dL — ABNORMAL HIGH (ref 70–110)
Glucose (POC): 153 mg/dL — ABNORMAL HIGH (ref 70–110)

## 2016-04-03 LAB — URINE MICROSCOPIC ONLY
Bacteria: NEGATIVE /hpf
Epithelial cells: NEGATIVE /lpf (ref 0–5)
RBC: NEGATIVE /hpf (ref 0–5)
WBC: 0 /hpf (ref 0–4)

## 2016-04-03 LAB — URINALYSIS W/ RFLX MICROSCOPIC
Bilirubin: NEGATIVE
Blood: NEGATIVE
Glucose: >1000 mg/dL — AB
Ketone: NEGATIVE mg/dL
Leukocyte Esterase: NEGATIVE
Nitrites: NEGATIVE
Protein: 30 mg/dL — AB
Specific gravity: 1.018 (ref 1.005–1.030)
Urobilinogen: 0.2 EU/dL (ref 0.2–1.0)
pH (UA): 6.5 (ref 5.0–8.0)

## 2016-04-03 LAB — TROPONIN I: Troponin-I, QT: 0.05 NG/ML — ABNORMAL HIGH (ref 0.0–0.045)

## 2016-04-03 MED ORDER — PROPOFOL 10 MG/ML IV EMUL
10 mg/mL | INTRAVENOUS | Status: DC | PRN
Start: 2016-04-03 — End: 2016-04-03
  Administered 2016-04-03: 21:00:00 via INTRAVENOUS

## 2016-04-03 MED ORDER — DEXTROSE 50% IN WATER (D50W) IV SYRG
INTRAVENOUS | Status: DC | PRN
Start: 2016-04-03 — End: 2016-04-04

## 2016-04-03 MED ORDER — TRAMADOL 50 MG TAB
50 mg | Freq: Four times a day (QID) | ORAL | Status: DC | PRN
Start: 2016-04-03 — End: 2016-04-03

## 2016-04-03 MED ORDER — HYDROMORPHONE (PF) 2 MG/ML IJ SOLN
2 mg/mL | INTRAMUSCULAR | Status: DC | PRN
Start: 2016-04-03 — End: 2016-04-03
  Administered 2016-04-04: via INTRAVENOUS

## 2016-04-03 MED ORDER — PHENYLEPHRINE (PF) 1 MG/10 ML (100 MCG/ML) IN NS IV SYRINGE
1 mg/0 mL (00 mcg/mL) | INTRAVENOUS | Status: DC | PRN
Start: 2016-04-03 — End: 2016-04-03
  Administered 2016-04-03: 21:00:00 via INTRAVENOUS

## 2016-04-03 MED ORDER — NALOXONE 0.4 MG/ML INJECTION
0.4 mg/mL | INTRAMUSCULAR | Status: DC | PRN
Start: 2016-04-03 — End: 2016-04-03

## 2016-04-03 MED ORDER — D5-1/2 NS & POTASSIUM CHLORIDE 20 MEQ/L IV
20 mEq/L | INTRAVENOUS | Status: DC
Start: 2016-04-03 — End: 2016-04-04
  Administered 2016-04-03: 05:00:00 via INTRAVENOUS

## 2016-04-03 MED ORDER — SUCCINYLCHOLINE CHLORIDE 20 MG/ML INJECTION
20 mg/mL | INTRAMUSCULAR | Status: DC | PRN
Start: 2016-04-03 — End: 2016-04-03
  Administered 2016-04-03: 21:00:00 via INTRAVENOUS

## 2016-04-03 MED ORDER — RANITIDINE 150 MG TAB
150 mg | Freq: Every evening | ORAL | Status: DC
Start: 2016-04-03 — End: 2016-04-02

## 2016-04-03 MED ORDER — DEXTROSE 50% IN WATER (D50W) IV SYRG
INTRAVENOUS | Status: DC | PRN
Start: 2016-04-03 — End: 2016-04-03

## 2016-04-03 MED ORDER — CEFAZOLIN 1 GRAM SOLUTION FOR INJECTION
1 gram | INTRAMUSCULAR | Status: AC
Start: 2016-04-03 — End: ?

## 2016-04-03 MED ORDER — FENTANYL CITRATE (PF) 50 MCG/ML IJ SOLN
50 mcg/mL | INTRAMUSCULAR | Status: AC
Start: 2016-04-03 — End: ?

## 2016-04-03 MED ORDER — DOCUSATE SODIUM 100 MG CAP
100 mg | Freq: Every day | ORAL | Status: DC
Start: 2016-04-03 — End: 2016-04-04
  Administered 2016-04-04: 14:00:00 via ORAL

## 2016-04-03 MED ORDER — FAMOTIDINE 20 MG TAB
20 mg | Freq: Every evening | ORAL | Status: DC
Start: 2016-04-03 — End: 2016-04-02

## 2016-04-03 MED ORDER — SODIUM CHLORIDE 0.9 % IJ SYRG
INTRAMUSCULAR | Status: DC | PRN
Start: 2016-04-03 — End: 2016-04-03

## 2016-04-03 MED ORDER — GLUCOSE 4 GRAM CHEWABLE TAB
4 gram | ORAL | Status: DC | PRN
Start: 2016-04-03 — End: 2016-04-03

## 2016-04-03 MED ORDER — SUCCINYLCHOLINE CHLORIDE 20 MG/ML INJECTION
20 mg/mL | INTRAMUSCULAR | Status: AC
Start: 2016-04-03 — End: ?

## 2016-04-03 MED ORDER — ALBUTEROL SULFATE 0.083 % (0.83 MG/ML) SOLN FOR INHALATION
2.5 mg /3 mL (0.083 %) | RESPIRATORY_TRACT | Status: DC | PRN
Start: 2016-04-03 — End: 2016-04-03

## 2016-04-03 MED ORDER — LACTATED RINGERS IV
INTRAVENOUS | Status: DC
Start: 2016-04-03 — End: 2016-04-03
  Administered 2016-04-04: via INTRAVENOUS

## 2016-04-03 MED ORDER — ONDANSETRON (PF) 4 MG/2 ML INJECTION
4 mg/2 mL | INTRAMUSCULAR | Status: AC
Start: 2016-04-03 — End: ?

## 2016-04-03 MED ORDER — LIDOCAINE (PF) 20 MG/ML (2 %) IJ SOLN
20 mg/mL (2 %) | INTRAMUSCULAR | Status: AC
Start: 2016-04-03 — End: ?

## 2016-04-03 MED ORDER — FERROUS SULFATE 325 MG (65 MG ELEMENTAL IRON) TAB
325 mg (65 mg iron) | Freq: Three times a day (TID) | ORAL | Status: DC
Start: 2016-04-03 — End: 2016-04-04
  Administered 2016-04-04 (×2): via ORAL

## 2016-04-03 MED ORDER — HYDRALAZINE 20 MG/ML IJ SOLN
20 mg/mL | Freq: Four times a day (QID) | INTRAMUSCULAR | Status: DC | PRN
Start: 2016-04-03 — End: 2016-04-04

## 2016-04-03 MED ORDER — GLUCAGON 1 MG INJECTION
1 mg | INTRAMUSCULAR | Status: DC | PRN
Start: 2016-04-03 — End: 2016-04-04

## 2016-04-03 MED ORDER — GLYCOPYRROLATE 0.2 MG/ML IJ SOLN
0.2 mg/mL | INTRAMUSCULAR | Status: DC | PRN
Start: 2016-04-03 — End: 2016-04-03
  Administered 2016-04-03: 21:00:00 via INTRAVENOUS

## 2016-04-03 MED ORDER — SODIUM CHLORIDE 0.9 % IV PIGGY BACK
1 gram | INTRAVENOUS | Status: DC | PRN
Start: 2016-04-03 — End: 2016-04-03
  Administered 2016-04-03: 21:00:00 via INTRAVENOUS

## 2016-04-03 MED ORDER — ONDANSETRON (PF) 4 MG/2 ML INJECTION
4 mg/2 mL | INTRAMUSCULAR | Status: DC | PRN
Start: 2016-04-03 — End: 2016-04-03
  Administered 2016-04-03: 21:00:00 via INTRAVENOUS

## 2016-04-03 MED ORDER — AMLODIPINE 10 MG TAB
10 mg | Freq: Every day | ORAL | Status: DC
Start: 2016-04-03 — End: 2016-04-02

## 2016-04-03 MED ORDER — DIPHENHYDRAMINE HCL 50 MG/ML IJ SOLN
50 mg/mL | INTRAMUSCULAR | Status: DC | PRN
Start: 2016-04-03 — End: 2016-04-03

## 2016-04-03 MED ORDER — HYDRALAZINE 10 MG TAB
10 mg | Freq: Two times a day (BID) | ORAL | Status: DC
Start: 2016-04-03 — End: 2016-04-02

## 2016-04-03 MED ORDER — DESFLURANE FOR INHALATION
100 % | RESPIRATORY_TRACT | Status: AC
Start: 2016-04-03 — End: ?

## 2016-04-03 MED ORDER — PHENYLEPHRINE (PF) 1 MG/10 ML (100 MCG/ML) IN NS IV SYRINGE
1 mg/0 mL (00 mcg/mL) | INTRAVENOUS | Status: AC
Start: 2016-04-03 — End: ?

## 2016-04-03 MED ORDER — INSULIN LISPRO 100 UNIT/ML INJECTION
100 unit/mL | Freq: Once | SUBCUTANEOUS | Status: DC
Start: 2016-04-03 — End: 2016-04-03

## 2016-04-03 MED ORDER — CEFAZOLIN 2 GM/50 ML IN DEXTROSE (ISO-OSMOTIC) IVPB
2 gram/50 mL | Freq: Once | INTRAVENOUS | Status: AC
Start: 2016-04-03 — End: 2016-04-02
  Administered 2016-04-03: 04:00:00 via INTRAVENOUS

## 2016-04-03 MED ORDER — GLYCOPYRROLATE 0.2 MG/ML IJ SOLN
0.2 mg/mL | INTRAMUSCULAR | Status: AC
Start: 2016-04-03 — End: ?

## 2016-04-03 MED ORDER — FAMOTIDINE 20 MG TAB
20 mg | Freq: Every evening | ORAL | Status: DC
Start: 2016-04-03 — End: 2016-04-04
  Administered 2016-04-03 – 2016-04-04 (×2): via ORAL

## 2016-04-03 MED ORDER — DEXAMETHASONE SODIUM PHOSPHATE 4 MG/ML IJ SOLN
4 mg/mL | INTRAMUSCULAR | Status: DC | PRN
Start: 2016-04-03 — End: 2016-04-03
  Administered 2016-04-03: 21:00:00 via INTRAVENOUS

## 2016-04-03 MED ORDER — LIDOCAINE (PF) 20 MG/ML (2 %) IJ SOLN
20 mg/mL (2 %) | INTRAMUSCULAR | Status: DC | PRN
Start: 2016-04-03 — End: 2016-04-03
  Administered 2016-04-03: 21:00:00 via INTRAVENOUS

## 2016-04-03 MED ORDER — POTASSIUM CHLORIDE 2 MEQ/ML IV SOLN
2 mEq/mL | INTRAVENOUS | Status: DC
Start: 2016-04-03 — End: 2016-04-03

## 2016-04-03 MED ORDER — INSULIN LISPRO 100 UNIT/ML INJECTION
100 unit/mL | Freq: Four times a day (QID) | SUBCUTANEOUS | Status: DC
Start: 2016-04-03 — End: 2016-04-04
  Administered 2016-04-03 – 2016-04-04 (×4): via SUBCUTANEOUS

## 2016-04-03 MED ORDER — LIDOCAINE 4 % TOPICAL SOLN
4 % | LARYNGEAL | Status: AC
Start: 2016-04-03 — End: ?

## 2016-04-03 MED ORDER — PROPOFOL 10 MG/ML IV EMUL
10 mg/mL | INTRAVENOUS | Status: AC
Start: 2016-04-03 — End: ?

## 2016-04-03 MED ORDER — MORPHINE 2 MG/ML INJECTION
2 mg/mL | INTRAMUSCULAR | Status: DC | PRN
Start: 2016-04-03 — End: 2016-04-03

## 2016-04-03 MED ORDER — FENTANYL CITRATE (PF) 50 MCG/ML IJ SOLN
50 mcg/mL | INTRAMUSCULAR | Status: DC | PRN
Start: 2016-04-03 — End: 2016-04-03
  Administered 2016-04-03 (×5): via INTRAVENOUS

## 2016-04-03 MED ORDER — GLUCOSE 4 GRAM CHEWABLE TAB
4 gram | ORAL | Status: DC | PRN
Start: 2016-04-03 — End: 2016-04-04

## 2016-04-03 MED ORDER — GLUCAGON 1 MG INJECTION
1 mg | INTRAMUSCULAR | Status: DC | PRN
Start: 2016-04-03 — End: 2016-04-03

## 2016-04-03 MED ORDER — LABETALOL 5 MG/ML IV SOLN
5 mg/mL | INTRAVENOUS | Status: DC | PRN
Start: 2016-04-03 — End: 2016-04-03
  Administered 2016-04-03 (×4): via INTRAVENOUS

## 2016-04-03 MED ORDER — LACTATED RINGERS IV
INTRAVENOUS | Status: DC | PRN
Start: 2016-04-03 — End: 2016-04-03
  Administered 2016-04-03: 21:00:00 via INTRAVENOUS

## 2016-04-03 MED ORDER — LIDOCAINE (PF) 10 MG/ML (1 %) IJ SOLN
101 mg/mL (1 %) | INTRAMUSCULAR | Status: AC
Start: 2016-04-03 — End: 2016-04-03
  Administered 2016-04-03 (×4): via INTRAVENOUS

## 2016-04-03 MED FILL — CEFAZOLIN 1 GRAM SOLUTION FOR INJECTION: 1 gram | INTRAMUSCULAR | Qty: 1000

## 2016-04-03 MED FILL — POTASSIUM CHLORIDE 2 MEQ/ML IV SOLN: 2 mEq/mL | INTRAVENOUS | Qty: 5

## 2016-04-03 MED FILL — GLYCOPYRROLATE 0.2 MG/ML IJ SOLN: 0.2 mg/mL | INTRAMUSCULAR | Qty: 2

## 2016-04-03 MED FILL — PHENYLEPHRINE (PF) 1 MG/10 ML (100 MCG/ML) IN NS IV SYRINGE: 1 mg/0 mL (00 mcg/mL) | INTRAVENOUS | Qty: 10

## 2016-04-03 MED FILL — LIDOCAINE 4 % TOPICAL SOLN: 4 % | LARYNGEAL | Qty: 4

## 2016-04-03 MED FILL — BD POSIFLUSH NORMAL SALINE 0.9 % INJECTION SYRINGE: INTRAMUSCULAR | Qty: 10

## 2016-04-03 MED FILL — FENTANYL CITRATE (PF) 50 MCG/ML IJ SOLN: 50 mcg/mL | INTRAMUSCULAR | Qty: 2

## 2016-04-03 MED FILL — D5-1/2 NS & POTASSIUM CHLORIDE 20 MEQ/L IV: 20 mEq/L | INTRAVENOUS | Qty: 1000

## 2016-04-03 MED FILL — LIDOCAINE (PF) 20 MG/ML (2 %) IJ SOLN: 20 mg/mL (2 %) | INTRAMUSCULAR | Qty: 5

## 2016-04-03 MED FILL — LACTATED RINGERS IV: INTRAVENOUS | Qty: 1000

## 2016-04-03 MED FILL — ONDANSETRON (PF) 4 MG/2 ML INJECTION: 4 mg/2 mL | INTRAMUSCULAR | Qty: 2

## 2016-04-03 MED FILL — INSULIN LISPRO 100 UNIT/ML INJECTION: 100 unit/mL | SUBCUTANEOUS | Qty: 1

## 2016-04-03 MED FILL — PROPOFOL 10 MG/ML IV EMUL: 10 mg/mL | INTRAVENOUS | Qty: 20

## 2016-04-03 MED FILL — ANECTINE 20 MG/ML INJECTION SOLUTION: 20 mg/mL | INTRAMUSCULAR | Qty: 20

## 2016-04-03 MED FILL — FAMOTIDINE 20 MG TAB: 20 mg | ORAL | Qty: 2

## 2016-04-03 MED FILL — CEFAZOLIN 2 GM/50 ML IN DEXTROSE (ISO-OSMOTIC) IVPB: 2 gram/50 mL | INTRAVENOUS | Qty: 50

## 2016-04-03 MED FILL — SUPRANE 100 % INHALATION LIQUID: 100 % | RESPIRATORY_TRACT | Qty: 1

## 2016-04-03 NOTE — Other (Deleted)
Please clarify if this patient is being treated/managed for:    =>  hypokalemia in setting of right hip fracture  with orders for IV KCL x 2 runs    =>Other Explanation of clinical findings  =>Unable to Determine (no explanation of clinical findings)    The medical record reflects the following:    Risk:  advanced age;   NH resident     Clinical Indicators: K+ level 3.5  on admit  11/28.  now down to 3.0 today 11/29    Treatment:   IV KCL  10 meq in 100 cc's   x 2 runs     Thank you,    Jolene ProvostKathy Humphrey   RN   CCDS    x [phone]

## 2016-04-03 NOTE — Progress Notes (Signed)
Care Management Interventions  PCP Verified by CM: Yes ([place])  Palliative Care Criteria Met (RRAT>21 & CHF Dx)?: No  Mode of Transport at Discharge: BLS  Transition of Care Consult (CM Consult): Discharge Planning, SNF  Occupational Therapy Consult: Yes  Current Support Network: Family Lives [place], [place]  Confirm Follow Up Transport: Other (see comment)  Plan discussed with Pt/Family/Caregiver: Yes (pt will return to [facility])  Discharge Location  Discharge Placement: [facility])

## 2016-04-03 NOTE — Other (Signed)
Arrived of floor from PACU in stable condition at this time.

## 2016-04-03 NOTE — Progress Notes (Signed)
Date of Surgery Update:  [name] LittlerKatherine Marsee was seen and examined.  History and physical has been reviewed. The patient has been examined. There have been no significant clinical changes since the completion of the originally dated History and Physical.    Signed By: Bettina GaviaErnesto Luciano-Perez, MD     April 03, 2016 3:23 PM

## 2016-04-03 NOTE — Brief Op Note (Signed)
BRIEF OPERATIVE NOTE    Date of Procedure: 04/03/2016   Preoperative Diagnosis: Displaced right femoral neck fracture.  Postoperative Diagnosis: Displaced right femoral neck fracture.    Procedure(s):  RIGHT HIP Cemented Bipolar HEMIARTHROPLASTY/ DEPUY  Surgeon(s) and Role:     * Bettina GaviaErnesto Luciano-Perez, MD - Primary         Assistant Staff:       Surgical Staff:  Circ-1: Charlotta NewtonAmelita D Tutica, RN  Scrub Tech-1: Laverna Peaceerry F Walton  Surg Asst-1: Alesia Morinlarence Phaup  Event Time In   Incision Start 1648   Incision Close      Anesthesia: General   Estimated Blood Loss: <20 ml  Specimens: * No specimens in log *   Complications: none pt stable going to recovery  Implants:   Implant Name Type Inv. Item Serial No. Manufacturer Lot No. LRB No. Used Action   PREP KT FEM ENHANCE TTL HIP --  - [id number]  PREP KT FEM ENHANCE TTL HIP --   [employer] [id number] Right 1 Implanted   CNTRLZR FEM STEM DSTL 8.5MM --  - [id number]  CNTRLZR FEM STEM DSTL 8.5MM --   [employer] [id number] Right 1 Implanted   STEM FEM CEM 12/14 TAPR 2 -- [employer] - [id number]  STEM FEM CEM 12/14 TAPR 2 -- [employer]  [employer] [id number] Right 1 Implanted   CEMENT BNE ENDUR 40GM --  - [id number]  CEMENT BNE ENDUR 40GM --   [employer] [id number] Right 2 Implanted   HEAD FEM EZ 28MM +1.5MM NK --  - [id number]  HEAD FEM EZ 28MM +1.5MM NK --   [employer] [id number] Right 1 Implanted   HEAD FEM SLF-CENTER 40X28 RUST --  - [id number]   HEAD FEM SLF-CENTER 40X28 RUST --    [employer] [id number] Right 1 Implanted

## 2016-04-03 NOTE — Other (Signed)
Verbal face to face update given to daughter in waiting room. Patient meets criteria to transfer to room. Change of shift in process on unit. Daughter in to see as she needs to leave.

## 2016-04-03 NOTE — Anesthesia Post-Procedure Evaluation (Signed)
Post-Anesthesia Evaluation and Assessment    [name] MRN: [id number]  SSN: [id number]    Date of Birth: 09/02/1921  Age: 82 y.o.  Sex: female       Cardiovascular Function/Vital Signs  Visit Vitals   ??? BP 186/75   ??? Pulse (!) 116   ??? Temp 36.4 ??C (97.5 ??F)   ??? Resp 18   ??? Wt 36.3 kg (80 lb)   ??? SpO2 99%   ??? BMI 15.71 kg/m2       Patient is status post general anesthesia for Procedure(s):  RIGHT HIP HEMIARTHROPLASTY/ DEPUY.    Nausea/Vomiting: None    Postoperative hydration reviewed and adequate.    Pain:  Pain Scale 1: Numeric (0 - 10) (04/02/16 1800)  Pain Intensity 1: 0 (04/02/16 1800)   Managed    Neurological Status:   Neuro  Orientation Level: Oriented to person;Oriented to place (04/03/16 0730)  Cognition: Follows commands (04/03/16 0730)  Speech: Clear (04/03/16 0730)  LUE Motor Response: Purposeful (04/03/16 0730)  LLE Motor Response: Purposeful (04/03/16 0730)  RUE Motor Response: Purposeful (04/03/16 0730)  RLE Motor Response: Purposeful (04/03/16 0730)   At baseline    Mental Status and Level of Consciousness: Arousable    Pulmonary Status:   O2 Device: Room air (04/03/16 0730)   Adequate oxygenation and airway patent    Complications related to anesthesia: None    Post-anesthesia assessment completed. No concerns    Signed By: Alma Friendlyheresa H Levi Crass, MD     April 03, 2016

## 2016-04-03 NOTE — Discharge Summary (Signed)
Discharge Summary  [facility]      [name] Age: 82 y.o.  Sex: female  DOB: 1921-12-29    MRN: [id number]      DOA: 04/02/2016      Discharge Date: 04/04/2016      Attending:Stephen Harlon DittyBrawley, MD      PCP: No primary care provider on file.        ================================================================    Reason for Admission:   Femoral neck fracture [facility](HCC)      Discharge Diagnoses:   Femoral neck fracture (resolved s/p hemiarthroplasty)  Dementia (chronic)  HTN (stable with medication)  DM2 (controlled with diet)  GERD (stable with medication)  Iron deficiency anemia (stable with supplementation)  Constipation (stable with medication)  Osteoarthritis (stable with medication)    Important notes to PCP/ follow-up studies and evaluations   - Pt has durable DNR and daughter, Venida JarvisMadie Juarez, is MPOA.  Pending labs and studies:    Operative Procedures:   R Hip Cemented Bipolar Hemiarthroplasty- 11/29    Discharge Medications:     Current Discharge Medication List      START taking these medications    Details   oxyCODONE-acetaminophen (PERCOCET) 5-325 mg per tablet Take 1-2 Tabs by mouth every four (4) hours as needed. Max Daily Amount: 12 Tabs.  Qty: 30 Tab, Refills: 0         CONTINUE these medications which have NOT CHANGED    Details   amLODIPine (NORVASC) 10 mg tablet Take 10 mg by mouth daily. Indications: hypertension      hydrALAZINE (APRESOLINE) 10 mg tablet Take 10 mg by mouth two (2) times a day.      docusate sodium (COLACE) 100 mg capsule Take 100 mg by mouth daily. Indications: constipation      raNITIdine (ZANTAC) 300 mg tablet Take 300 mg by mouth nightly.      ferrous sulfate (IRON) 325 mg (65 mg iron) EC tablet Take 325 mg by mouth three (3) times daily (with meals).      traMADol (ULTRAM) 50 mg tablet Take 50 mg by mouth every six (6) hours as needed for Pain.                Disposition: LTC    Consultants:    Bettina GaviaErnesto Luciano-Perez, MD- Orthopedic Surgery     Brief Hospital Course (including pertinent history and physical findings)  [name] Juarez is a 82 y.o. female with PMHx of dementia, HTN, DMII, GERD, iron deficiency anemia, constipation, and osteoarthritis, who presented with complaint of right hip pain. [facility] and [facility] at the [facility] facility on Sunday. [facility] and was noted to have obvious deformity of right hip on physical examination. Imaging showed patient has a displaced fracture of right femoral neck.    R femoral neck fracture: Orthopedic surgery consulted and pt taken to the OR for hemiarthroplasty on 11/29. Pt tolerated procedure well, pain was well controlled, and pt received Ancef for abx prophylaxis. PT/OT evaluated pt and recommended SNF.    All other chronic conditions managed inpatient on home regimens and pt discharged with home medications unchanged.    Summarized key findings and results (labs, imaging studies, ECHO, cardiac cath, endoscopies, etc):  Hgb: 8.6>8.8>9.2>9.2  Na: 135  K: 3.0>4.2  Troponin-I: 0.05>0.04  Hgb A1c: 6.0    CXR: Atherosclerosis.  XR Right Hip: Displaced right femoral neck fracture    Functional status  and cognitive function:    Ambulates with difficulty, s/p R hip hemiarthroplasty  Status: alert, cooperative, no distress, appears stated age    Diet: Regular    Code status and advanced care plan: Durable DNR sent with patient  Power Of Attorney: Venida JarvisMadie Juarez [phone]??(Relationship: Daughter)      Follow-up:   Follow-up Information     Follow up With Details Comments Contact Info    Phys Other, MD   Patient can only remember the practice name and not the physician              ================================================================  Germain OsgoodJoanne Aleeza [place], MD, PGY-1  [facility]  [facility]  04/03/16 6:11 PM

## 2016-04-03 NOTE — Anesthesia Pre-Procedure Evaluation (Signed)
Anesthetic History   No history of anesthetic complications            Review of Systems / Medical History  Patient summary reviewed and pertinent labs reviewed    Pulmonary  Within defined limits            Pertinent negatives: No smoker     Neuro/Psych   Within defined limits           Cardiovascular    Hypertension                   GI/Hepatic/Renal     GERD           Endo/Other    Diabetes    Arthritis     Other Findings   Comments:   Risk Factors for Postoperative nausea/vomiting:       History of postoperative nausea/vomiting?  NO       Female?  YES       Motion sickness?  NO       Intended opioid administration for postoperative analgesia?  NO      Smoking Abstinence  Current Smoker?   NO  Elective Surgery? NO  Seen preoperatively by anesthesiologist or proxy prior to day of surgery?  YES  Pt abstained from smoking 24 hours prior to anesthesia? N/A           Physical Exam    Airway  Mallampati: II  TM Distance: 4 - 6 cm  Neck ROM: normal range of motion   Mouth opening: Normal     Cardiovascular  Regular rate and rhythm,  S1 and S2 normal,  no murmur, click, rub, or gallop             Dental    Dentition: Edentulous     Pulmonary  Breath sounds clear to auscultation               Abdominal  Abdominal exam normal       Other Findings            Anesthetic Plan    ASA: 3  Anesthesia type: general          Induction: Intravenous  Anesthetic plan and risks discussed with: Patient and Son / Daughter

## 2016-04-03 NOTE — Nurse Consult (Signed)
Attempted to see patient, but patient off the floor.

## 2016-04-03 NOTE — Progress Notes (Signed)
Intern Progress Note  [facility]       [name] MRN: [id number]  CSN: [id number]    Date of Birth: [birth year]  Age: 82 y.o.  Sex: female    DOA: 04/02/2016 LOS:  LOS: 1 day                    Subjective:     Acute events: Pt resting in bed. No complaints. Pain well controlled with medication.    Review of Systems   Constitutional: Negative for chills and fever.   Respiratory: Negative for shortness of breath.    Cardiovascular: Negative for chest pain.   Gastrointestinal: Negative for abdominal pain and nausea.   Neurological: Negative for dizziness and headaches.       Objective:      Patient Vitals for the past 24 hrs:   Temp Pulse Resp BP SpO2   04/03/16 0509 97.6 ??F (36.4 ??C) 97 18 174/75 100 %   04/02/16 2344 97.3 ??F (36.3 ??C) (!) 115 18 170/70 100 %   04/02/16 2115 - - - - 99 %   04/02/16 2100 - - - - 100 %   04/02/16 2000 - - - - 100 %   04/02/16 1945 - - - - 100 %   04/02/16 1930 - - - - 100 %   04/02/16 1915 - - - - 99 %   04/02/16 1800 98 ??F (36.7 ??C) (!) 110 (!) 110 157/64 100 %       No intake or output data in the 24 hours ending 04/03/16 0808    Physical Exam   Constitutional: No distress.   HENT:   Head: Normocephalic and atraumatic.   Eyes: EOM are normal.   Cardiovascular: Normal rate and regular rhythm.    Murmur heard.  Pulmonary/Chest: Effort normal and breath sounds normal.   Abdominal: Soft. Bowel sounds are normal. She exhibits no distension. There is no tenderness.   Musculoskeletal: She exhibits deformity.   Neurological: She is alert.   Skin: Skin is warm and dry.   Psychiatric: She has a normal mood and affect. Her behavior is normal.       Lab/Data Reviewed:  Recent Results (from the past 24 hour(s))   CBC WITH AUTOMATED DIFF    Collection Time: 04/02/16  9:33 PM   Result Value Ref Range    WBC 8.4 4.6 - 13.2 K/uL    RBC 3.60 (L) 4.20 - 5.30 M/uL    HGB 8.6 (L) 12.0 - 16.0 g/dL    HCT 95.6 (L) 21.3 - 45.0 %    MCV 74.7 74.0 - 97.0 FL     MCH 23.9 (L) 24.0 - 34.0 PG    MCHC 32.0 31.0 - 37.0 g/dL    RDW 08.6 (H) 57.8 - 14.5 %    PLATELET 275 135 - 420 K/uL    MPV 9.6 9.2 - 11.8 FL    NEUTROPHILS 72 40 - 73 %    LYMPHOCYTES 16 (L) 21 - 52 %    MONOCYTES 11 (H) 3 - 10 %    EOSINOPHILS 1 0 - 5 %    BASOPHILS 0 0 - 2 %    ABS. NEUTROPHILS 6.1 1.8 - 8.0 K/UL    ABS. LYMPHOCYTES 1.3 0.9 - 3.6 K/UL    ABS. MONOCYTES 0.9 0.05 - 1.2 K/UL    ABS. EOSINOPHILS 0.1 0.0 - 0.4 K/UL    ABS. BASOPHILS 0.0 0.0 - 0.06  K/UL    DF AUTOMATED     METABOLIC PANEL, BASIC    Collection Time: 04/02/16  9:33 PM   Result Value Ref Range    Sodium 137 136 - 145 mmol/L    Potassium 3.5 3.5 - 5.5 mmol/L    Chloride 99 (L) 100 - 108 mmol/L    CO2 32 21 - 32 mmol/L    Anion gap 6 3.0 - 18 mmol/L    Glucose 179 (H) 74 - 99 mg/dL    BUN 12 7.0 - 18 MG/DL    Creatinine 1.610.68 0.6 - 1.3 MG/DL    BUN/Creatinine ratio 18 12 - 20      GFR est AA >60 >60 ml/min/1.3573m2    GFR est non-AA >60 >60 ml/min/1.4973m2    Calcium 8.8 8.5 - 10.1 MG/DL   TROPONIN I    Collection Time: 04/02/16  9:33 PM   Result Value Ref Range    Troponin-I, Qt. 0.05 (H) 0.0 - 0.045 NG/ML   TYPE & SCREEN    Collection Time: 04/02/16  9:33 PM   Result Value Ref Range    Crossmatch Expiration 04/05/2016     ABO/Rh(D) A POSITIVE     Antibody screen NEG    PROTHROMBIN TIME + INR    Collection Time: 04/02/16  9:33 PM   Result Value Ref Range    Prothrombin time 12.6 11.5 - 15.2 sec    INR 1.0 0.8 - 1.2     URINALYSIS W/ RFLX MICROSCOPIC    Collection Time: 04/02/16  9:45 PM   Result Value Ref Range    Color YELLOW      Appearance CLEAR      Specific gravity 1.018 1.005 - 1.030      pH (UA) 6.5 5.0 - 8.0      Protein 30 (A) NEG mg/dL    Glucose >0960>1000 (A) NEG mg/dL    Ketone NEGATIVE  NEG mg/dL    Bilirubin NEGATIVE  NEG      Blood NEGATIVE  NEG      Urobilinogen 0.2 0.2 - 1.0 EU/dL    Nitrites NEGATIVE  NEG      Leukocyte Esterase NEGATIVE  NEG     URINE MICROSCOPIC ONLY    Collection Time: 04/02/16  9:45 PM    Result Value Ref Range    WBC 0 to 1 0 - 4 /hpf    RBC NEGATIVE  0 - 5 /hpf    Epithelial cells NEGATIVE  0 - 5 /lpf    Bacteria NEGATIVE  NEG /hpf   GLUCOSE, POC    Collection Time: 04/02/16 10:39 PM   Result Value Ref Range    Glucose (POC) 153 (H) 70 - 110 mg/dL   METABOLIC PANEL, BASIC    Collection Time: 04/03/16  1:05 AM   Result Value Ref Range    Sodium 138 136 - 145 mmol/L    Potassium 3.0 (L) 3.5 - 5.5 mmol/L    Chloride 102 100 - 108 mmol/L    CO2 32 21 - 32 mmol/L    Anion gap 4 3.0 - 18 mmol/L    Glucose 55 (L) 74 - 99 mg/dL    BUN 11 7.0 - 18 MG/DL    Creatinine 4.540.73 0.6 - 1.3 MG/DL    BUN/Creatinine ratio 15 12 - 20      GFR est AA >60 >60 ml/min/1.273m2    GFR est non-AA >60 >60 ml/min/1.6473m2    Calcium 8.9 8.5 -  10.1 MG/DL   CBC WITH AUTOMATED DIFF    Collection Time: 04/03/16  1:05 AM   Result Value Ref Range    WBC 9.0 4.6 - 13.2 K/uL    RBC 3.68 (L) 4.20 - 5.30 M/uL    HGB 8.8 (L) 12.0 - 16.0 g/dL    HCT 16.127.8 (L) 09.635.0 - 45.0 %    MCV 75.5 74.0 - 97.0 FL    MCH 23.9 (L) 24.0 - 34.0 PG    MCHC 31.7 31.0 - 37.0 g/dL    RDW 04.515.2 (H) 40.911.6 - 14.5 %    PLATELET 286 135 - 420 K/uL    MPV 9.7 9.2 - 11.8 FL    NEUTROPHILS 80 (H) 42 - 75 %    LYMPHOCYTES 10 (L) 20 - 51 %    MONOCYTES 9 2 - 9 %    EOSINOPHILS 0 0 - 5 %    BASOPHILS 1 0 - 3 %    ABS. NEUTROPHILS 7.2 1.8 - 8.0 K/UL    ABS. LYMPHOCYTES 0.9 0.8 - 3.5 K/UL    ABS. MONOCYTES 0.8 0 - 1.0 K/UL    ABS. EOSINOPHILS 0.0 0.0 - 0.4 K/UL    ABS. BASOPHILS 0.1 (H) 0.0 - 0.06 K/UL    DF AUTOMATED      PLATELET COMMENTS ADEQUATE PLATELETS      RBC COMMENTS ANISOCYTOSIS  1+        RBC COMMENTS MICROCYTOSIS  1+        RBC COMMENTS HYPOCHROMIA  2+        RBC COMMENTS OVALOCYTES  1+       HEMOGLOBIN A1C WITH EAG    Collection Time: 04/03/16  1:05 AM   Result Value Ref Range    Hemoglobin A1c 6.0 (H) 4.2 - 5.6 %    Est. average glucose 126 mg/dL   CARDIAC PANEL,(CK, CKMB & TROPONIN)    Collection Time: 04/03/16  1:05 AM   Result Value Ref Range     CK 60 26 - 192 U/L    CK - MB 1.4 <3.6 ng/ml    CK-MB Index 2.3 0.0 - 4.0 %    Troponin-I, Qt. 0.04 0.0 - 0.045 NG/ML   GLUCOSE, POC    Collection Time: 04/03/16  6:37 AM   Result Value Ref Range    Glucose (POC) 116 (H) 70 - 110 mg/dL       Scheduled Medications Reviewed:  Current Facility-Administered Medications   Medication Dose Route Frequency   ??? potassium chloride 10 mEq, lidocaine (PF) (XYLOCAINE) 10 mg/mL (1 %) 1 mL in 0.9% sodium chloride 100 mL IVPB   IntraVENous Q1H   ??? docusate sodium (COLACE) capsule 100 mg  100 mg Oral DAILY   ??? ferrous sulfate tablet 325 mg  325 mg Oral TID WITH MEALS   ??? insulin lispro (HUMALOG) injection   SubCUTAneous AC&HS   ??? dextrose 5% - 0.45% NaCl with KCl 20 mEq/L infusion  75 mL/hr IntraVENous CONTINUOUS   ??? famotidine (PEPCID) tablet 20 mg  20 mg Oral QHS         Imaging, microbiology, and EKG/Telemetry:      Assessment/Plan     82 y.o. female with PMHx of dementia, HTN, DMII, GERD, iron deficiency anemia, constipation, and osteoarthritis, now admitted with displaced fracture of right femoral neck.  ??  Displaced Right Femoral Neck Fracture. XR Right Hip showed Displaced right femoral neck fracture. Obvious right hip deformity on physical examination.  -  Fall precautions  - Ortho consulted, plan for R hemi arthroplasty on 11/29  - NPO for procedure, regular diet after  - Complete bedrest, consult PT/OT after surgery  - IVF: D5-1/2 NS w/KCl 20 @75ml /hr  ??  Iron def anemia: Iron studies in 2016 showed TIBC 232, fe- 25, iron sat 11%. Baseline hemoglobin 9; admission hemoglobin 8.6  - Daily CBC  - Transfuse if Hgb <7  - Continue ferrous sulfate 325 mg TID w/meals  ????  HTN:??BP range 150s/60s.  - Will hold home hydralazine 10 mg BID + amlodipine 10 mg daily  - Monitor vital signs per unit routine  - IV hydralazine 10mg  PRN for BP> 180/110  ????  GERD  - Continue pepcid 40 mg daily  ????  T2DM:??A1c 6.0. Diet controlled, not on any medications.  - SSI + Accuchecks ACHS  - Daily BMP   ??  Constipation  - Continue home docusate 100 mg daily  ??  Osteoarthritis  - Continue home tramadol 50 mg q6h prn   ??  ??  Diet: Regular Diet (NPO @midnight )  DVT Prophylaxis: SCDs  Code Status: DNR  Point of Contact: Latresha Yahr [phone] (Relationship: Daughter)  ??  Disposition and anticipated LOS: >2 midnights    Germain Osgood, MD, PGY-1  [facility]  04/03/16 8:08 AM

## 2016-04-03 NOTE — Other (Deleted)
Patient is noted to have a BMI of  15.7 .  Please clarify if this patient is:     =>Underweight  =>Cachexia  =>Failure to Thrive  =>Other explanation of clinical findings  =>Unable to determine (no explanation for clinical findings)    Presentation: 4'11" ,  80  lbs = BMI 15.7    Please clarify and document your clinical opinion in the progress notes and discharge summary, including the definitive and or presumptive diagnosis, (suspected or probable), related to the above clinical findings.  Please include clinical findings supporting your diagnosis.     Thank you,   Jolene ProvostKathy Humphrey  RN   CCDS   x [phone]

## 2016-04-03 NOTE — Other (Signed)
TRANSFER - OUT REPORT:    Verbal report given to Sherri on [name] LittlerKatherine Juarez  being transferred to room 555 for routine progression of care       Report consisted of patient???s Situation, Background, Assessment and   Recommendations(SBAR).     Information from the following report(s) SBAR, OR Summary, Intake/Output and MAR was reviewed with the receiving nurse.    Opportunity for questions and clarification was provided.      Patient transported with:   O2 @ 3 liters  Tech

## 2016-04-03 NOTE — Op Note (Signed)
[facility]  OPERATIVE REPORTS    Name:  [name] Juarez, [name]  MR#:  [id number]  DOB:  [birth year]  Account #:  [id number]  Date of Adm:  04/02/2016  Date of Surgery:  04/03/2016      PREOPERATIVE DIAGNOSIS: Displaced femoral neck fracture, right  hip.    POSTOPERATIVE DIAGNOSIS: Displaced femoral neck fracture.    PROCEDURES PERFORMED: Bipolar cemented hemiarthroplasty,  right hip.    SURGEON: Bettina GaviaErnesto Luciano-Perez, MD    ESTIMATED BLOOD LOSS: Minimal.    SPECIMENS REMOVED: Femoral head.    ANESTHESIA: General.    COMPLICATIONS: None.    FINDINGS: As above.    BRIEF HISTORY: The patient had fallen sustaining a displaced  femoral neck fracture. Consented for surgery after having discussed at  length possible risks and complications of surgery including infection,  bleeding, recurrence of pain, among other possible problems.    DESCRIPTION OF PROCEDURE: The patient was taken to the  operating room and placed under general endotracheal anesthesia by  the anesthesia staff, placed in the standard lateral decubitus position.  The right leg was prepped with DuraPrep solution and draped as a free  sterile field. A straight lateral approach was used. Subcutaneous  tissues dissected sharply to the level of the IT band, which was incised  in line with the incision. The gluteus medius was detached from the  anterior third subperiosteally and an anterior capsulotomy performed.  The fractured femoral head was removed. Was measured as 40. The  femoral neck was recut. The proximal femur was prepared with 10  degrees of anteversion. A lateralizing reamer and broaches 1 and 2  was left and a calcar reamer was then used. Reduction with a +128  bipolar head was done 40 mm. The trials were removed and cement  centralizer used as well as cement plug. Cement was pressurized and  a #2 Femoral stem was impacted, well lateralized. The femoral head  +14 taper, plus 1.5, 20 mm head was placed with a 40 mm  bipolar  component. The hip was reduced and found to be stable. The capsule,  gluteus medius, IT band were closed with #2 Vicryl, subcutaneous  tissues with 2-0 Vicryl and the skin with staples.    Sterile dressings were applied. The patient tolerated the procedure  well and was taken to the recovery room without problems.        Bettina GaviaERNESTO LUCIANO-PEREZ, MD    EL / [email or web address]  D:  04/04/2016   09:38  T:  04/04/2016   12:45  Job #:  [id number]

## 2016-04-03 NOTE — Discharge Summary (Signed)
Discharge Summary by Joan Floreshambuswamy, Kimra Kantor T, MD at 04/03/16 1810                Author: Joan Floreshambuswamy, Hobert Poplaski T, MD  Service: FAMILY MEDICINE  Author Type: Resident       Filed: 04/04/16 1242  Date of Service: 04/03/16 1810  Status: Attested           Editor: Joan Floreshambuswamy, Furman Trentman T, MD (Resident)  Cosigner: Pershing ProudBrawley, Stephen, MD at 04/04/16 1258          Attestation signed by Pershing ProudBrawley, Stephen, MD at 04/04/16 1258          I have personally seen and evaluated this patient.  I have personally reviewed the resident note.  I have personally discussed the management and plan of care  of this patient.  I agree with the note as written.                                         Discharge Summary   [facility]              [name]  Age: 82 y.o.   Sex: female   DOB: 1921/12/15           MRN: [id number]           DOA: 04/02/2016           Discharge Date: 04/04/2016           Attending:Stephen Harlon DittyBrawley, MD           PCP: No primary care provider on file.              ================================================================      Reason for Admission:    Femoral neck fracture [facility](HCC)         Discharge Diagnoses:    Femoral neck fracture (resolved s/p hemiarthroplasty)   Dementia (chronic)   HTN (stable with medication)   DM2 (controlled with diet)   GERD (stable with medication)   Iron deficiency anemia (stable with supplementation)   Constipation (stable with medication)   Osteoarthritis (stable with medication)      Important notes to PCP/ follow-up studies and evaluations    - Pt has durable DNR and daughter, Venida JarvisMadie Sigg, is MPOA.   Pending labs and studies:      Operative Procedures:    R Hip Cemented Bipolar Hemiarthroplasty- 11/29      Discharge Medications:      Current Discharge Medication List            START taking these medications        Details      oxyCODONE-acetaminophen (PERCOCET) 5-325 mg per tablet  Take 1-2 Tabs by mouth every four (4) hours as needed. Max Daily  Amount: 12 Tabs.   Qty: 30 Tab, Refills:  0                  CONTINUE these medications which have NOT CHANGED        Details      amLODIPine (NORVASC) 10 mg tablet  Take 10 mg by mouth daily. Indications: hypertension            hydrALAZINE (APRESOLINE) 10 mg tablet  Take 10 mg by mouth two (2) times a day.            docusate sodium (COLACE) 100 mg capsule  Take 100 mg by mouth daily. Indications: constipation            raNITIdine (ZANTAC) 300 mg tablet  Take 300 mg by mouth nightly.            ferrous sulfate (IRON) 325 mg (65 mg iron) EC tablet  Take 325 mg by mouth three (3) times daily (with meals).            traMADol (ULTRAM) 50 mg tablet  Take 50 mg by mouth every six (6) hours as needed for Pain.                               Disposition: LTC      Consultants:     Bettina GaviaErnesto Luciano-Perez, MD- Orthopedic Surgery      Brief Hospital Course (including pertinent history  and physical findings)   [name] LittlerKatherine Diggins is a 82 y.o. female with PMHx of dementia, HTN, DMII, GERD, iron deficiency anemia, constipation, and osteoarthritis, who presented with complaint of right hip pain. Patient is  a [facility] and [facility] at the nursing facility on Sunday. [facility] and was noted to have obvious deformity of right hip on physical examination. Imaging showed patient has a displaced fracture  of right femoral neck.      R femoral neck fracture: Orthopedic surgery consulted and pt taken to the OR for hemiarthroplasty on 11/29. Pt tolerated procedure well, pain  was well controlled, and pt received Ancef for abx prophylaxis. PT/OT evaluated pt and recommended SNF.      All other chronic conditions managed inpatient on home regimens and pt discharged with home medications unchanged.      Summarized key findings and results (labs, imaging studies, ECHO, cardiac cath, endoscopies, etc):   Hgb: 8.6>8.8>9.2>9.2   Na: 135   K: 3.0>4.2   Troponin-I: 0.05>0.04   Hgb A1c: 6.0       CXR: Atherosclerosis.   XR Right Hip: Displaced right femoral neck fracture      Functional status and cognitive function:     Ambulates with difficulty, s/p R hip hemiarthroplasty   Status: alert, cooperative, no distress, appears stated age      Diet: Regular      Code status and advanced care plan: Durable DNR sent with patient   Power Of Attorney: Venida JarvisMadie Daughtrey [phone]??(Relationship: Daughter)         Follow-up:      Follow-up Information        Follow up With  Details  Comments  Contact Info             Phys Other, MD      Patient can only remember the practice name and not the physician                      ================================================================   Germain OsgoodJoanne Amillia Biffle, MD, PGY-1   [facility]   [facility]   04/03/16 6:11 PM

## 2016-04-04 LAB — TYPE & SCREEN
ABO/Rh(D): A POS
Antibody screen: NEGATIVE

## 2016-04-04 LAB — CBC WITH AUTOMATED DIFF
ABS. BASOPHILS: 0 10*3/uL (ref 0.0–0.1)
ABS. EOSINOPHILS: 0 10*3/uL (ref 0.0–0.4)
ABS. LYMPHOCYTES: 0.5 10*3/uL — ABNORMAL LOW (ref 0.9–3.6)
ABS. MONOCYTES: 0.9 10*3/uL (ref 0.05–1.2)
ABS. NEUTROPHILS: 8.6 10*3/uL — ABNORMAL HIGH (ref 1.8–8.0)
BASOPHILS: 0 % (ref 0–2)
EOSINOPHILS: 0 % (ref 0–5)
HCT: 29.2 % — ABNORMAL LOW (ref 35.0–45.0)
HGB: 9.2 g/dL — ABNORMAL LOW (ref 12.0–16.0)
LYMPHOCYTES: 5 % — ABNORMAL LOW (ref 21–52)
MCH: 23.8 PG — ABNORMAL LOW (ref 24.0–34.0)
MCHC: 31.5 g/dL (ref 31.0–37.0)
MCV: 75.5 FL (ref 74.0–97.0)
MONOCYTES: 9 % (ref 3–10)
MPV: 10.3 FL (ref 9.2–11.8)
NEUTROPHILS: 86 % — ABNORMAL HIGH (ref 40–73)
PLATELET: 327 10*3/uL (ref 135–420)
RBC: 3.87 M/uL — ABNORMAL LOW (ref 4.20–5.30)
RDW: 15 % — ABNORMAL HIGH (ref 11.6–14.5)
WBC: 9.9 10*3/uL (ref 4.6–13.2)

## 2016-04-04 LAB — GLUCOSE, POC
Glucose (POC): 113 mg/dL — ABNORMAL HIGH (ref 70–110)
Glucose (POC): 111 mg/dL — ABNORMAL HIGH (ref 70–110)
Glucose (POC): 204 mg/dL — ABNORMAL HIGH (ref 70–110)
Glucose (POC): 199 mg/dL — ABNORMAL HIGH (ref 70–110)
Glucose (POC): 261 mg/dL — ABNORMAL HIGH (ref 70–110)

## 2016-04-04 LAB — METABOLIC PANEL, BASIC
Anion gap: 10 mmol/L (ref 3.0–18)
BUN/Creatinine ratio: 25 — ABNORMAL HIGH (ref 12–20)
BUN: 14 MG/DL (ref 7.0–18)
CO2: 23 mmol/L (ref 21–32)
Calcium: 8.5 MG/DL (ref 8.5–10.1)
Chloride: 102 mmol/L (ref 100–108)
Creatinine: 0.57 MG/DL — ABNORMAL LOW (ref 0.6–1.3)
GFR est AA: >60 mL/min/{1.73_m2} (ref >60)
GFR est non-AA: >60 mL/min/{1.73_m2} (ref >60)
Glucose: 208 mg/dL — ABNORMAL HIGH (ref 74–99)
Potassium: 4.2 mmol/L (ref 3.5–5.5)
Sodium: 135 mmol/L — ABNORMAL LOW (ref 136–145)

## 2016-04-04 LAB — TYPE AND SCREEN
ABO/Rh: A POS
Antibody Screen: NEGATIVE

## 2016-04-04 MED ORDER — OXYCODONE-ACETAMINOPHEN 5 MG-325 MG TAB
5-325 mg | ORAL | Status: DC | PRN
Start: 2016-04-04 — End: 2016-04-04
  Administered 2016-04-04: 15:00:00 via ORAL

## 2016-04-04 MED ORDER — MORPHINE 2 MG/ML INJECTION
2 mg/mL | INTRAMUSCULAR | Status: DC | PRN
Start: 2016-04-04 — End: 2016-04-04

## 2016-04-04 MED ORDER — CEFAZOLIN 1 GRAM SOLUTION FOR INJECTION
1 gram | Freq: Three times a day (TID) | INTRAMUSCULAR | Status: DC
Start: 2016-04-04 — End: 2016-04-04
  Administered 2016-04-04 (×2): via INTRAVENOUS

## 2016-04-04 MED ORDER — OXYCODONE-ACETAMINOPHEN 5 MG-325 MG TAB
5-325 mg | ORAL_TABLET | ORAL | 0 refills | Status: DC | PRN
Start: 2016-04-04 — End: 2016-09-02

## 2016-04-04 MED ORDER — NALOXONE 1 MG/ML IJ SYRG(AKA NARCAN)
1 mg/mL | Freq: Once | INTRAMUSCULAR | Status: DC | PRN
Start: 2016-04-04 — End: 2016-04-04

## 2016-04-04 MED ORDER — POLYETHYLENE GLYCOL 3350 17 GRAM (100 %) ORAL POWDER PACKET
17 gram | Freq: Every day | ORAL | Status: DC
Start: 2016-04-04 — End: 2016-04-04
  Administered 2016-04-04: 14:00:00 via ORAL

## 2016-04-04 MED ORDER — ONDANSETRON HCL 2 MG/ML IV
2 mg/mL | Freq: Four times a day (QID) | INTRAVENOUS | Status: DC | PRN
Start: 2016-04-04 — End: 2016-04-03

## 2016-04-04 MED ORDER — ONDANSETRON (PF) 4 MG/2 ML INJECTION
4 mg/2 mL | Freq: Four times a day (QID) | INTRAMUSCULAR | Status: DC | PRN
Start: 2016-04-04 — End: 2016-04-04

## 2016-04-04 MED ORDER — ENOXAPARIN 30 MG/0.3 ML SUB-Q SYRINGE
30 mg/0.3 mL | SUBCUTANEOUS | Status: DC
Start: 2016-04-04 — End: 2016-04-04
  Administered 2016-04-04: 06:00:00 via SUBCUTANEOUS

## 2016-04-04 MED ORDER — CELECOXIB 100 MG CAP
100 mg | Freq: Two times a day (BID) | ORAL | Status: DC
Start: 2016-04-04 — End: 2016-04-04
  Administered 2016-04-04: 14:00:00 via ORAL

## 2016-04-04 MED ORDER — PREGABALIN 75 MG CAP
75 mg | Freq: Two times a day (BID) | ORAL | Status: DC
Start: 2016-04-04 — End: 2016-04-04
  Administered 2016-04-04: 14:00:00 via ORAL

## 2016-04-04 MED ORDER — WATER FOR INJECTION, STERILE INJECTION
1 gram | Freq: Three times a day (TID) | INTRAMUSCULAR | Status: DC
Start: 2016-04-04 — End: 2016-04-04

## 2016-04-04 MED FILL — CEFAZOLIN 1 GRAM SOLUTION FOR INJECTION: 1 gram | INTRAMUSCULAR | Qty: 1000

## 2016-04-04 MED FILL — D5-1/2 NS & POTASSIUM CHLORIDE 20 MEQ/L IV: 20 mEq/L | INTRAVENOUS | Qty: 1000

## 2016-04-04 MED FILL — GLYCOPYRROLATE 0.2 MG/ML IJ SOLN: 0.2 mg/mL | INTRAMUSCULAR | Qty: 2

## 2016-04-04 MED FILL — QUELICIN 20 MG/ML INJECTION SOLUTION: 20 mg/mL | INTRAMUSCULAR | Qty: 10

## 2016-04-04 MED FILL — LACTATED RINGERS IV: INTRAVENOUS | Qty: 1000

## 2016-04-04 MED FILL — XYLOCAINE-MPF 20 MG/ML (2 %) INJECTION SOLUTION: 20 mg/mL (2 %) | INTRAMUSCULAR | Qty: 5

## 2016-04-04 MED FILL — NALOXONE 1 MG/ML IJ SYRG(AKA NARCAN): 1 mg/mL | INTRAMUSCULAR | Qty: 2

## 2016-04-04 MED FILL — LYRICA 75 MG CAPSULE: 75 mg | ORAL | Qty: 1

## 2016-04-04 MED FILL — DOCUSATE SODIUM 100 MG CAP: 100 mg | ORAL | Qty: 1

## 2016-04-04 MED FILL — ENOXAPARIN 30 MG/0.3 ML SUB-Q SYRINGE: 30 mg/0.3 mL | SUBCUTANEOUS | Qty: 0.3

## 2016-04-04 MED FILL — MIRALAX 17 GRAM ORAL POWDER PACKET: 17 gram | ORAL | Qty: 1

## 2016-04-04 MED FILL — OXYCODONE-ACETAMINOPHEN 5 MG-325 MG TAB: 5-325 mg | ORAL | Qty: 1

## 2016-04-04 MED FILL — PHENYLEPHRINE (PF) 1 MG/10 ML (100 MCG/ML) IN NS IV SYRINGE: 1 mg/0 mL (00 mcg/mL) | INTRAVENOUS | Qty: 10

## 2016-04-04 MED FILL — CELECOXIB 100 MG CAP: 100 mg | ORAL | Qty: 2

## 2016-04-04 MED FILL — HYDROMORPHONE (PF) 2 MG/ML IJ SOLN: 2 mg/mL | INTRAMUSCULAR | Qty: 1

## 2016-04-04 MED FILL — CEFAZOLIN 1 GRAM SOLUTION FOR INJECTION: 1 gram | INTRAMUSCULAR | Qty: 1

## 2016-04-04 MED FILL — PROPOFOL 10 MG/ML IV EMUL: 10 mg/mL | INTRAVENOUS | Qty: 20

## 2016-04-04 MED FILL — ONDANSETRON (PF) 4 MG/2 ML INJECTION: 4 mg/2 mL | INTRAMUSCULAR | Qty: 2

## 2016-04-04 MED FILL — FERROUS SULFATE 325 MG (65 MG ELEMENTAL IRON) TAB: 325 mg (65 mg iron) | ORAL | Qty: 1

## 2016-04-04 MED FILL — LABETALOL 5 MG/ML IV SYRINGE: 20 mg/4 mL (5 mg/mL) | INTRAVENOUS | Qty: 4

## 2016-04-04 MED FILL — DEXAMETHASONE SODIUM PHOSPHATE 4 MG/ML IJ SOLN: 4 mg/mL | INTRAMUSCULAR | Qty: 1

## 2016-04-04 MED FILL — FAMOTIDINE 20 MG TAB: 20 mg | ORAL | Qty: 1

## 2016-04-04 NOTE — Progress Notes (Addendum)
Intern Progress Note  [facility]       [name] MRN: [id number]  CSN: [id number]    Date of Birth: November 03, 1921  Age: 82 y.o.  Sex: female    DOA: 04/02/2016 LOS:  LOS: 2 days                    Subjective:     Acute events: Pt resting in bed. Reports pain controlled with medication. Mostly has pain when being moved. No other complaints.    Review of Systems   Constitutional: Negative for chills and fever.   Respiratory: Negative for shortness of breath.    Cardiovascular: Negative for chest pain.   Gastrointestinal: Positive for constipation. Negative for abdominal pain and nausea.       Objective:      Patient Vitals for the past 24 hrs:   Temp Pulse Resp BP SpO2   04/04/16 0046 97.1 ??F (36.2 ??C) 100 16 162/78 100 %   04/03/16 2024 97 ??F (36.1 ??C) 87 16 155/73 100 %   04/03/16 1958 97.8 ??F (36.6 ??C) 82 15 - -   04/03/16 1957 - - - 149/60 100 %   04/03/16 1947 - 82 15 154/59 100 %   04/03/16 1937 - 83 26 157/74 100 %   04/03/16 1927 - 89 16 160/69 100 %   04/03/16 1901 - 83 15 131/78 100 %   04/03/16 1857 - 80 16 93/73 100 %   04/03/16 1850 - 97 19 102/86 100 %   04/03/16 1838 - 87 17 - 100 %   04/03/16 1837 - 85 15 166/74 -   04/03/16 1827 - - - 164/73 100 %   04/03/16 1826 - 87 18 - 100 %   04/03/16 1818 97.3 ??F (36.3 ??C) 94 24 161/80 100 %   04/03/16 1817 - - - (!) 146/100 -   04/03/16 1507 97.5 ??F (36.4 ??C) (!) 116 18 186/75 99 %   04/03/16 1158 97.2 ??F (36.2 ??C) (!) 108 14 156/81 96 %   04/03/16 0836 98 ??F (36.7 ??C) 88 14 161/63 97 %         Intake/Output Summary (Last 24 hours) at 04/04/16 0759  Last data filed at 04/04/16 0121   Gross per 24 hour   Intake           2297.5 ml   Output               50 ml   Net           2247.5 ml       Physical Exam   Constitutional: No distress.   HENT:   Head: Normocephalic and atraumatic.   Cardiovascular: Normal rate and regular rhythm.    Murmur heard.  Pulmonary/Chest: Effort normal and breath sounds normal.    Abdominal: Soft. Bowel sounds are normal. She exhibits no distension. There is no tenderness.   Musculoskeletal:   Bandage on later R thigh CDI   Neurological: She is alert.   Skin: Skin is warm and dry. She is not diaphoretic.   Psychiatric: She has a normal mood and affect. Her behavior is normal.       Lab/Data Reviewed:  Recent Results (from the past 24 hour(s))   GLUCOSE, POC    Collection Time: 04/03/16 12:39 PM   Result Value Ref Range    Glucose (POC) 143 (H) 70 - 110 mg/dL   GLUCOSE, POC  Collection Time: 04/03/16  3:10 PM   Result Value Ref Range    Glucose (POC) 153 (H) 70 - 110 mg/dL   GLUCOSE, POC    Collection Time: 04/03/16  8:13 PM   Result Value Ref Range    Glucose (POC) 113 (H) 70 - 110 mg/dL   GLUCOSE, POC    Collection Time: 04/03/16  9:44 PM   Result Value Ref Range    Glucose (POC) 111 (H) 70 - 110 mg/dL   METABOLIC PANEL, BASIC    Collection Time: 04/04/16  4:34 AM   Result Value Ref Range    Sodium 135 (L) 136 - 145 mmol/L    Potassium 4.2 3.5 - 5.5 mmol/L    Chloride 102 100 - 108 mmol/L    CO2 23 21 - 32 mmol/L    Anion gap 10 3.0 - 18 mmol/L    Glucose 208 (H) 74 - 99 mg/dL    BUN 14 7.0 - 18 MG/DL    Creatinine 1.610.57 (L) 0.6 - 1.3 MG/DL    BUN/Creatinine ratio 25 (H) 12 - 20      GFR est AA >60 >60 ml/min/1.4473m2    GFR est non-AA >60 >60 ml/min/1.2973m2    Calcium 8.5 8.5 - 10.1 MG/DL   CBC WITH AUTOMATED DIFF    Collection Time: 04/04/16  4:34 AM   Result Value Ref Range    WBC 9.9 4.6 - 13.2 K/uL    RBC 3.87 (L) 4.20 - 5.30 M/uL    HGB 9.2 (L) 12.0 - 16.0 g/dL    HCT 09.629.2 (L) 04.535.0 - 45.0 %    MCV 75.5 74.0 - 97.0 FL    MCH 23.8 (L) 24.0 - 34.0 PG    MCHC 31.5 31.0 - 37.0 g/dL    RDW 40.915.0 (H) 81.111.6 - 14.5 %    PLATELET 327 135 - 420 K/uL    MPV 10.3 9.2 - 11.8 FL    NEUTROPHILS 86 (H) 40 - 73 %    LYMPHOCYTES 5 (L) 21 - 52 %    MONOCYTES 9 3 - 10 %    EOSINOPHILS 0 0 - 5 %    BASOPHILS 0 0 - 2 %    ABS. NEUTROPHILS 8.6 (H) 1.8 - 8.0 K/UL    ABS. LYMPHOCYTES 0.5 (L) 0.9 - 3.6 K/UL     ABS. MONOCYTES 0.9 0.05 - 1.2 K/UL    ABS. EOSINOPHILS 0.0 0.0 - 0.4 K/UL    ABS. BASOPHILS 0.0 0.0 - 0.1 K/UL    DF AUTOMATED     GLUCOSE, POC    Collection Time: 04/04/16  6:46 AM   Result Value Ref Range    Glucose (POC) 204 (H) 70 - 110 mg/dL       Scheduled Medications Reviewed:  Current Facility-Administered Medications   Medication Dose Route Frequency   ??? ceFAZolin (ANCEF) 1 g in 0.9% sodium chloride (MBP/ADV) 50 mL MBP  1 g IntraVENous Q8H   ??? celecoxib (CELEBREX) capsule 200 mg  200 mg Oral BID   ??? enoxaparin (LOVENOX) injection 30 mg  30 mg SubCUTAneous Q24H   ??? polyethylene glycol (MIRALAX) packet 17 g  17 g Oral DAILY   ??? pregabalin (LYRICA) capsule 75 mg  75 mg Oral BID   ??? docusate sodium (COLACE) capsule 100 mg  100 mg Oral DAILY   ??? ferrous sulfate tablet 325 mg  325 mg Oral TID WITH MEALS   ??? insulin lispro (HUMALOG) injection  SubCUTAneous AC&HS   ??? dextrose 5% - 0.45% NaCl with KCl 20 mEq/L infusion  75 mL/hr IntraVENous CONTINUOUS   ??? famotidine (PEPCID) tablet 20 mg  20 mg Oral QHS         Imaging, microbiology, and EKG/Telemetry:      Assessment/Plan     82 y.o.??female??with PMHx of dementia, HTN, DMII, GERD, iron deficiency anemia, constipation, and osteoarthritis,??now admitted with displaced fracture of right femoral neck.  ????  Displaced Right Femoral Neck Fracture. XR Right Hip showed Displaced right femoral neck fracture. Obvious right hip deformity on physical examination. POD 1 s/p right hip hemiarthroplasty   - Fall precautions  - Ortho consulted, appreciate recs  - PT/OT   - IVF: D5-1/2 NS w/KCl 20 @75ml /hr  - Morphine/percoset PRN  ????  Hypokalemia/failure to thrive: in setting of right hip fracture and poor PO intake from end stage dementia. K: 3.5>3.0>4.2. BMI: 15.1  - IV KCL x 2 runs  - Regular diet, assist with feeds    Iron def anemia: Iron studies in 2016 showed TIBC 232, fe- 25, iron sat 11%. Baseline hemoglobin 9; admission hemoglobin 8.6  - Daily CBC  - Transfuse if Hgb <7   - Continue ferrous sulfate 325 mg TID w/meals  ????  HTN:??BP range 150s/60s.  - Will hold home hydralazine 10 mg BID + amlodipine 10 mg daily  - Monitor vital signs per unit routine  - IV hydralazine 10mg  PRN for BP> 180/110  ????  GERD  - Continue pepcid 40 mg daily  ????  T2DM:??A1c 6.0. Diet controlled, not on any medications.  - SSI + Accuchecks ACHS  - Daily BMP  ????  Constipation  - Continue home docusate 100 mg daily  ????  Osteoarthritis  - Continue home tramadol 50 mg q6h prn   ????  ????  Diet: Regular Diet   DVT Prophylaxis: Lovenox  Code Status: DNR  Point of Contact: Venida JarvisMadie Nunnery [phone]??(Relationship: Daughter)  ????  Disposition and anticipated LOS: >2 midnights    Germain OsgoodJoanne Boleslaus Holloway, MD, PGY-1  [facility]  [facility]  04/04/16 7:59 AM

## 2016-04-04 NOTE — Nurse Consult (Addendum)
[facility] and patient will leaving at 4:00pm today. Patient's daughter Maximiano CossMaddie Mervin [phone] notified of transfer time.

## 2016-04-04 NOTE — Progress Notes (Signed)
Problem: Mobility Impaired (Adult and Pediatric)  Goal: *Acute Goals and Plan of Care (Insert Text)  STG's to be addressed within 3 days:  1. Bed mobility:  Supine to sit to supine S with HR for meals.  2. Activity tolerance:  Tolerate up in chair 1-2 hrs for ADL???s.  3. Transfers:  Sit to stand to chair S with LRAD for ADL's.    LTG's to be addressed within 7 days:  1. Standing/Ambulation Balance:  Increase to Good with LRAD for safe transfers and gait.  2. Ambulation:  Ambulate > 50 ft. S with LRAD for home mobility.  3. Patient Education:  Independent with HEP for home safety.  Outcome: Progressing Towards Goal  physical Therapy EVALUATION    Patient: [name] LittlerKatherine Bailon ([age] y.o. female)  Date: 04/04/2016  Primary Diagnosis: Femoral neck fracture (HCC)  DX  Procedure(s) (LRB):  RIGHT HIP HEMIARTHROPLASTY/ DEPUY (Right) 1 Day Post-Op   Precautions: Fall, PWB (RLE)    ASSESSMENT :  Based on the objective data described below, the patient presents with impaired functional mobility including bed mobility, transfers, ambulation, and general activity tolerance s/p R hip hemiarthroplasty after sustaining fall at her long term care facility. Patient presents today supine in bed with HOB elevated, hip ABD pillow between LEs, mildly confused but agreeable to PT/OT evaluation. Patient educated on PWB status of RLE, patient demonstrates difficulty understanding precautions stating "You need to tell me daughter because I'm not going to remember." Patient assisted to sitting EOB with MaxA X 2, performed sit to stand X 3 trials with ModA X 2 each trial. In standing, she requrired MinA/CGA to maintain balance with consistent verbal/tactile cues for posture. Gait training not attempted at this time d/t patient's decreased insight into WB status and ability to accurately follow commands. Patient assisted back to bed, left with ABD pillow in place and oriented to call bell.     Patient will benefit from skilled intervention to address the above impairments.  Patient???s rehabilitation potential is considered to be Fair  Factors which may influence rehabilitation potential include:            None noted           Mental ability/status           Medical condition           Home/family situation and support systems           Safety awareness           Pain tolerance/management           Other:      PLAN :  Recommendations and Planned Interventions:             Bed Mobility Training                 Neuromuscular Re-Education             Transfer Training                       Orthotic/Prosthetic Training             Gait Training                              Modalities             Therapeutic Exercises              Edema Management/Control  Therapeutic Activities                Patient and Family Training/Education             Other (comment):    Frequency/Duration: Patient will be followed by physical therapy 1-2 times per day to address goals.  Discharge Recommendations: Skilled Nursing Facility  Further Equipment Recommendations for Discharge: rolling walker     SUBJECTIVE:   Patient stated ???It's hurting me.???    OBJECTIVE DATA SUMMARY:     Past Medical History:   Diagnosis Date   ??? Arthritis    ??? Diabetes (HCC)    ??? Hypertension      Past Surgical History:   Procedure Laterality Date   ??? HX GYN      hysterectomy     Barriers to Learning/Limitations: yes;  altered mental status (i.e.Sedation, Confusion)  Compensate with: Visual Cues, Verbal Cues and Tactile Cues  Prior Level of Function/Home Situation: Patient resides at long term care facility, unable to accurately provide subjective history d/t underlying dementia.   Home Situation  Home Environment: Assisted living  Care Facility [facility]: [facility]  One/Two Story Residence: One story  Living Alone: No  Support Systems: Assisted living  Patient Expects to be Discharged to:: Unknown   Current DME Used/Available at Home: Wheelchair, Environmental consultant  Critical Behavior:  Neurologic State: Alert;Confused  Psychosocial  Patient Behaviors: Calm;Cooperative;Confused  Family  Behaviors:  (none present)  Purposeful Interaction: Yes  Pt Identified Daily Priority: Clinical issues (comment)  Caritas Process: Teaching/learning;Attend basic human needs  Caring Interventions: Reassure  Reassure: Therapeutic listening  Therapeutic Modalities: Intentional therapeutic touch  Strength:    Strength: Generally decreased, functional (BLE)  Tone & Sensation:   Tone: Normal  Sensation: Intact (BLE)   Range Of Motion:  AROM: Generally decreased, functional (RLE)  Functional Mobility:  Bed Mobility:  Rolling: Maximum assistance  Supine to Sit: Maximum assistance;Assist x2  Sit to Supine: Maximum assistance;Assist x2  Scooting: Maximum assistance;Assist x2  Transfers:  Sit to Stand: Moderate assistance  Stand to Sit: Moderate assistance  Balance:   Sitting: Intact  Standing: Impaired;With support (RW)  Standing - Static: Fair  Standing - Dynamic : Poor  Ambulation/Gait Training:   N/A  Therapeutic Exercises:   Supine ankle pumps  Pain:  Pain Scale 1: Numeric (0 - 10)  Pain Intensity 1: 2  Activity Tolerance:   Fair  Please refer to the flowsheet for vital signs taken during this treatment.  After treatment:    Patient left in no apparent distress sitting up in chair   Patient left sitting on EOB   Patient left in no apparent distress in bed   Patient declined to be OOB at this time due to   Call bell left within reach   Nursing notified(Lisa)   Caregiver present   Bed alarm activated    COMMUNICATION/EDUCATION:            Fall prevention education was provided and the patient/caregiver indicated understanding.           Patient/family have participated as able in goal setting and plan of care.           Patient/family agree to work toward stated goals and plan of care.            Patient understands intent and goals of therapy, but is neutral about his/her participation.           Patient is unable to participate  in goal setting and plan of care.    Thank you for this referral.  Lavella HammockMarijke A Edwards   Time Calculation: 31 mins      Mobility G8978 Current  CN= 100%  G8979 Goal  CI= 1-19%.  The severity rating is based on the Level of Assistance required for Functional Mobility and ADLs.    Eval Complexity: History: MEDIUM  Complexity : 1-2 comorbidities / personal factors will impact the outcome/ POC Exam:MEDIUM Complexity : 3 Standardized tests and measures addressing body structure, function, activity limitation and / or participation in recreation  Presentation: MEDIUM Complexity : Evolving with changing characteristics Overall Complexity:MEDIUM

## 2016-04-04 NOTE — Progress Notes (Addendum)
Post-Op Progress Note    S: Patient is POD #1 s/p right hip hemiarthroplasty after sustaining a displaced right femoral [facility] Patient reports that her pain is well-controlled on her current regimen. Patient did not have any complaints or issues. Patient denied any CP or SOB.    O:  Visit Vitals   ??? BP 155/73 (BP 1 Location: Left arm, BP Patient Position: At rest)   ??? Pulse 87   ??? Temp 97 ??F (36.1 ??C)   ??? Resp 16   ??? Wt 36.3 kg (80 lb)   ??? SpO2 100%   ??? BMI 15.71 kg/m2     Gen: Alert and cooperative, no acute distress  CV: RRR, no murmurs/rubs/gallops  Resp: Unlabored breathing. Clear to auscultation bilaterally. No wheezes/rales/rhochi  Abd: Soft, nondistened, nontender.  Ext: Right leg in brace with right hip incision dressing c/d/t. 2+ dorsalis pedis pulses bilaterally. Good blood flow in right leg.    A/P:  82 y.o.??female??with PMHx of dementia, HTN, DMII, GERD, iron deficiency anemia, constipation, and osteoarthritis admitted with displaced fracture of right femoral neck now POD #1 s/p right hip hemiarthroplasty.    - Continue celebrex 200 mg BID, lyrica 75 mg BID  - Continue morphine 1-2 mg q2h prn, continue percocet 5-325 1-2 tab q4h prn  - Continue colace + miralax daily  - DVT prophylaxis: Lovenox 40 mg daily  - Continue IS  - Continue management by primary day team    Danny LawlessLinwood T. Dorr Perrot, MD, PGY-1  [facility]  [facility]  04/04/2016, 12:33 AM

## 2016-04-04 NOTE — Progress Notes (Addendum)
Problem: Self Care Deficits Care Plan (Adult)  Goal: *Acute Goals and Plan of Care (Insert Text)  Occupational Therapy Goals  Initiated 04/04/2016 within 7 day(s).    1.  Patient will perform upper body dressing with supervision/set-up   3.  Patient will perform lower body dressing with minimal assistance.  4.  Patient will perform toilet transfers with minimal assistance.  5.  Patient will perform all aspects of toileting with minimal assistance/contact guard assist.  6.  Patient will participate in upper extremity therapeutic exercise/activities with supervision/set-up for 10 minutes.    7.  Patient will utilize energy conservation techniques during functional activities with verbal cues.  Occupational Therapy EVALUATION    [name] ([age] y.o. female)  Date: 04/04/2016  Primary Diagnosis: Femoral neck fracture (HCC)  DX  Procedure(s) (LRB):  RIGHT HIP HEMIARTHROPLASTY/ DEPUY (Right) 1 Day Post-Op   Precautions:  Fall, PWB    ASSESSMENT :  Based on the objective data described below, the patient presents with deficits in ADL independence 2' status post fall and femoral neck fx. Pt was seen POD #1 s/p R hip hemiarthoplasty. Pt is oriented to self and place. Follows commands with repeat verbal cues and tactile cues combined with fair accuracy. Gross weakness to BUE but functional in grooming activity. MAX A supine>< EOB, fair seated balance (SBA-CGA) in grooming activity EOB. Required two person assist sit >stand, MAX A first attempt with progression to MIN-CGA in standing activity. Pt tolerated standing activity well, motivated, requesting one last stand prior to return to supine. Pt naturally leaned towards L, unloading weight to R, maintaining PWB status. Due to pt mild generalized confusion unclear what level of assist she received at her assisted living facility, although anticipate she is presenting below former baseline. Recommend SNF to maximize functional (I) and safety.     Patient will benefit from skilled intervention to address the above impairments.  Patient???s rehabilitation potential is considered to be Good  Factors which may influence rehabilitation potential include:                None noted               Mental ability/status               Medical condition               Home/family situation and support systems               Safety awareness               Pain tolerance/management               Other:      PLAN :  Recommendations and Planned Interventions:                 Self Care Training                          Therapeutic Activities                 Functional Mobility Training            Cognitive Retraining                 Therapeutic Exercises                   Endurance Activities  Engineer, maintenanceBalance Training                           Neuromuscular Re-Education                 Visual/Perceptual Training        Home Safety Training                 Patient Education                         Family Training/Education                 Other (comment):    Frequency/Duration: Patient will be followed by occupational therapy 1-2 times per day/4-7 days per week to address goals.  Discharge Recommendations: Skilled Nursing Facility  Further Equipment Recommendations for Discharge: TBD defer to SNF     Barriers to Learning/Limitations: yes;  Physical/cognitive  Compensate with: visual, verbal, tactile, kinesthetic cues/model     PATIENT COMPLEXITY      Eval Complexity: History: LOW Complexity : Brief history review ; Examination: LOW Complexity : 1-3 performance deficits relating to physical, cognitive , or psychosocial skils that result in activity limitations and / or participation restrictions ; Decision Making:LOW Complexity : No comorbidities that affect functional and no verbal or physical assistance needed to complete eval tasks  Assessment: low Complexity     G-CODES:     Self Care [phone] Current  CM= 80-99%   G8988 Goal  CK= 40-59%.  The severity rating is based on the Level of Assistance required for Functional Mobility and ADLs.    SUBJECTIVE:   Patient stated ???Right there.??? pointing at her R hip, when asked to describe pain.     OBJECTIVE DATA SUMMARY:     Past Medical History:   Diagnosis Date   ??? Arthritis    ??? Diabetes (HCC)    ??? Hypertension      Past Surgical History:   Procedure Laterality Date   ??? HX GYN      hysterectomy     Prior Level of Function/Home Situation: Unclear level of assistance at assisted living, pt unable to accurately report at eval  Home Situation  Home Environment: Assisted living  One/Two Story Residence: One story  Living Alone:  (assisted living)  Support Systems:  (assisted living staff)  Patient Expects to be Discharged to:: Private residence  Current DME Used/Available at Home: Environmental consultantWalker, Biomedical scientistWheelchair (pt self gives unclear self report)    Right hand dominant     Left hand dominant  Cognitive/Behavioral Status:  Neurologic State: Alert  Orientation Level: Disoriented to time;Oriented to place;Oriented to person  Cognition: Decreased command following;Decreased attention/concentration  Safety/Judgement: Lack of insight into deficits    Skin: no noted skin concerns    Edema: no edema noted    Vision/Perceptual:            Acuity: Within Defined Limits         Coordination:  Coordination: Generally decreased, functional            Balance:   fair    Strength:    Strength: Generally decreased, functional (BUE, unable to formal test 2' poor command following)     Tone & Sensation:    Tone: Normal (BUE)  Sensation: Intact (BUE)        Range of Motion:    AROM: Generally decreased, functional (BUE)  Functional Mobility and Transfers for ADLs:  Bed Mobility:  Rolling: Maximum assistance  Supine to Sit: Maximum assistance;Assist x2  Sit to Supine: Maximum assistance;Assist x2     Transfers:  Sit to Stand: Moderate assistance     Bed to Chair:  (not currently appropriate to test)           Toilet Transfer :  (not appropriate to test)   Tub Transfer:  (not appropriate to test)            ADL Assessment:  Feeding: Setup    Oral Facial Hygiene/Grooming: Setup;Stand-by assistance    Bathing: Moderate assistance    Upper Body Dressing: Minimum assistance    Lower Body Dressing: Maximum assistance    Toileting: Maximum assistance        ADL Intervention:        Cognitive Retraining  Safety/Judgement: Lack of insight into deficits    Therapeutic Exercise:      Pain:  Pt reports 0/10 pain or discomfort prior to treatment. ??  Pt unable to scale post session 2' cogn status, no apparent distress    Activity Tolerance:       Please refer to the flowsheet for vital signs taken during this treatment.  After treatment:    Patient left in no apparent distress sitting up in chair   Patient left in no apparent distress in bed   Call bell left within reach   Nursing notified   Caregiver present   Bed alarm activated    COMMUNICATION/EDUCATION:    Home safety education was provided and the patient/caregiver indicated understanding.   Patient/family have participated as able in goal setting and plan of care.   Patient/family agree to work toward stated goals and plan of care.   Patient understands intent and goals of therapy, but is neutral about his/her participation.   Patient is unable to participate in goal setting and plan of care.    Thank you for this referral.  [place] O Kain, OTR\\L  Time Calculation: 32 mins

## 2016-04-04 NOTE — Progress Notes (Signed)
[facility]    Progress Note      Patient: [name] LittlerKatherine Idris               Sex: female          DOA: 04/02/2016         Date of Birth:  1921/05/22      Age:  82 y.o.        LOS:  LOS: 2 days         S/P  Procedure(s):  RIGHT HIP HEMIARTHROPLASTY/ DEPUY               Subjective:     Spoke with the pt this morning, she has a history of dementia. She was very pleasant and seemed in no distress. When ask about pain, eating and voiding she did seem to know what I was asking. Spoke with the nurse and she has done well overnight, not ask for pain medication or appeared in pain. She has had some water but no breakfast yet.    Denies cp, sob, abd pain   Objective:      Blood pressure 162/78, pulse 100, temperature 97.1 ??F (36.2 ??C), resp. rate 16, weight 80 lb (36.3 kg), SpO2 100 %.   Well developed, Well Nourished alert, cooperative, no distress, confused  Incision clean, dry, no drainage, dressing in place  swelling, tenderness and no deformity noted in right hip  Sensory is intact   Motor is intact  nv intact  2+distal pulses  Neg calf tenderness  Neg for facial asymmetry     Labs:  CBC  @  CBC:   Lab Results   Component Value Date/Time    WBC 9.9 04/04/2016 04:34 AM    RBC 3.87 04/04/2016 04:34 AM    HGB 9.2 04/04/2016 04:34 AM    HCT 29.2 04/04/2016 04:34 AM    PLATELET 327 04/04/2016 04:34 AM     BMP:   Lab Results   Component Value Date/Time    Glucose 208 04/04/2016 04:34 AM    Sodium 135 04/04/2016 04:34 AM    Potassium 4.2 04/04/2016 04:34 AM    Chloride 102 04/04/2016 04:34 AM    CO2 23 04/04/2016 04:34 AM    BUN 14 04/04/2016 04:34 AM    Creatinine 0.57 04/04/2016 04:34 AM    Calcium 8.5 04/04/2016 04:34 AM   @  Coagulation  Lab Results   Component Value Date    INR 1.0 04/02/2016      Basic Metabolic Profile  Lab Results   Component Value Date    NA 135 (L) 04/04/2016    CO2 23 04/04/2016    BUN 14 04/04/2016       Medications Reviewed      Assessment/Plan     Principal Problem:     Femoral neck fracture (HCC) (04/02/2016)    Active Problems:    Dementia (04/02/2016)      Essential hypertension (04/02/2016)      Iron deficiency anemia (04/02/2016)      GERD (gastroesophageal reflux disease) (04/02/2016)      Diabetes mellitus type 2, controlled (HCC) (04/02/2016)      Constipation (04/02/2016)      Arthritis (04/02/2016)        Procedure(s):  RIGHT HIP HEMIARTHROPLASTY/ DEPUY :     1. PT and/or OT Consults: YES continue PT/OT: oob to chair, Partial weight bearing operated leg with walker, gentle rom  2. Incision Care:  Routine Incision Care and Dressing Changes as necessary: dressing changes POD#2 and as needed  3. Pain control  4. DVT prophylaxis  - Lovenox 30 mg, SCD  5. Anemia noted - she has a history of anemia, likely due to acute blood loss, will continue to monitor.    4. DISCHARGE PLANNING    Case Manager Intervention : Skilled Nursing Facility (SNF) in the next couple days once ABX are complete, pain is managed and PT/OT have evaluated.        Daniel NonesBrandee L Myeasha Ballowe, PA-C  [facility]  [phone]

## 2016-04-04 NOTE — Progress Notes (Addendum)
Attempted to interview, pt was eating her breakfast.  Spoke with pt's daughter Maximiano CossMaddie Fischel [phone], will be returning [facility].

## 2016-04-04 NOTE — Op Note (Signed)
[facility]  OPERATIVE REPORTS    Name:  [name], [name]  MR#:  [id number]  DOB:  11-Mar-1922  Account #:  [id number]  Date of Adm:  04/02/2016  Date of Surgery:  04/03/2016      PREOPERATIVE DIAGNOSIS: Displaced femoral neck fracture, right  hip.    POSTOPERATIVE DIAGNOSIS: Displaced femoral neck fracture.    PROCEDURES PERFORMED: Bipolar cemented hemiarthroplasty,  right hip.    SURGEON: Bettina GaviaErnesto Luciano-Perez, MD    ESTIMATED BLOOD LOSS: Minimal.    SPECIMENS REMOVED: Femoral head.    ANESTHESIA: General.    COMPLICATIONS: None.    FINDINGS: As above.    BRIEF HISTORY: The patient had fallen sustaining a displaced  femoral neck fracture. Consented for surgery after having discussed at  length possible risks and complications of surgery including infection,  bleeding, recurrence of pain, among other possible problems.    DESCRIPTION OF PROCEDURE: The patient was taken to the  operating room and placed under general endotracheal anesthesia by  the anesthesia staff, placed in the standard lateral decubitus position.  The right leg was prepped with DuraPrep solution and draped as a free  sterile field. A straight lateral approach was used. Subcutaneous  tissues dissected sharply to the level of the IT band, which was incised  in line with the incision. The gluteus medius was detached from the  anterior third subperiosteally and an anterior capsulotomy performed.  The fractured femoral head was removed. Was measured as 40. The  femoral neck was recut. The proximal femur was prepared with 10  degrees of anteversion. A lateralizing reamer and broaches 1 and 2  was left and a calcar reamer was then used. Reduction with a +128  bipolar head was done 40 mm. The trials were removed and cement  centralizer used as well as cement plug. Cement was pressurized and  a #2 Femoral stem was impacted, well lateralized. The femoral head  +14 taper, plus 1.5, 20 mm head was placed with a 40 mm bipolar   component. The hip was reduced and found to be stable. The capsule,  gluteus medius, IT band were closed with #2 Vicryl, subcutaneous  tissues with 2-0 Vicryl and the skin with staples.    Sterile dressings were applied. The patient tolerated the procedure  well and was taken to the recovery room without problems.        Bettina GaviaERNESTO LUCIANO-PEREZ, MD    EL / [email or web address]  D:  04/04/2016   09:38  T:  04/04/2016   12:45  Job #:  [id number]

## 2016-04-04 NOTE — Progress Notes (Signed)
0431- No nausea or vomiting. Patient denies any complaint of pain at this time.

## 2016-04-26 ENCOUNTER — Ambulatory Visit: Admit: 2016-04-26 | Discharge: 2016-04-26 | Payer: MEDICARE | Attending: Sports Medicine | Primary: Internal Medicine

## 2016-04-26 ENCOUNTER — Ambulatory Visit: Attending: Sports Medicine | Primary: Internal Medicine

## 2016-04-26 DIAGNOSIS — M25551 Pain in right hip: Secondary | ICD-10-CM

## 2016-04-26 NOTE — Progress Notes (Signed)
[name] LittlerKatherine Spargur  06/28/1921     HISTORY OF PRESENT ILLNESS  [name] Juarez is a 82 y.o. female who presents today for evaluation s/p Bipolar cemented hemiarthroplasty on 04/03/16. Her pain is 1/10 today. She has been going to PT at the rehab center. PT is going well and her pain is improving since surgery. Patient denies any fever, chills, chest pain, shortness of breath or calf pain. There are no new illness or injuries to report since last seen in the office.    PHYSICAL EXAM:   Visit Vitals   ??? Wt 80 lb (36.3 kg)   ??? BMI 15.71 kg/m2      The patient is a well-developed, well-nourished female in no acute distress.  The patient is alert and oriented times three. The patient appears to be well groomed. Mood and affect are normal.  ORTHOPEDIC EXAM of right hip:  Inspection: No swelling, no bruising, staples in place  Incision, clean, dry, intact  Range of motion: 20 degrees of internal and external rotation  ttp none  Stability: Stable  Strength: n/a    XR - 2 views of the right hip today - show bipolar hemiarthroplasty in good placement    IMPRESSION:      ICD-10-CM ICD-9-CM    1. Right hip pain M25.551 719.45 AMB POC X-RAY RADEX HIP UNI WITH PELVIS 2-3 VIEWS   2. Closed fracture of neck of right femur, initial encounter (HCC) S72.001A 820.8         PLAN:   Pt is doing well post operatively  Discussed surgery at length today  Incisions cleaned, staples removed and steri strips applied today  Will advance to WBAT tolerated with walker today  No refill on pain medication today.  RTC in 4 weeks    Follow-up Disposition: Not on File    Daniel NonesBrandee L Aleni Andrus, PA-C  [facility]

## 2016-09-01 ENCOUNTER — Emergency Department: Admit: 2016-09-01 | Payer: MEDICARE | Primary: Internal Medicine

## 2016-09-01 ENCOUNTER — Inpatient Hospital Stay
Admit: 2016-09-01 | Discharge: 2016-09-02 | Disposition: A | Discharge status: Skilled Nursing Facility | Payer: MEDICARE | Attending: Emergency Medicine

## 2016-09-01 DIAGNOSIS — F0391 Unspecified dementia with behavioral disturbance: Secondary | ICD-10-CM

## 2016-09-01 LAB — CBC WITH AUTOMATED DIFF
ABS. BASOPHILS: 0 10*3/uL (ref 0.0–0.06)
ABS. EOSINOPHILS: 3.1 10*3/uL — ABNORMAL HIGH (ref 0.0–0.4)
ABS. LYMPHOCYTES: 1.3 10*3/uL (ref 0.8–3.5)
ABS. MONOCYTES: 0.5 10*3/uL (ref 0–1.0)
ABS. NEUTROPHILS: 5.3 10*3/uL (ref 1.8–8.0)
BASOPHILS: 0 % (ref 0–3)
EOSINOPHILS: 30 % — ABNORMAL HIGH (ref 0–5)
HCT: 31.7 % — ABNORMAL LOW (ref 35.0–45.0)
HGB: 10.1 g/dL — ABNORMAL LOW (ref 12.0–16.0)
LYMPHOCYTES: 13 % — ABNORMAL LOW (ref 20–51)
MCH: 23.1 PG — ABNORMAL LOW (ref 24.0–34.0)
MCHC: 31.9 g/dL (ref 31.0–37.0)
MCV: 72.4 FL — ABNORMAL LOW (ref 74.0–97.0)
MONOCYTES: 5 % (ref 2–9)
MPV: 9.6 FL (ref 9.2–11.8)
NEUTROPHILS: 52 % (ref 42–75)
PLATELET COMMENTS: ADEQUATE
PLATELET: 342 10*3/uL (ref 135–420)
RBC: 4.38 M/uL (ref 4.20–5.30)
RDW: 13.3 % (ref 11.6–14.5)
WBC: 10.2 10*3/uL (ref 4.6–13.2)

## 2016-09-01 LAB — EKG, 12 LEAD, SUBSEQUENT
Atrial Rate: 140 {beats}/min
Calculated P Axis: 72 degrees
Calculated R Axis: -3 degrees
Calculated T Axis: 155 degrees
P-R Interval: 104 ms
Q-T Interval: 318 ms
QRS Duration: 110 ms
QTC Calculation (Bezet): 485 ms
Ventricular Rate: 140 {beats}/min

## 2016-09-01 LAB — EKG, 12 LEAD, INITIAL
Atrial Rate: 95 {beats}/min
Calculated P Axis: 51 degrees
Calculated R Axis: -27 degrees
Calculated T Axis: 109 degrees
Diagnosis: NORMAL
P-R Interval: 116 ms
Q-T Interval: 366 ms
QRS Duration: 120 ms
QTC Calculation (Bezet): 459 ms
Ventricular Rate: 95 {beats}/min

## 2016-09-01 LAB — URINE MICROSCOPIC ONLY
Epithelial cells: NEGATIVE /lpf (ref 0–5)
RBC: 0 /hpf (ref 0–5)
WBC: 0 /hpf (ref 0–4)

## 2016-09-01 LAB — URINALYSIS W/ RFLX MICROSCOPIC
Bilirubin: NEGATIVE
Blood: NEGATIVE
Glucose: 100 mg/dL — AB
Ketone: NEGATIVE mg/dL
Leukocyte Esterase: NEGATIVE
Nitrites: NEGATIVE
Specific gravity: 1.015 (ref 1.005–1.030)
Urobilinogen: 1 EU/dL (ref 0.2–1.0)
pH (UA): 6.5 (ref 5.0–8.0)

## 2016-09-01 LAB — METABOLIC PANEL, COMPREHENSIVE
A-G Ratio: 0.7 — ABNORMAL LOW (ref 0.8–1.7)
ALT (SGPT): 13 U/L (ref 13–56)
AST (SGOT): 12 U/L — ABNORMAL LOW (ref 15–37)
Albumin: 3.3 g/dL — ABNORMAL LOW (ref 3.4–5.0)
Alk. phosphatase: 84 U/L (ref 45–117)
Anion gap: 9 mmol/L (ref 3.0–18)
BUN/Creatinine ratio: 17 (ref 12–20)
BUN: 14 MG/DL (ref 7.0–18)
Bilirubin, total: 0.2 MG/DL (ref 0.2–1.0)
CO2: 29 mmol/L (ref 21–32)
Calcium: 8.8 MG/DL (ref 8.5–10.1)
Chloride: 101 mmol/L (ref 100–108)
Creatinine: 0.81 MG/DL (ref 0.6–1.3)
GFR est AA: >60 mL/min/{1.73_m2} (ref >60)
GFR est non-AA: >60 mL/min/{1.73_m2} (ref >60)
Globulin: 4.7 g/dL — ABNORMAL HIGH (ref 2.0–4.0)
Glucose: 74 mg/dL (ref 74–99)
Potassium: 4.1 mmol/L (ref 3.5–5.5)
Protein, total: 8 g/dL (ref 6.4–8.2)
Sodium: 139 mmol/L (ref 136–145)

## 2016-09-01 LAB — POC LACTIC ACID
Lactic Acid (POC): 3.9 mmol/L — CR (ref 0.4–2.0)
Lactic Acid (POC): 3 mmol/L — CR (ref 0.4–2.0)

## 2016-09-01 LAB — TSH 3RD GENERATION: TSH: 1.09 u[IU]/mL (ref 0.36–3.74)

## 2016-09-01 LAB — PROTHROMBIN TIME + INR
INR: 1 (ref 0.8–1.2)
Prothrombin time: 12.3 s (ref 11.5–15.2)

## 2016-09-01 LAB — TROPONIN I: Troponin-I, QT: 0.02 NG/ML (ref 0.0–0.045)

## 2016-09-01 LAB — PTT: aPTT: 29.3 s (ref 23.0–36.4)

## 2016-09-01 LAB — MAGNESIUM: Magnesium: 1.7 mg/dL (ref 1.6–2.6)

## 2016-09-01 LAB — EKG 12-LEAD
Atrial Rate: 140 {beats}/min
Atrial Rate: 95 {beats}/min
Diagnosis: NORMAL
P Axis: 51 degrees
P Axis: 72 degrees
P-R Interval: 104 ms
P-R Interval: 116 ms
Q-T Interval: 318 ms
Q-T Interval: 366 ms
QRS Duration: 110 ms
QRS Duration: 120 ms
QTc Calculation (Bazett): 459 ms
QTc Calculation (Bazett): 485 ms
R Axis: -27 degrees
R Axis: -3 degrees
T Axis: 109 degrees
T Axis: 155 degrees
Ventricular Rate: 140 {beats}/min
Ventricular Rate: 95 {beats}/min

## 2016-09-01 MED ORDER — LEVOFLOXACIN IN D5W 750 MG/150 ML IV PIGGY BACK
750 mg/150 mL | INTRAVENOUS | Status: DC
Start: 2016-09-01 — End: 2016-09-01

## 2016-09-01 MED ORDER — LEVOFLOXACIN IN D5W 750 MG/150 ML IV PIGGY BACK
750 mg/150 mL | INTRAVENOUS | Status: DC
Start: 2016-09-01 — End: 2016-09-01
  Administered 2016-09-01: 18:00:00 via INTRAVENOUS

## 2016-09-01 MED ORDER — SODIUM CHLORIDE 0.9 % IJ SYRG
INTRAMUSCULAR | Status: DC | PRN
Start: 2016-09-01 — End: 2016-09-01

## 2016-09-01 MED ORDER — SODIUM CHLORIDE 0.9% BOLUS IV
0.9 % | Freq: Once | INTRAVENOUS | Status: AC
Start: 2016-09-01 — End: 2016-09-01
  Administered 2016-09-01: 18:00:00 via INTRAVENOUS

## 2016-09-01 MED ORDER — LABETALOL 5 MG/ML IV SYRINGE
20 mg/4 mL (5 mg/mL) | INTRAVENOUS | Status: AC
Start: 2016-09-01 — End: 2016-09-01
  Administered 2016-09-01: 23:00:00 via INTRAVENOUS

## 2016-09-01 MED ORDER — HALOPERIDOL LACTATE 5 MG/ML IJ SOLN
5 mg/mL | Freq: Once | INTRAMUSCULAR | Status: AC
Start: 2016-09-01 — End: 2016-09-01
  Administered 2016-09-01: 23:00:00 via INTRAMUSCULAR

## 2016-09-01 MED ORDER — HALOPERIDOL LACTATE 5 MG/ML IJ SOLN
5 mg/mL | INTRAMUSCULAR | Status: AC
Start: 2016-09-01 — End: 2016-09-01
  Administered 2016-09-01: 20:00:00 via INTRAMUSCULAR

## 2016-09-01 MED ORDER — CEFEPIME 2 GRAM SOLUTION FOR INJECTION
2 gram | Freq: Three times a day (TID) | INTRAMUSCULAR | Status: DC
Start: 2016-09-01 — End: 2016-09-01

## 2016-09-01 MED ORDER — WATER FOR INJECTION, STERILE INJECTION
2 gram | Freq: Two times a day (BID) | INTRAMUSCULAR | Status: DC
Start: 2016-09-01 — End: 2016-09-01
  Administered 2016-09-01: 18:00:00 via INTRAVENOUS

## 2016-09-01 MED ORDER — IOPAMIDOL 61 % IV SOLN
300 mg iodine /mL (61 %) | Freq: Once | INTRAVENOUS | Status: AC
Start: 2016-09-01 — End: 2016-09-01
  Administered 2016-09-01: 20:00:00 via INTRAVENOUS

## 2016-09-01 MED ORDER — HALOPERIDOL LACTATE 5 MG/ML IJ SOLN
5 mg/mL | INTRAMUSCULAR | Status: AC
Start: 2016-09-01 — End: 2016-09-01

## 2016-09-01 MED FILL — HALOPERIDOL LACTATE 5 MG/ML IJ SOLN: 5 mg/mL | INTRAMUSCULAR | Qty: 1

## 2016-09-01 MED FILL — LEVOFLOXACIN IN D5W 750 MG/150 ML IV PIGGY BACK: 750 mg/150 mL | INTRAVENOUS | Qty: 150

## 2016-09-01 MED FILL — ISOVUE-300  61 % INTRAVENOUS SOLUTION: 300 mg iodine /mL (61 %) | INTRAVENOUS | Qty: 100

## 2016-09-01 MED FILL — CEFEPIME 2 GRAM SOLUTION FOR INJECTION: 2 gram | INTRAMUSCULAR | Qty: 2

## 2016-09-01 MED FILL — LABETALOL 5 MG/ML IV SYRINGE: 20 mg/4 mL (5 mg/mL) | INTRAVENOUS | Qty: 4

## 2016-09-01 MED FILL — SODIUM CHLORIDE 0.9 % IV: INTRAVENOUS | Qty: 1250

## 2016-09-01 MED FILL — BD POSIFLUSH NORMAL SALINE 0.9 % INJECTION SYRINGE: INTRAMUSCULAR | Qty: 10

## 2016-09-01 NOTE — ED Notes (Signed)
Sitter and pt daughter at bedside.

## 2016-09-01 NOTE — ED Triage Notes (Addendum)
Pt brought to ED [facility] from [facility] Facility. Per nurses at facility pt was not acting herself. Normally pt is A&O to self, place, and situation. However, today she "seemed off" and has been leaning to the left all day. Blood sugar for [facility] 119.

## 2016-09-01 NOTE — Progress Notes (Signed)
Called to assess pt about possible admission vs continued ED treatment and return to NH. Pt with advanced dementia with behavioral disturbance in advanced age (55). No obvious source after head CT, labs, cxr. Urine clean, wbc normal. Lactic acid was mildly elevated and improved after gentle hydration. Pt became tachycardic and had variable periods of agitation. Pulse from 110's to 130's but constantly satting at 100%. No respiratory distress. No evidence of DVT. No right axis deviation noted. Discussed case with pt's family who stated that they thought it would be appropriate that pt go back to NH and be kept comfortable, however NH had reported that they could not take care of her with her behavioral disturbance (which was described as attempting to get up out of bed and trying to move). Pt received haldol in the ED and was slightly calmer for a short period prior to ED calling me. Upon chart review, there did not seem to be any metabolic contribution to the case and the information above was gained at pt's bedside speaking with family. I did reach out to Psychiatry on call, Dr. Jenne Campus, who was very helpful in suggesting low dose seroquel 25 mg bid and then increasing as able at the [facility] facility. If anything IM/IV was required, he suggested leaning on haldol and avoiding other antipsychotics. There is no GeriPsych bed in the facility.     Case was re-discussed with our ED attending, Dr. Janey Greaser, who agreed to trial more IV haldol (was given 2 mg earlier) and then a po trial. If pt is not able/willing to take in PO, we will re-discuss the case and move forward as need be. Otherwise, pt would be able to be transferred back to the NH with recommendations for prn IV/IM haldol and low dose scheduled seroquel.     Please call if needed.     Shona Simpson, MD  [facility]   Internal Medicine  Hospitalist Division  [phone]

## 2016-09-01 NOTE — H&P (Signed)
Hospitalist Admission Note    NAME: [name] Juarez   DOB:  Nov 24, 1921   MRN:  [id number]     Date/Time of admission:  09/01/2016 8:43 PM    Patient PCP: Oris Drone Other, MD  ________________________________________________________________________    My assessment of this patient's clinical condition and my plan of care is as follows.    Assessment / Plan:  1. Advanced dementia with likely behavioral disturbance  2. Sinus tachycardia with mild lactic acidosis  3. Moderate protein calorie malnutrition  4. Advanced age  67. Chronic severe T11 compression deformity - worsened since 03/05/16 - but no c/o pain  6. Moderate hiatal hernia    1. Will gently hydrate and monitor  2. TSH normal, images normal, renal function and lytes normal, cbc normal, ct head normal. Ordered an echo to assure no vague reason for mild tachycardia other than agitation.   3. Discussed case with Dr. Tami Ribas  4. Will start prn seroquel 25 mg bid and prn haldol  5. Back to NH in am. Follow cultures taken by ED.   6. Can possibly consider comfort measures back at NH as to avoid continuing to disregard her dignity at this point in her life.     Code Status: DNR/DNI  Surrogate Decision Maker: family    DVT Prophylaxis: sc hep  GI Prophylaxis: not indicated          Subjective:   CHIEF COMPLAINT: agitation    HISTORY OF PRESENT ILLNESS:     [name] Juarez is a 83 y.o.  African American female who presents with agitation with known advanced dementia. Called to assess pt about possible admission vs continued ED treatment and return to NH. Pt with advanced dementia with behavioral disturbance in advanced age (31). No obvious source after head CT, labs, cxr. Urine clean, wbc normal. Lactic acid was mildly elevated and improved after gentle hydration. Pt became tachycardic and had variable periods of agitation. Pulse from 110's to 130's but constantly satting at 100%. No respiratory distress. No evidence of DVT.  No right axis deviation noted. Discussed case with pt's family who stated that they thought it would be appropriate that pt go back to NH and be kept comfortable, however NH had reported that they could not take care of her with her behavioral disturbance (which was described as attempting to get up out of bed and trying to move). Pt received haldol in the ED and was slightly calmer for a short period prior to ED calling me. Upon chart review, there did not seem to be any metabolic contribution to the case and the information above was gained at pt's bedside speaking with family. I did reach out to Psychiatry on call, Dr. Tami Ribas, who was very helpful in suggesting low dose seroquel 25 mg bid and then increasing as able at the [facility] facility. If anything IM/IV was required, he suggested leaning on haldol and avoiding other antipsychotics. There is no GeriPsych bed in the facility.   ??  Case was re-discussed with our ED attending, Dr. Orma Render, who agreed to trial more IV haldol (was given 2 mg earlier) and then a po trial. Dr. Cherlyn Juarez stated he tried a po challenge with a cup of water and pt would not take it. He therefore requested admission. I did speak with the nursing staff at the NH who verified that patient was agitated this morning, but they have no documentation of any evidence of infection (however, there was concern of such). Pt currently is calm  and without complaints without a sitter and resting and answering questions appropriately.     We were asked to admit for work up and evaluation of the above problems.     Past Medical History:   Diagnosis Date   ??? Arthritis    ??? Diabetes ([place])    ??? Hypertension         Past Surgical History:   Procedure Laterality Date   ??? HX GYN      hysterectomy   ??? HX HIP REPLACEMENT         Social History   Substance Use Topics   ??? Smoking status: Never Smoker   ??? Smokeless tobacco: Never Used   ??? Alcohol use No        No family history on file.  No Known Allergies      Prior to Admission medications    Medication Sig Start Date End Date Taking? Authorizing Provider   oxyCODONE-acetaminophen (PERCOCET) 5-325 mg per tablet Take 1-2 Tabs by mouth every four (4) hours as needed. Max Daily Amount: 12 Tabs. 04/04/16   Reggie Pile, MD   amLODIPine (NORVASC) 10 mg tablet Take 10 mg by mouth daily. Indications: hypertension    Historical Provider   hydrALAZINE (APRESOLINE) 10 mg tablet Take 10 mg by mouth two (2) times a day.    Historical Provider   docusate sodium (COLACE) 100 mg capsule Take 100 mg by mouth daily. Indications: constipation    Historical Provider   raNITIdine (ZANTAC) 300 mg tablet Take 300 mg by mouth nightly.    Historical Provider   ferrous sulfate (IRON) 325 mg (65 mg iron) EC tablet Take 325 mg by mouth three (3) times daily (with meals).    Historical Provider   traMADol (ULTRAM) 50 mg tablet Take 50 mg by mouth every six (6) hours as needed for Pain.    Historical Provider       REVIEW OF SYSTEMS:     I am not able to complete the review of systems because:   The patient is intubated and sedated    The patient has altered mental status due to his acute medical problems    The patient has baseline aphasia from prior stroke(s)   x The patient has baseline dementia and is not reliable historian    The patient is in acute medical distress and unable to provide information           Total of 12 systems reviewed as follows:       POSITIVE= bolded text  Negative = text not underlined  General:  fever, chills, sweats, generalized weakness, weight loss/gain,      loss of appetite   Eyes:    blurred vision, eye pain, loss of vision, double vision  ENT:    rhinorrhea, pharyngitis   Respiratory:   cough, sputum production, SOB, DOE, wheezing, pleuritic pain   Cardiology:   chest pain, palpitations, orthopnea, PND, edema, syncope   Gastrointestinal:  abdominal pain , N/V, diarrhea, dysphagia, constipation, bleeding    Genitourinary:  frequency, urgency, dysuria, hematuria, incontinence   Muskuloskeletal :  arthralgia, myalgia, back pain  Hematology:  easy bruising, nose or gum bleeding, lymphadenopathy   Dermatological: rash, ulceration, pruritis, color change / jaundice  Endocrine:   hot flashes or polydipsia   Neurological:  headache, dizziness, confusion, focal weakness, paresthesia,     Speech difficulties, memory loss, gait difficulty  Psychological: Feelings of anxiety, depression, agitation    Objective:  VITALS:    Visit Vitals   ??? BP 123/51   ??? Pulse 97   ??? Temp 98.7 ??F (37.1 ??C)   ??? Resp 16   ??? SpO2 100%       PHYSICAL EXAM:    General:    Alert, cooperative, no distress, appears stated age, thin.     HEENT: Atraumatic, anicteric sclerae, pink conjunctivae     No oral ulcers, mucosa moist, throat clear, dentition fair  Neck:  Supple, symmetrical,  thyroid: non tender  Lungs:   Clear to auscultation bilaterally.  No Wheezing or Rhonchi. No rales.  Chest wall:  No tenderness  No Accessory muscle use.  Heart:   Regular  rhythm,  No  murmur   No edema  Abdomen:   Soft, non-tender. Not distended.  Bowel sounds normal  Extremities: No cyanosis.  No clubbing,      Skin turgor normal, Capillary refill normal, Radial dial pulse 2+  Skin:     Not pale.  Not Jaundiced  No rashes   Psych:  Good insight.  Not depressed.  Not anxious or agitated.  Neurologic: EOMs intact. No facial asymmetry. No aphasia or slurred speech. Symmetrical strength, Sensation grossly intact. Alert and oriented X 4.     _______________________________________________________________________  Care Plan discussed with:    Comments   Patient x    Family  x    RN     Care Manager                    Consultant:      _______________________________________________________________________  Expected  Disposition:   Home with Family    HH/PT/OT/RN    SNF/LTC x   [facility]    ________________________________________________________________________   TOTAL TIME:  70 Minutes    Critical Care Provided     Minutes non procedure based      Comments    x Reviewed previous records   >50% of visit spent in counseling and coordination of care x Discussion with patient and/or family and questions answered       ________________________________________________________________________      Procedures: see electronic medical records for all procedures/Xrays and details which were not copied into this note but were reviewed prior to creation of Plan.    LAB DATA REVIEWED:    Recent Results (from the past 24 hour(s))   EKG, 12 LEAD, INITIAL    Collection Time: 09/01/16 12:42 PM   Result Value Ref Range    Ventricular Rate 95 BPM    Atrial Rate 95 BPM    P-R Interval 116 ms    QRS Duration 120 ms    Q-T Interval 366 ms    QTC Calculation (Bezet) 459 ms    Calculated P Axis 51 degrees    Calculated R Axis -27 degrees    Calculated T Axis 109 degrees    Diagnosis       Normal sinus rhythm    Left bundle branch block  Abnormal ECG  When compared with ECG of 05-Mar-2016 16:06,  Left bundle branch block is now present  Confirmed by Elita Quick [id number]) on 09/01/2016 3:38:52 PM     CBC WITH AUTOMATED DIFF    Collection Time: 09/01/16  1:32 PM   Result Value Ref Range    WBC 10.2 4.6 - 13.2 K/uL    RBC 4.38 4.20 - 5.30 M/uL    HGB 10.1 (L) 12.0 - 16.0 g/dL    HCT 31.7 (L)  35.0 - 45.0 %    MCV 72.4 (L) 74.0 - 97.0 FL    MCH 23.1 (L) 24.0 - 34.0 PG    MCHC 31.9 31.0 - 37.0 g/dL    RDW 13.3 11.6 - 14.5 %    PLATELET 342 135 - 420 K/uL    MPV 9.6 9.2 - 11.8 FL    NEUTROPHILS 52 42 - 75 %    LYMPHOCYTES 13 (L) 20 - 51 %    MONOCYTES 5 2 - 9 %    EOSINOPHILS 30 (H) 0 - 5 %    BASOPHILS 0 0 - 3 %    ABS. NEUTROPHILS 5.3 1.8 - 8.0 K/UL    ABS. LYMPHOCYTES 1.3 0.8 - 3.5 K/UL    ABS. MONOCYTES 0.5 0 - 1.0 K/UL    ABS. EOSINOPHILS 3.1 (H) 0.0 - 0.4 K/UL    ABS. BASOPHILS 0.0 0.0 - 0.06 K/UL    DF MANUAL      PLATELET COMMENTS ADEQUATE PLATELETS      RBC COMMENTS MICROCYTOSIS  1+         RBC COMMENTS TARGET CELLS  FEW  OVALOCYTES  FEW       METABOLIC PANEL, COMPREHENSIVE    Collection Time: 09/01/16  1:32 PM   Result Value Ref Range    Sodium 139 136 - 145 mmol/L    Potassium 4.1 3.5 - 5.5 mmol/L    Chloride 101 100 - 108 mmol/L    CO2 29 21 - 32 mmol/L    Anion gap 9 3.0 - 18 mmol/L    Glucose 74 74 - 99 mg/dL    BUN 14 7.0 - 18 MG/DL    Creatinine 0.81 0.6 - 1.3 MG/DL    BUN/Creatinine ratio 17 12 - 20      GFR est AA >60 >60 ml/min/1.80m    GFR est non-AA >60 >60 ml/min/1.76m   Calcium 8.8 8.5 - 10.1 MG/DL    Bilirubin, total 0.2 0.2 - 1.0 MG/DL    ALT (SGPT) 13 13 - 56 U/L    AST (SGOT) 12 (L) 15 - 37 U/L    Alk. phosphatase 84 45 - 117 U/L    Protein, total 8.0 6.4 - 8.2 g/dL    Albumin 3.3 (L) 3.4 - 5.0 g/dL    Globulin 4.7 (H) 2.0 - 4.0 g/dL    A-G Ratio 0.7 (L) 0.8 - 1.7     MAGNESIUM    Collection Time: 09/01/16  1:32 PM   Result Value Ref Range    Magnesium 1.7 1.6 - 2.6 mg/dL   TROPONIN I    Collection Time: 09/01/16  1:32 PM   Result Value Ref Range    Troponin-I, Qt. 0.02 0.0 - 0.045 NG/ML   PROTHROMBIN TIME + INR    Collection Time: 09/01/16  1:32 PM   Result Value Ref Range    Prothrombin time 12.3 11.5 - 15.2 sec    INR 1.0 0.8 - 1.2     PTT    Collection Time: 09/01/16  1:32 PM   Result Value Ref Range    aPTT 29.3 23.0 - 36.4 SEC   POC LACTIC ACID    Collection Time: 09/01/16  1:32 PM   Result Value Ref Range    Lactic Acid (POC) 3.9 (HH) 0.4 - 2.0 mmol/L   TSH 3RD GENERATION    Collection Time: 09/01/16  1:32 PM   Result Value Ref Range    TSH 1.09 0.36 -  3.73 uIU/mL   URINALYSIS W/ RFLX MICROSCOPIC    Collection Time: 09/01/16  1:35 PM   Result Value Ref Range    Color YELLOW      Appearance CLEAR      Specific gravity 1.015 1.005 - 1.030      pH (UA) 6.5 5.0 - 8.0      Protein TRACE (A) NEG mg/dL    Glucose 100 (A) NEG mg/dL    Ketone NEGATIVE  NEG mg/dL    Bilirubin NEGATIVE  NEG      Blood NEGATIVE  NEG      Urobilinogen 1.0 0.2 - 1.0 EU/dL    Nitrites NEGATIVE  NEG       Leukocyte Esterase NEGATIVE  NEG     URINE MICROSCOPIC ONLY    Collection Time: 09/01/16  1:35 PM   Result Value Ref Range    WBC 0 to 3 0 - 4 /hpf    RBC 0 to 3 0 - 5 /hpf    Epithelial cells NEGATIVE  0 - 5 /lpf    Bacteria FEW (A) NEG /hpf   EKG, 12 LEAD, SUBSEQUENT    Collection Time: 09/01/16  3:06 PM   Result Value Ref Range    Ventricular Rate 140 BPM    Atrial Rate 140 BPM    P-R Interval 104 ms    QRS Duration 110 ms    Q-T Interval 318 ms    QTC Calculation (Bezet) 485 ms    Calculated P Axis 72 degrees    Calculated R Axis -3 degrees    Calculated T Axis 155 degrees    Diagnosis       Sinus tachycardia with short PR with premature supraventricular complexes  Incomplete left bundle branch block  Marked ST abnormality, possible lateral subendocardial injury  Abnormal ECG  When compared with ECG of 01-Sep-2016 12:42,  premature supraventricular complexes are now present  ST no longer elevated in Anterior leads  ST now depressed in Lateral leads  T wave inversion more evident in Lateral leads  Confirmed by Elita Quick ([id number]) on 09/01/2016 3:39:32 PM     POC LACTIC ACID    Collection Time: 09/01/16  4:42 PM   Result Value Ref Range    Lactic Acid (POC) 3.0 (HH) 0.4 - 2.0 mmol/L       Paulino Door, MD  Internal Medicine  Hospitalist Division

## 2016-09-01 NOTE — ED Notes (Signed)
Report given to Kelsey, RN.

## 2016-09-01 NOTE — ED Notes (Signed)
TRANSFER - OUT REPORT:    Verbal report given to [facility] RN(name) on [name] Juarez  being transferred to 4 N(unit) for routine progression of care   Report consisted of patient???s Situation, Background, Assessment and Recommendations(SBAR). Information from the following report(s) SBAR, ED Summary, Procedure Summary, MAR, Recent Results and Cardiac Rhythm STach was reviewed with the receiving nurse. Opportunity for questions and clarification was provided.

## 2016-09-01 NOTE — ED Provider Notes (Signed)
EMERGENCY DEPARTMENT HISTORY AND PHYSICAL EXAM    12:34 PM      Date: 09/01/2016  Patient Name: [name] Juarez    History of Presenting Illness     Chief Complaint   Patient presents with   ??? Altered mental status         History Provided By: EMS    Chief Complaint: AMS  Duration:  Hours  Timing:  Acute  Location: neuro  Quality: n/a  Severity: N/A  Modifying Factors: none  Associated Symptoms: n/a      Additional History (Context): [name] Juarez is a 83 y.o. female with diabetes, hypertension and arthritis who presents via EMS from a nursing facility due to the pt being altered. Per EMS the pt is normally alert to self, place and event but today she has not been; she also usually able to follow commands with out issues but she has not been doing so at the facility. While presenting the pt is able to follow basic commands and responds to her name. The medic says the facility reports that the pt was also slouching over in her wheel chair to the left and seems to have a left sided facial droop. The last time this pt was seen normal at her baseline is unknown. Per the family the pt was not her self yesterday. Reports the pt was on hospice last year; was taking off after being in the hospital for hip fx. The pt HPI and ROS is limited due to mental status change.      PCP: Phys Other, MD    Current Outpatient Prescriptions   Medication Sig Dispense Refill   ??? QUEtiapine (SEROQUEL) 25 mg tablet Take 1 Tab by mouth two (2) times a day. 60 Tab 0   ??? amLODIPine (NORVASC) 10 mg tablet Take 10 mg by mouth daily. Indications: hypertension     ??? hydrALAZINE (APRESOLINE) 10 mg tablet Take 10 mg by mouth two (2) times a day.     ??? docusate sodium (COLACE) 100 mg capsule Take 100 mg by mouth daily. Indications: constipation     ??? raNITIdine (ZANTAC) 300 mg tablet Take 300 mg by mouth nightly.     ??? ferrous sulfate (IRON) 325 mg (65 mg iron) EC tablet Take 325 mg by mouth three (3) times daily (with meals).      ??? traMADol (ULTRAM) 50 mg tablet Take 50 mg by mouth every six (6) hours as needed for Pain.         Past History     Past Medical History:  Past Medical History:   Diagnosis Date   ??? Arthritis    ??? Diabetes ([place])    ??? Hypertension        Past Surgical History:  Past Surgical History:   Procedure Laterality Date   ??? HX GYN      hysterectomy   ??? HX HIP REPLACEMENT         Family History:  No family history on file.    Social History:  Social History   Substance Use Topics   ??? Smoking status: Never Smoker   ??? Smokeless tobacco: Never Used   ??? Alcohol use No       Allergies:  No Known Allergies      Review of Systems       Review of Systems   Unable to perform ROS: Mental status change         Physical Exam     Visit  Vitals   ??? BP 139/72 (BP 1 Location: Left arm, BP Patient Position: At rest)   ??? Pulse 91   ??? Temp 99.4 ??F (37.4 ??C)   ??? Resp 18   ??? Wt 41.1 kg (90 lb 9.6 oz)   ??? SpO2 97%   ??? Breastfeeding No   ??? BMI 17.79 kg/m2         Physical Exam   Constitutional: She appears well-developed and well-nourished. No distress.   Mild L sided facial weakness   HENT:   Head: Normocephalic and atraumatic.   Right Ear: External ear normal.   Left Ear: External ear normal.   Nose: Nose normal.   Mouth/Throat: Oropharynx is clear and moist.   Eyes: Conjunctivae and EOM are normal. Pupils are equal, round, and reactive to light. No scleral icterus.   Neck: Normal range of motion. Neck supple. No JVD present. No tracheal deviation present. No thyromegaly present.   Cardiovascular: Normal rate, regular rhythm, normal heart sounds and intact distal pulses.  Exam reveals no gallop and no friction rub.    No murmur heard.  Pulmonary/Chest: Effort normal and breath sounds normal. She exhibits no tenderness.   Abdominal: Soft. Bowel sounds are normal. She exhibits no distension. There is no tenderness. There is no rebound and no guarding.   Musculoskeletal: Normal range of motion. She exhibits no edema or tenderness.   Foot drop B     Lymphadenopathy:     She has no cervical adenopathy.   Neurological: She is alert. No cranial nerve deficit. Coordination normal.   Oriented x 2, globally weak, follows commands, seems to lean to the L, L sided facial weakness    Skin: Skin is warm and dry.   Psychiatric: She has a normal mood and affect. Her behavior is normal. Judgment and thought content normal.   Has DNR in records from SNF, no family currently at the bedside      Nursing note and vitals reviewed.        Diagnostic Study Results     Labs -  Recent Results (from the past 12 hour(s))   EKG, 12 LEAD, INITIAL    Collection Time: 09/01/16 12:42 PM   Result Value Ref Range    Ventricular Rate 95 BPM    Atrial Rate 95 BPM    P-R Interval 116 ms    QRS Duration 120 ms    Q-T Interval 366 ms    QTC Calculation (Bezet) 459 ms    Calculated P Axis 51 degrees    Calculated R Axis -27 degrees    Calculated T Axis 109 degrees    Diagnosis       Normal sinus rhythm    Left bundle branch block  Abnormal ECG  When compared with ECG of 05-Mar-2016 16:06,  Left bundle branch block is now present  Confirmed by Elita Quick [id number]) on 09/01/2016 3:38:52 PM     CBC WITH AUTOMATED DIFF    Collection Time: 09/01/16  1:32 PM   Result Value Ref Range    WBC 10.2 4.6 - 13.2 K/uL    RBC 4.38 4.20 - 5.30 M/uL    HGB 10.1 (L) 12.0 - 16.0 g/dL    HCT 31.7 (L) 35.0 - 45.0 %    MCV 72.4 (L) 74.0 - 97.0 FL    MCH 23.1 (L) 24.0 - 34.0 PG    MCHC 31.9 31.0 - 37.0 g/dL    RDW 13.3 11.6 - 14.5 %  PLATELET 342 135 - 420 K/uL    MPV 9.6 9.2 - 11.8 FL    NEUTROPHILS 52 42 - 75 %    LYMPHOCYTES 13 (L) 20 - 51 %    MONOCYTES 5 2 - 9 %    EOSINOPHILS 30 (H) 0 - 5 %    BASOPHILS 0 0 - 3 %    ABS. NEUTROPHILS 5.3 1.8 - 8.0 K/UL    ABS. LYMPHOCYTES 1.3 0.8 - 3.5 K/UL    ABS. MONOCYTES 0.5 0 - 1.0 K/UL    ABS. EOSINOPHILS 3.1 (H) 0.0 - 0.4 K/UL    ABS. BASOPHILS 0.0 0.0 - 0.06 K/UL    DF MANUAL      PLATELET COMMENTS ADEQUATE PLATELETS      RBC COMMENTS MICROCYTOSIS  1+         RBC COMMENTS TARGET CELLS  FEW  OVALOCYTES  FEW       METABOLIC PANEL, COMPREHENSIVE    Collection Time: 09/01/16  1:32 PM   Result Value Ref Range    Sodium 139 136 - 145 mmol/L    Potassium 4.1 3.5 - 5.5 mmol/L    Chloride 101 100 - 108 mmol/L    CO2 29 21 - 32 mmol/L    Anion gap 9 3.0 - 18 mmol/L    Glucose 74 74 - 99 mg/dL    BUN 14 7.0 - 18 MG/DL    Creatinine 0.81 0.6 - 1.3 MG/DL    BUN/Creatinine ratio 17 12 - 20      GFR est AA >60 >60 ml/min/1.59m    GFR est non-AA >60 >60 ml/min/1.71m   Calcium 8.8 8.5 - 10.1 MG/DL    Bilirubin, total 0.2 0.2 - 1.0 MG/DL    ALT (SGPT) 13 13 - 56 U/L    AST (SGOT) 12 (L) 15 - 37 U/L    Alk. phosphatase 84 45 - 117 U/L    Protein, total 8.0 6.4 - 8.2 g/dL    Albumin 3.3 (L) 3.4 - 5.0 g/dL    Globulin 4.7 (H) 2.0 - 4.0 g/dL    A-G Ratio 0.7 (L) 0.8 - 1.7     MAGNESIUM    Collection Time: 09/01/16  1:32 PM   Result Value Ref Range    Magnesium 1.7 1.6 - 2.6 mg/dL   TROPONIN I    Collection Time: 09/01/16  1:32 PM   Result Value Ref Range    Troponin-I, Qt. 0.02 0.0 - 0.045 NG/ML   PROTHROMBIN TIME + INR    Collection Time: 09/01/16  1:32 PM   Result Value Ref Range    Prothrombin time 12.3 11.5 - 15.2 sec    INR 1.0 0.8 - 1.2     PTT    Collection Time: 09/01/16  1:32 PM   Result Value Ref Range    aPTT 29.3 23.0 - 36.4 SEC   POC LACTIC ACID    Collection Time: 09/01/16  1:32 PM   Result Value Ref Range    Lactic Acid (POC) 3.9 (HH) 0.4 - 2.0 mmol/L   URINALYSIS W/ RFLX MICROSCOPIC    Collection Time: 09/01/16  1:35 PM   Result Value Ref Range    Color YELLOW      Appearance CLEAR      Specific gravity 1.015 1.005 - 1.030      pH (UA) 6.5 5.0 - 8.0      Protein TRACE (A) NEG mg/dL    Glucose 100 (A)  NEG mg/dL    Ketone NEGATIVE  NEG mg/dL    Bilirubin NEGATIVE  NEG      Blood NEGATIVE  NEG      Urobilinogen 1.0 0.2 - 1.0 EU/dL    Nitrites NEGATIVE  NEG      Leukocyte Esterase NEGATIVE  NEG     URINE MICROSCOPIC ONLY    Collection Time: 09/01/16  1:35 PM    Result Value Ref Range    WBC 0 to 3 0 - 4 /hpf    RBC 0 to 3 0 - 5 /hpf    Epithelial cells NEGATIVE  0 - 5 /lpf    Bacteria FEW (A) NEG /hpf   EKG, 12 LEAD, SUBSEQUENT    Collection Time: 09/01/16  3:06 PM   Result Value Ref Range    Ventricular Rate 140 BPM    Atrial Rate 140 BPM    P-R Interval 104 ms    QRS Duration 110 ms    Q-T Interval 318 ms    QTC Calculation (Bezet) 485 ms    Calculated P Axis 72 degrees    Calculated R Axis -3 degrees    Calculated T Axis 155 degrees    Diagnosis       Sinus tachycardia with short PR with premature supraventricular complexes  Incomplete left bundle branch block  Marked ST abnormality, possible lateral subendocardial injury  Abnormal ECG  When compared with ECG of 01-Sep-2016 12:42,  premature supraventricular complexes are now present  ST no longer elevated in Anterior leads  ST now depressed in Lateral leads  T wave inversion more evident in Lateral leads  Confirmed by Elita Quick [id number]) on 09/01/2016 3:39:32 PM     POC LACTIC ACID    Collection Time: 09/01/16  4:42 PM   Result Value Ref Range    Lactic Acid (POC) 3.0 (HH) 0.4 - 2.0 mmol/L       Radiologic Studies -   CT ABD PELV W CONT   Final Result      XR CHEST SNGL V   Final Result      CT HEAD WO CONT   Final Result        - CT head:  IMPRESSION  Limited exam due to patient positioning and motion.  1.  No acute intracranial pathology.  ??  2.  Moderate chronic microangiopathic changes and volume loss.  ??  3.  Right mastoid effusion.    -XR Chest:  ??Impression:  1.  No acute cardiopulmonary process.    -CT ABD:  IMPRESSION:  ??  1. No clearly acute findings within abdomen and pelvis. Evaluation limited as  above.  ??  2. Severe T11 compression deformity has worsened since 03/05/2016, as above.  ??  3. Right kidney with probable subcentimeter cysts and cortical scarring slightly  increased since prior. Consider sequela of infection or small infarcts. Left  kidney probable subcentimeter cysts as well.  ??   4. Moderate size hiatal hernia.  -Cecal diverticulosis.  -Status post hysterectomy and right hip arthroplasty.    Medical Decision Making   I am the first provider for this patient.    I reviewed the vital signs, available nursing notes, past medical history, past surgical history, family history and social history.    Vital Signs-Reviewed the patient's vital signs.    Pulse Oximetry Analysis -  100 on room air (Interpretation)normal    EKG:  Interpreted by the EP.   Time Interpreted: 12:42  Rate: 95   Rhythm: Normal Sinus Rhythm with LBBB   Interpretation: No STEMI   Comparison: LBBB change since previous EKG 02/2016    Subsequent EKG read at 1:40pm: Sinus tachycardia @ 140 bpm. LBBB. No STEMI.    Records Reviewed: Nursing Notes and Old Medical Records (Time of Review: 12:34 PM)    ED Course: Progress Notes, Reevaluation, and Consults:  3:45 PM attempted to call [facility] and the person who answered the phone stated that it was the wrong number.    5:10 PM, 09/01/2016   Consult:  Discussed care with Dr. Carlis Abbott, hospitalist. Standard discussion; including history of patient???s chief complaint, available diagnostic results, and treatment course. States he will come down to evaluate the pt.    6:15 PM Dr. Carlis Abbott seen the pt; says he feels like the pt need to be treated supportively for agitation. He called a psychiatrist who recommended that we give the pt 10m of Seroquel BID. The pt is unable to take the medication PO due to her agitation; he would like to give another dose of Haldol. He states if the pt does not improve and is still unable to tolerate PO he will consider admission.    Provider Notes (Medical Decision Making):   Pt is a 89yofemale with a hx of vascular dementia, dysphagia, DM, HTN, weakness with debility presents after being found this AM to be weaker than her baseline.  Exact onset is difficult to obtain.  Medical transport noted some L sided weakness on their evaluation.  In the ED she is  globally weak, follows commands, and does have some facial asymmetry.  Will initiate evaluation for vascular or metabolic causes of her change.  Will follow CT head, lactate, blood cultures, then reevaluate. DSallye Ober DO 1:08 PM    Pt is getting agitated and tachycardic.  Pt tremulous and rectal temp is reassuring.  Given the lactate will CT AP however she is too agitated.  Will give IM haldol and reevaluate. DSallye Ober DO 3:41 PM    Pt has improved after haldol. HR remains elevated and CT AP notes no clear evidence of infection.  Pt daughter is at the bedside. She notes that her mother was in hospice but improved. Pt lactate is slightly improved and BP is reassuring.  I discussed options for comfort measures and to go back to the facility vs continued testing and treatment.  Daughter was conflicted but notes the SNF could not manage her agitation.  She does not want aggressive care and remains a DNR, DNI but would like to see if she improves.  I will discuss the case with the hospitalist and reevaluate. DSallye Ober DO 4:59 PM    Pt remains confused but is less agitated.  Attempted PO trial with water but will not take anything by mouth.  Will discuss admission with Dr. CCarlis Abbottfor acute metabolic encephalopathy with lactic acid acidosis. DSallye Ober DO 8:02 PM        Diagnosis     Clinical Impression:   1. Acute metabolic encephalopathy    2. Tachycardia    3. Dementia with behavioral disturbance, unspecified dementia type        Disposition: Admit    Follow-up Information     Follow up With Details Comments Contact Info    Phys Other, MD   Patient can only remember the practice name and not the physician             Discharge Medication List  as of 09/02/2016  3:48 PM      START taking these medications    Details   QUEtiapine (SEROQUEL) 25 mg tablet Take 1 Tab by mouth two (2) times a day., Print, Disp-60 Tab, R-0         CONTINUE these medications which have NOT CHANGED    Details    amLODIPine (NORVASC) 10 mg tablet Take 10 mg by mouth daily. Indications: hypertension, Historical Med      hydrALAZINE (APRESOLINE) 10 mg tablet Take 10 mg by mouth two (2) times a day., Historical Med      docusate sodium (COLACE) 100 mg capsule Take 100 mg by mouth daily. Indications: constipation, Historical Med      raNITIdine (ZANTAC) 300 mg tablet Take 300 mg by mouth nightly., Historical Med      ferrous sulfate (IRON) 325 mg (65 mg iron) EC tablet Take 325 mg by mouth three (3) times daily (with meals)., Historical Med      traMADol (ULTRAM) 50 mg tablet Take 50 mg by mouth every six (6) hours as needed for Pain., Historical Med         STOP taking these medications       oxyCODONE-acetaminophen (PERCOCET) 5-325 mg per tablet Comments:   Reason for Stopping:             _______________________________    Attestations:  Scribe [place] acting as a scribe for and in the presence of Everlena Cooper, MD      September 01, 2016 at 12:35 PM       Provider Attestation:      I personally performed the services described in the documentation, reviewed the documentation, as recorded by the scribe in my presence, and it accurately and completely records my words and actions. September 01, 2016 at 12:35 PM - Everlena Cooper, MD    _______________________________

## 2016-09-01 NOTE — ED Notes (Signed)
Unable to obtain blood from both existing  IV sites for repeat lactic at this time.

## 2016-09-02 ENCOUNTER — Inpatient Hospital Stay
Admit: 2016-09-02 | Discharge: 2016-09-03 | Disposition: A | Discharge status: Home / Self Care | Payer: MEDICARE | Attending: Emergency Medicine

## 2016-09-02 DIAGNOSIS — R404 Transient alteration of awareness: Secondary | ICD-10-CM

## 2016-09-02 LAB — GLUCOSE, POC
Glucose (POC): 191 mg/dL — ABNORMAL HIGH (ref 70–110)
Glucose (POC): 195 mg/dL — ABNORMAL HIGH (ref 70–110)
Glucose (POC): 212 mg/dL — ABNORMAL HIGH (ref 70–110)

## 2016-09-02 LAB — POC LACTIC ACID: Lactic Acid (POC): 1.9 mmol/L (ref 0.4–2.0)

## 2016-09-02 LAB — MRSA SCREEN - PCR (NASAL)

## 2016-09-02 LAB — ECHOCARDIOGRAM COMPLETE 2D W DOPPLER W COLOR: Left Ventricular Ejection Fraction: 53

## 2016-09-02 MED ORDER — DOCUSATE SODIUM 100 MG CAP
100 mg | Freq: Every day | ORAL | Status: DC
Start: 2016-09-02 — End: 2016-09-02
  Administered 2016-09-02: 12:00:00 via ORAL

## 2016-09-02 MED ORDER — DEXTROSE 5% IN NORMAL SALINE IV
INTRAVENOUS | Status: DC
Start: 2016-09-02 — End: 2016-09-02
  Administered 2016-09-02: 05:00:00 via INTRAVENOUS

## 2016-09-02 MED ORDER — HEPARIN (PORCINE) 5,000 UNIT/ML IJ SOLN
5000 unit/mL | Freq: Three times a day (TID) | INTRAMUSCULAR | Status: DC
Start: 2016-09-02 — End: 2016-09-02
  Administered 2016-09-02 (×2): via SUBCUTANEOUS

## 2016-09-02 MED ORDER — TRAMADOL 50 MG TAB
50 mg | Freq: Four times a day (QID) | ORAL | Status: DC | PRN
Start: 2016-09-02 — End: 2016-09-02

## 2016-09-02 MED ORDER — QUETIAPINE 25 MG TAB
25 mg | ORAL_TABLET | Freq: Two times a day (BID) | ORAL | 0 refills | Status: DC
Start: 2016-09-02 — End: 2016-09-02

## 2016-09-02 MED ORDER — QUETIAPINE 25 MG TAB
25 mg | Freq: Two times a day (BID) | ORAL | Status: DC
Start: 2016-09-02 — End: 2016-09-02
  Administered 2016-09-02: 12:00:00 via ORAL

## 2016-09-02 MED ORDER — AMLODIPINE 10 MG TAB
10 mg | Freq: Every day | ORAL | Status: DC
Start: 2016-09-02 — End: 2016-09-02
  Administered 2016-09-02: 12:00:00 via ORAL

## 2016-09-02 MED ORDER — PHARMACY INFORMATION NOTE
Freq: Two times a day (BID) | Status: DC
Start: 2016-09-02 — End: 2016-09-02

## 2016-09-02 MED ORDER — HALOPERIDOL LACTATE 5 MG/ML IJ SOLN
5 mg/mL | Freq: Three times a day (TID) | INTRAMUSCULAR | Status: DC | PRN
Start: 2016-09-02 — End: 2016-09-02

## 2016-09-02 MED ORDER — FAMOTIDINE 20 MG TAB
20 mg | Freq: Every evening | ORAL | Status: DC
Start: 2016-09-02 — End: 2016-09-02

## 2016-09-02 MED FILL — HEPARIN (PORCINE) 5,000 UNIT/ML IJ SOLN: 5000 unit/mL | INTRAMUSCULAR | Qty: 1

## 2016-09-02 MED FILL — AMLODIPINE 10 MG TAB: 10 mg | ORAL | Qty: 1

## 2016-09-02 MED FILL — DEXTROSE 5% IN NORMAL SALINE IV: INTRAVENOUS | Qty: 1000

## 2016-09-02 MED FILL — PHARMACY INFORMATION NOTE: Qty: 1

## 2016-09-02 MED FILL — QUETIAPINE 25 MG TAB: 25 mg | ORAL | Qty: 1

## 2016-09-02 MED FILL — DOCUSATE SODIUM 100 MG CAP: 100 mg | ORAL | Qty: 1

## 2016-09-02 NOTE — Progress Notes (Signed)
Echocardiogram completed. Report to follow.

## 2016-09-02 NOTE — ED Notes (Signed)
Pt. Incontinent of urine, peri care given; new brief and chux applied. Pt. Able to state birthdate.

## 2016-09-02 NOTE — Progress Notes (Signed)
Problem: Falls - Risk of  Goal: *Absence of Falls  Document Schmid Fall Risk and appropriate interventions in the flowsheet.   Outcome: Progressing Towards Goal  Fall Risk Interventions:       Mentation Interventions: Adequate sleep, hydration, pain control, Bed/chair exit alarm, Door open when patient unattended, More frequent rounding, Reorient patient, Room close to nurse's station, Update white board    Medication Interventions: Bed/chair exit alarm    Elimination Interventions: Bed/chair exit alarm, Toileting schedule/hourly rounds    History of Falls Interventions: Bed/chair exit alarm, Door open when patient unattended, Room close to nurse's station

## 2016-09-02 NOTE — ED Triage Notes (Signed)
Pt. Brought in by [facility]; [facility] states pt. Was discharged from upstairs today transported back to [facility], but facility refused to accept pt. Back because pt. Was altered. On arrival, pt. Lying in bed with eyes closed in NAD; pt. Opens eyes to sternal rub and verbal stimulus. Able to answer questions appropriately at this time. Denies any pain. Incontinent of urine, peri care given. New chux and gown applied. Placed on cardiac monitor; VSS. Siderails up. Daughter at the bedside.

## 2016-09-02 NOTE — Progress Notes (Signed)
[facility] [id number] received trip ticket #[id number] from [place].  Called [facility], set up transport with Dedra for 2:30 pickup to [facility].  Gave trip #[id number] to [facility]. Informed Felicia (CM) of transport information.

## 2016-09-02 NOTE — ED Notes (Signed)
Attempted to call report to [facility], spoke with [facility] on Unit 2; was then transferred to Unit 1 x 3 with no answer. Charge Nurse, Tiffany made aware.

## 2016-09-02 NOTE — Discharge Summary (Signed)
Discharge Summary    [name] MRN: [id number]  CSN: [id number]    Date of Birth: [birth year]  Age: 83 y.o.  Sex: female    DOA: 09/01/2016 LOS:  LOS: 1 day   Discharge Date: 09/02/2016     Admission Diagnoses:   Altered mental status  Tachycardia     Discharge Diagnoses:    Advanced dementia with acute behavioral disturbance  Sinus tachycardia  Chronic, severe T11 compression deformity  Hiatal hernia  Mild aortic stenosis via echo 7/19   Underweight, BMI 17.8  Advanced age    Discharge Condition: Stable    PHYSICAL EXAM  Visit Vitals   ??? BP 132/67 (BP 1 Location: Left arm, BP Patient Position: Head of bed elevated (Comment degrees))   ??? Pulse (!) 105   ??? Temp 97.1 ??F (36.2 ??C)   ??? Resp 17   ??? Wt 41.1 kg (90 lb 9.6 oz)   ??? SpO2 100%   ??? Breastfeeding No   ??? BMI 17.79 kg/m2       General: In NAD.  Nontoxic-appearing.  HEENT: NCAT.  Sclerae anicteric, EOMI.  Lungs:  Clear, no wheezes.  No accessory muscle use.  Heart:  Regular, mildly tachy.  Abdomen: Soft, NTTP.  Extremities: Warm, no ischemia.  Psych:???? Calm, not agitated.  Neurologic:?? Moves extremities.  Confused/chronically demented.      Hospital Course:   See admission H&P for full details of HPI.  Patient sent to ED last night from a local nursing facility for evaluation of agitation and tachycardia.  There is no evidence for an acute CNS event nor infection.  Discussion was held with family by admitting physician re: plan going forward and daughter was amenable to have patient return to facility following ED evaluation but decision was made to hold patient overnight as observation status for further monitoring.  HRs have improved and patient remains afebrile and without obvious evidence for infection.  2D echo was done to re-evaluate AS noted on study from last year but these results will not change management in any way as patient is not a candidate for any surgical interventions/procedures.   She has responded well to administration of seroquel and is medically stable for return to nursing facility today.      Consults:   None    Significant Diagnostic Studies:   Echo:  Pending at time of transcription.      CT abdomen/pelvis:  IMPRESSION:  ??  1. No clearly acute findings within abdomen and pelvis. Evaluation limited as  above.  ??  2. Severe T11 compression deformity has worsened since 03/05/2016, as above.  ??  3. Right kidney with probable subcentimeter cysts and cortical scarring slightly  increased since prior. Consider sequela of infection or small infarcts. Left  kidney probable subcentimeter cysts as well.  ??  4. Moderate size hiatal hernia.  -Cecal diverticulosis.  -Status post hysterectomy and right hip arthroplasty.        Discharge Medications:     Current Discharge Medication List      START taking these medications    Details   QUEtiapine (SEROQUEL) 25 mg tablet Take 1 Tab by mouth two (2) times a day.  Qty: 60 Tab, Refills: 0         CONTINUE these medications which have NOT CHANGED    Details   amLODIPine (NORVASC) 10 mg tablet Take 10 mg by mouth daily. Indications: hypertension      hydrALAZINE (APRESOLINE) 10 mg tablet Take  10 mg by mouth two (2) times a day.      docusate sodium (COLACE) 100 mg capsule Take 100 mg by mouth daily. Indications: constipation      raNITIdine (ZANTAC) 300 mg tablet Take 300 mg by mouth nightly.      ferrous sulfate (IRON) 325 mg (65 mg iron) EC tablet Take 325 mg by mouth three (3) times daily (with meals).      traMADol (ULTRAM) 50 mg tablet Take 50 mg by mouth every six (6) hours as needed for Pain.         STOP taking these medications       oxyCODONE-acetaminophen (PERCOCET) 5-325 mg per tablet Comments:   Reason for Stopping:               Activity: As tolerated    Diet: Resume previous diet.      Melvern Sample. Lindie Spruce, MD  [facility]

## 2016-09-02 NOTE — Progress Notes (Signed)
Transition of Care (TOC) Plan:      TOC Transportation:       How is patient being transported at discharge? [facility] transportation , stretcher     When?  09/02/16 at 1430   Is transport scheduled?  yes  Follow-up appointment and transportation:     PCP?  [facility]  Will schedule   Specialist?     Time/Date?   [facility] will schedule   Who is transporting to the follow-up appointment?  [facility]    Is transport for follow up appointment scheduled?    Communication plan (with patient/family):      Who is being called?  Patient or Next of Kin?  Responsible party?  [facility], pt's daughter Novella Abraha    What number(s) is to be used?  [facility] [phone], Pt's daughter [phone]    What service provider is calling for [facility] services?      When are they calling?    Readmission Risk?  (Green/Low; Yellow/Moderate; Red/High):   Red/high

## 2016-09-02 NOTE — Progress Notes (Addendum)
Called pt's daughter Sussie Minor [phone] who states pt is from [facility] and she wants pt to go back to [facility].    0955:  Chales Abrahams of [facility] and told her pt might come back today as per Dr. Lindie Spruce.     1008:  Pt info uploaded to [facility] via cclink    1050:  Code 44 printed and faxed to [facility].  Pt's daughter Nayelli Inglis gave telehone consent for MOON and OBS forms.  Copies left with pt while originals filed in pt's blue chart.    1240:  Transportation time back to [facility] is 1430:  Nursing and Nija informed.  Called pt's daughter Aarionna Germer and left a message.

## 2016-09-02 NOTE — ED Notes (Signed)
Pt. Resting quietly with eyes closed; easily awakens to voice. NAD observed. VSS. Siderails up.

## 2016-09-02 NOTE — ED Provider Notes (Signed)
EMERGENCY DEPARTMENT HISTORY AND PHYSICAL EXAM    5:08 PM      Date: 09/02/2016  Patient Name: [name] Juarez    History of Presenting Illness     Chief Complaint   Patient presents with   ??? Altered mental status         History Provided By: Patient's Daughter    Chief Complaint: AMS  Duration:  Today  Timing:  Acute  Location: N/A  Quality: N/A  Severity: N/A  Modifying Factors: N/A  Associated Symptoms: N/A      Additional History (Context): 5:09 PM [name] Juarez is a 83 y.o. female with h/o DM, HTN and Dementia who presents to ED for evaluation of acute AMS brought by EMS onset today. Patient was sent to the ED yesterday from the nursing home and was admitted for tachycardia and advanced dementia with behavioral disturbances. Daughter states that she was discharged today and sent back to the nursing home who sent her right back stating that the patient was not acting like herself. Daughter states she usually talks but she does not walk on her own. States she was talking and acting normally 2 days ago. Then today the nursing home staff and the daughter were unable to arouse the patient and she was not answering qeustions. She is easily arousible now during hx. She states she had no pain in general or abd pain. Daughter states that the nursing home says she has not eaten anything all day today.     Hx limited due to dementia.      PCP: Phys Other, MD    Current Outpatient Prescriptions   Medication Sig Dispense Refill   ??? QUEtiapine (SEROQUEL) 25 mg tablet Take 1 Tab by mouth two (2) times a day. 60 Tab 0   ??? amLODIPine (NORVASC) 10 mg tablet Take 10 mg by mouth daily. Indications: hypertension     ??? hydrALAZINE (APRESOLINE) 10 mg tablet Take 10 mg by mouth two (2) times a day.     ??? docusate sodium (COLACE) 100 mg capsule Take 100 mg by mouth daily. Indications: constipation     ??? raNITIdine (ZANTAC) 300 mg tablet Take 300 mg by mouth nightly.      ??? ferrous sulfate (IRON) 325 mg (65 mg iron) EC tablet Take 325 mg by mouth three (3) times daily (with meals).     ??? traMADol (ULTRAM) 50 mg tablet Take 50 mg by mouth every six (6) hours as needed for Pain.         Past History     Past Medical History:  Past Medical History:   Diagnosis Date   ??? Arthritis    ??? Diabetes (HCC)    ??? Hypertension        Past Surgical History:  Past Surgical History:   Procedure Laterality Date   ??? HX GYN      hysterectomy   ??? HX HIP REPLACEMENT         Family History:  No family history on file.    Social History:  Social History   Substance Use Topics   ??? Smoking status: Never Smoker   ??? Smokeless tobacco: Never Used   ??? Alcohol use No       Allergies:  No Known Allergies      Review of Systems     Review of Systems   Unable to perform ROS: Mental status change         Physical Exam  Visit Vitals   ??? BP 135/53   ??? Pulse (!) 101   ??? Temp 98.6 ??F (37 ??C)   ??? Resp 18   ??? SpO2 100%       Physical Exam   Constitutional: She appears well-developed and well-nourished.   Frail appearing   HENT:   Head: Normocephalic and atraumatic.   Eyes: Conjunctivae are normal. No scleral icterus.   Neck: Normal range of motion. Neck supple. No JVD present.   Cardiovascular: Normal rate, regular rhythm and normal heart sounds.    4 intact extremity pulses   Pulmonary/Chest: Effort normal and breath sounds normal.   Abdominal: Soft. She exhibits no mass. There is no tenderness.   Musculoskeletal: Normal range of motion.   Lymphadenopathy:     She has no cervical adenopathy.   Neurological: She is alert. She has normal strength. No cranial nerve deficit. Coordination normal.   grossly intact  Awakes to gentle touch  Follows simple commands     Skin: Skin is warm and dry.   Nursing note and vitals reviewed.        Diagnostic Study Results     Labs -  No results found for this or any previous visit (from the past 12 hour(s)).    Radiologic Studies -   No orders to display     No results found.     Medical Decision Making   Initial Medical Decision Making and DDx:  Yesterday had labs, urine, blood cultures, CT head and a/p, and CXR done. Dont suspect stroke, syncopal event, seizure, encephalopathy.    ED Course: Progress Notes, Reevaluation, and Consults:  5:14 PM Upon records review, the patient was admitted for tachycardia and advanced dementia with behavioral disturbances and her cultures negative from yesterday.     I am the first provider for this patient.    I reviewed the vital signs, available nursing notes, past medical history, past surgical history, family history and social history.    Vital Signs-Reviewed the patient's vital signs.    Pulse Oximetry Analysis - 100% in room air, normal    Records Reviewed: Nursing Notes and Old Medical Records (Time of Review: 5:08 PM)    Diagnosis     Clinical Impression: No diagnosis found.    Disposition:   7:00 PM : Pt care transferred to Dr. Loel Dubonnet  ,ED provider. History of patient complaint(s), available diagnostic reports and current treatment plan has been discussed thoroughly.   Bedside rounding on patient occured : yes .  Intended disposition of patient : Home  Pending diagnostics reports and/or labs (please list):  Labs    Follow-up Information     None           Patient's Medications   Start Taking    No medications on file   Continue Taking    AMLODIPINE (NORVASC) 10 MG TABLET    Take 10 mg by mouth daily. Indications: hypertension    DOCUSATE SODIUM (COLACE) 100 MG CAPSULE    Take 100 mg by mouth daily. Indications: constipation    FERROUS SULFATE (IRON) 325 MG (65 MG IRON) EC TABLET    Take 325 mg by mouth three (3) times daily (with meals).    HYDRALAZINE (APRESOLINE) 10 MG TABLET    Take 10 mg by mouth two (2) times a day.    QUETIAPINE (SEROQUEL) 25 MG TABLET    Take 1 Tab by mouth two (2) times a day.    RANITIDINE (ZANTAC) 300  MG TABLET    Take 300 mg by mouth nightly.    TRAMADOL (ULTRAM) 50 MG TABLET    Take 50 mg by mouth every six (6) hours  as needed for Pain.   These Medications have changed    No medications on file   Stop Taking    No medications on file     _______________________________    Attestations:  Scribe Attestation     Marzetta Merino acting as a Neurosurgeon for and in the presence of Deborra Medina, MD      September 02, 2016 at 5:08 PM       Provider Attestation:      I personally performed the services described in the documentation, reviewed the documentation, as recorded by the scribe in my presence, and it accurately and completely records my words and actions. September 02, 2016 at 5:08 PM - Deborra Medina, MD    _______________________________

## 2016-09-02 NOTE — ED Notes (Signed)
7:00 PM :Pt care assumed from Dr. Cindra Presume, ED provider. Pt complaint(s), current treatment plan, progression and available diagnostic results have been discussed thoroughly.  Rounding occurred: Yes  Intended Disposition: Discharge   Pending diagnostic reports and/or labs (please list): Awaiting labs.     Recent Results (from the past 12 hour(s))   URINALYSIS W/ RFLX MICROSCOPIC    Collection Time: 09/02/16  7:35 PM   Result Value Ref Range    Color YELLOW      Appearance CLEAR      Specific gravity 1.009 1.005 - 1.030      pH (UA) 7.5 5.0 - 8.0      Protein NEGATIVE  NEG mg/dL    Glucose NEGATIVE  NEG mg/dL    Ketone 15 (A) NEG mg/dL    Bilirubin NEGATIVE  NEG      Blood TRACE (A) NEG      Urobilinogen 0.2 0.2 - 1.0 EU/dL    Nitrites NEGATIVE  NEG      Leukocyte Esterase NEGATIVE  NEG     URINE MICROSCOPIC ONLY    Collection Time: 09/02/16  7:35 PM   Result Value Ref Range    WBC 0 to 2 0 - 4 /hpf    RBC 1 to 3 0 - 5 /hpf    Epithelial cells FEW 0 - 5 /lpf    Bacteria NEGATIVE  NEG /hpf   CBC WITH AUTOMATED DIFF    Collection Time: 09/02/16  7:44 PM   Result Value Ref Range    WBC 9.4 4.6 - 13.2 K/uL    RBC 4.53 4.20 - 5.30 M/uL    HGB 10.5 (L) 12.0 - 16.0 g/dL    HCT 16.1 (L) 09.6 - 45.0 %    MCV 71.7 (L) 74.0 - 97.0 FL    MCH 23.2 (L) 24.0 - 34.0 PG    MCHC 32.3 31.0 - 37.0 g/dL    RDW 04.5 40.9 - 81.1 %    PLATELET 326 135 - 420 K/uL    MPV 9.2 9.2 - 11.8 FL    NEUTROPHILS 57 42 - 75 %    BAND NEUTROPHILS 1 0 - 5 %    LYMPHOCYTES 12 (L) 20 - 51 %    MONOCYTES 7 2 - 9 %    EOSINOPHILS 22 (H) 0 - 5 %    BASOPHILS 1 0 - 3 %    ABS. NEUTROPHILS 5.4 1.8 - 8.0 K/UL    ABS. LYMPHOCYTES 1.1 0.8 - 3.5 K/UL    ABS. MONOCYTES 0.7 0 - 1.0 K/UL    ABS. EOSINOPHILS 2.1 (H) 0.0 - 0.4 K/UL    ABS. BASOPHILS 0.1 0.0 - 0.1 K/UL    PLATELET COMMENTS ADEQUATE PLATELETS      RBC COMMENTS TARGET CELLS  FEW  OVALOCYTES  1+        RBC COMMENTS POIKILOCYTOSIS  1+        DF MANUAL     METABOLIC PANEL, BASIC     Collection Time: 09/02/16  7:44 PM   Result Value Ref Range    Sodium 140 136 - 145 mmol/L    Potassium 3.2 (L) 3.5 - 5.5 mmol/L    Chloride 103 100 - 108 mmol/L    CO2 29 21 - 32 mmol/L    Anion gap 8 3.0 - 18 mmol/L    Glucose 125 (H) 74 - 99 mg/dL    BUN 8 7.0 - 18 MG/DL    Creatinine  0.78 0.6 - 1.3 MG/DL    BUN/Creatinine ratio 10 (L) 12 - 20      GFR est AA >60 >60 ml/min/1.44m2    GFR est non-AA >60 >60 ml/min/1.53m2    Calcium 8.9 8.5 - 10.1 MG/DL     9PM- re eval- pt appears back to mental baseline, no long tachycardic. Suspect some element of sun downing, so renewed seroquel as per psych consult yesterday. Pt agreeable with plan so will dc back to nursing facility.     Problem List Items Addressed This Visit     Altered mental status - Primary        Dispo: discharged        Scribe Attestation     Saivarshith Peddireddy acting as a scribe for and in the presence of York Grice, MD      September 02, 2016 at 7:00 PM       Provider Attestation:      I personally performed the services described in the documentation, reviewed the documentation, as recorded by the scribe in my presence, and it accurately and completely records my words and actions. September 02, 2016 at 7:00PM - York Grice, MD

## 2016-09-02 NOTE — Discharge Summary (Signed)
Discharge Summary by Areatha Keas, MD at 09/02/16 1039                Author: Areatha Keas, MD  Service: FAMILY MEDICINE  Author Type: Physician       Filed: 09/02/16 1056  Date of Service: 09/02/16 1039  Status: Signed          Editor: Areatha Keas, MD (Physician)                    Discharge Summary          [name]  MRN: [id number]   CSN: [id number]          Date of Birth: 1921/06/09   Age: 83 y.o.   Sex: female      DOA: 09/01/2016  LOS:  LOS: 1 day    Discharge Date: 09/02/2016        Admission Diagnoses:    Altered mental status   Tachycardia       Discharge Diagnoses:     Advanced dementia with acute behavioral disturbance   Sinus tachycardia   Chronic, severe T11 compression deformity   Hiatal hernia   Mild aortic stenosis via echo 7/19    Underweight, BMI 17.8   Advanced age      Discharge Condition: Stable      PHYSICAL EXAM     Visit Vitals         ?  BP  132/67 (BP 1 Location: Left arm, BP Patient Position: Head of bed elevated (Comment degrees))     ?  Pulse  (!) 105     ?  Temp  97.1 ??F (36.2 ??C)     ?  Resp  17     ?  Wt  41.1 kg (90 lb 9.6 oz)     ?  SpO2  100%     ?  Breastfeeding  No         ?  BMI  17.79 kg/m2           General: In NAD.  Nontoxic-appearing.   HEENT: NCAT.  Sclerae anicteric, EOMI.   Lungs:  Clear, no wheezes.  No accessory muscle use.   Heart:  Regular, mildly tachy.   Abdomen: Soft, NTTP.   Extremities: Warm, no ischemia.   Psych:???? Calm, not agitated.   Neurologic:?? Moves extremities.  Confused/chronically demented.         Hospital Course:    See admission H&P for full details of HPI.   Patient sent to ED last night from a local nursing facility for evaluation of agitation and tachycardia.   There is no evidence for an acute CNS event nor infection.   Discussion was held with family by admitting physician re: plan going forward and daughter was amenable to have patient return to facility following ED evaluation but decision was made to hold  patient overnight as observation status for further monitoring.   HRs have improved and patient remains afebrile and without obvious evidence for infection.   2D echo was done to re-evaluate AS noted on study from last year but these results will not change management in any way as patient is not a candidate for any surgical interventions/procedures.   She has responded well to administration of seroquel and is medically stable for return to nursing facility today.         Consults:    None      Significant Diagnostic Studies:  Echo:   Pending at time of transcription.         CT abdomen/pelvis:   IMPRESSION:   ??   1. No clearly acute findings within abdomen and pelvis. Evaluation limited as   above.   ??   2. Severe T11 compression deformity has worsened since 03/05/2016, as above.   ??   3. Right kidney with probable subcentimeter cysts and cortical scarring slightly   increased since prior. Consider sequela of infection or small infarcts. Left   kidney probable subcentimeter cysts as well.   ??   4. Moderate size hiatal hernia.   -Cecal diverticulosis.   -Status post hysterectomy and right hip arthroplasty.            Discharge Medications:        Current Discharge Medication List              START taking these medications          Details        QUEtiapine (SEROQUEL) 25 mg tablet  Take 1 Tab by mouth two (2) times a day.   Qty: 60 Tab, Refills:  0                     CONTINUE these medications which have NOT CHANGED          Details        amLODIPine (NORVASC) 10 mg tablet  Take 10 mg by mouth daily. Indications: hypertension               hydrALAZINE (APRESOLINE) 10 mg tablet  Take 10 mg by mouth two (2) times a day.               docusate sodium (COLACE) 100 mg capsule  Take 100 mg by mouth daily. Indications: constipation               raNITIdine (ZANTAC) 300 mg tablet  Take 300 mg by mouth nightly.               ferrous sulfate (IRON) 325 mg (65 mg iron) EC tablet  Take 325 mg by mouth three (3) times daily  (with meals).               traMADol (ULTRAM) 50 mg tablet  Take 50 mg by mouth every six (6) hours as needed for Pain.                     STOP taking these medications                  oxyCODONE-acetaminophen (PERCOCET) 5-325 mg per tablet  Comments:    Reason for Stopping:                             Activity: As tolerated      Diet: Resume previous diet.         Melvern Sample. Lindie Spruce, MD  [facility]

## 2016-09-03 ENCOUNTER — Inpatient Hospital Stay
Admit: 2016-09-03 | Discharge: 2016-09-03 | Disposition: A | Discharge status: Home / Self Care | Payer: MEDICARE | Attending: Emergency Medicine

## 2016-09-03 DIAGNOSIS — R4189 Other symptoms and signs involving cognitive functions and awareness: Secondary | ICD-10-CM

## 2016-09-03 LAB — CBC WITH AUTOMATED DIFF
ABS. BASOPHILS: 0.1 10*3/uL (ref 0.0–0.1)
ABS. BASOPHILS: 0 10*3/uL (ref 0.0–0.06)
ABS. EOSINOPHILS: 2.1 10*3/uL — ABNORMAL HIGH (ref 0.0–0.4)
ABS. EOSINOPHILS: 0.7 10*3/uL — ABNORMAL HIGH (ref 0.0–0.4)
ABS. LYMPHOCYTES: 1.1 10*3/uL (ref 0.8–3.5)
ABS. LYMPHOCYTES: 1.2 10*3/uL (ref 0.8–3.5)
ABS. MONOCYTES: 0.7 10*3/uL (ref 0–1.0)
ABS. MONOCYTES: 0.2 10*3/uL (ref 0–1.0)
ABS. NEUTROPHILS: 5.4 10*3/uL (ref 1.8–8.0)
ABS. NEUTROPHILS: 5.7 10*3/uL (ref 1.8–8.0)
BAND NEUTROPHILS: 1 % (ref 0–5)
BASOPHILS: 1 % (ref 0–3)
BASOPHILS: 0 % (ref 0–3)
EOSINOPHILS: 22 % — ABNORMAL HIGH (ref 0–5)
EOSINOPHILS: 9 % — ABNORMAL HIGH (ref 0–5)
HCT: 32.5 % — ABNORMAL LOW (ref 35.0–45.0)
HCT: 33.5 % — ABNORMAL LOW (ref 35.0–45.0)
HGB: 10.5 g/dL — ABNORMAL LOW (ref 12.0–16.0)
HGB: 10.9 g/dL — ABNORMAL LOW (ref 12.0–16.0)
LYMPHOCYTES: 12 % — ABNORMAL LOW (ref 20–51)
LYMPHOCYTES: 16 % — ABNORMAL LOW (ref 20–51)
MCH: 23.2 PG — ABNORMAL LOW (ref 24.0–34.0)
MCH: 23.3 PG — ABNORMAL LOW (ref 24.0–34.0)
MCHC: 32.3 g/dL (ref 31.0–37.0)
MCHC: 32.5 g/dL (ref 31.0–37.0)
MCV: 71.7 FL — ABNORMAL LOW (ref 74.0–97.0)
MCV: 71.6 FL — ABNORMAL LOW (ref 74.0–97.0)
MONOCYTES: 7 % (ref 2–9)
MONOCYTES: 3 % (ref 2–9)
MPV: 9.2 FL (ref 9.2–11.8)
MPV: 9.2 FL (ref 9.2–11.8)
NEUTROPHILS: 57 % (ref 42–75)
NEUTROPHILS: 72 % (ref 42–75)
PLATELET COMMENTS: ADEQUATE
PLATELET COMMENTS: ADEQUATE
PLATELET: 326 10*3/uL (ref 135–420)
PLATELET: 315 10*3/uL (ref 135–420)
RBC: 4.53 M/uL (ref 4.20–5.30)
RBC: 4.68 M/uL (ref 4.20–5.30)
RDW: 13.4 % (ref 11.6–14.5)
RDW: 13.7 % (ref 11.6–14.5)
WBC: 9.4 10*3/uL (ref 4.6–13.2)
WBC: 7.8 10*3/uL (ref 4.6–13.2)

## 2016-09-03 LAB — METABOLIC PANEL, BASIC
Anion gap: 8 mmol/L (ref 3.0–18)
BUN/Creatinine ratio: 10 — ABNORMAL LOW (ref 12–20)
BUN: 8 MG/DL (ref 7.0–18)
CO2: 29 mmol/L (ref 21–32)
Calcium: 8.9 MG/DL (ref 8.5–10.1)
Chloride: 103 mmol/L (ref 100–108)
Creatinine: 0.78 MG/DL (ref 0.6–1.3)
GFR est AA: >60 mL/min/{1.73_m2} (ref >60)
GFR est non-AA: >60 mL/min/{1.73_m2} (ref >60)
Glucose: 125 mg/dL — ABNORMAL HIGH (ref 74–99)
Potassium: 3.2 mmol/L — ABNORMAL LOW (ref 3.5–5.5)
Sodium: 140 mmol/L (ref 136–145)

## 2016-09-03 LAB — URINALYSIS W/ RFLX MICROSCOPIC
Bilirubin: NEGATIVE
Bilirubin: NEGATIVE
Blood: NEGATIVE
Glucose: NEGATIVE mg/dL
Glucose: NEGATIVE mg/dL
Ketone: 15 mg/dL — AB
Ketone: 15 mg/dL — AB
Leukocyte Esterase: NEGATIVE
Leukocyte Esterase: NEGATIVE
Nitrites: NEGATIVE
Nitrites: NEGATIVE
Protein: NEGATIVE mg/dL
Protein: NEGATIVE mg/dL
Specific gravity: 1.009 (ref 1.005–1.030)
Specific gravity: 1.01 (ref 1.005–1.030)
Urobilinogen: 0.2 EU/dL (ref 0.2–1.0)
Urobilinogen: 0.2 EU/dL (ref 0.2–1.0)
pH (UA): 7 (ref 5.0–8.0)
pH (UA): 7.5 (ref 5.0–8.0)

## 2016-09-03 LAB — METABOLIC PANEL, COMPREHENSIVE
A-G Ratio: 0.7 — ABNORMAL LOW (ref 0.8–1.7)
ALT (SGPT): 17 U/L (ref 13–56)
AST (SGOT): 16 U/L (ref 15–37)
Albumin: 3.3 g/dL — ABNORMAL LOW (ref 3.4–5.0)
Alk. phosphatase: 87 U/L (ref 45–117)
Anion gap: 8 mmol/L (ref 3.0–18)
BUN/Creatinine ratio: 21 — ABNORMAL HIGH (ref 12–20)
BUN: 18 MG/DL (ref 7.0–18)
Bilirubin, total: 0.4 MG/DL (ref 0.2–1.0)
CO2: 27 mmol/L (ref 21–32)
Calcium: 9.1 MG/DL (ref 8.5–10.1)
Chloride: 104 mmol/L (ref 100–108)
Creatinine: 0.84 MG/DL (ref 0.6–1.3)
GFR est AA: >60 mL/min/{1.73_m2} (ref >60)
GFR est non-AA: >60 mL/min/{1.73_m2} (ref >60)
Globulin: 4.9 g/dL — ABNORMAL HIGH (ref 2.0–4.0)
Glucose: 165 mg/dL — ABNORMAL HIGH (ref 74–99)
Potassium: 3.9 mmol/L (ref 3.5–5.5)
Protein, total: 8.2 g/dL (ref 6.4–8.2)
Sodium: 139 mmol/L (ref 136–145)

## 2016-09-03 LAB — URINE MICROSCOPIC ONLY
Bacteria: NEGATIVE /hpf
RBC: 1 /hpf (ref 0–5)
WBC: 0 /hpf (ref 0–4)

## 2016-09-03 LAB — POC LACTIC ACID: Lactic Acid (POC): 0.9 mmol/L (ref 0.4–2.0)

## 2016-09-03 MED ORDER — QUETIAPINE 25 MG TAB
25 mg | ORAL_TABLET | Freq: Two times a day (BID) | ORAL | 0 refills | Status: AC
Start: 2016-09-03 — End: ?

## 2016-09-03 MED ORDER — SODIUM CHLORIDE 0.9% BOLUS IV
0.9 % | Freq: Once | INTRAVENOUS | Status: AC
Start: 2016-09-03 — End: 2016-09-03
  Administered 2016-09-03: 17:00:00 via INTRAVENOUS

## 2016-09-03 MED FILL — SODIUM CHLORIDE 0.9 % IV: INTRAVENOUS | Qty: 1000

## 2016-09-03 MED FILL — SODIUM CHLORIDE 0.9 % IV: INTRAVENOUS | Qty: 500

## 2016-09-03 NOTE — ED Notes (Signed)
Discharge instructions discussed with patient. Patient verbalizes understanding

## 2016-09-03 NOTE — ED Notes (Signed)
[facility] here to transport pt. Back to [facility] facility. Belongings also sent with pt.

## 2016-09-03 NOTE — ED Notes (Signed)
The [facility] spoke with the clients family member in regards to their follow up care and overall health. The L[facility] was informed that the client has a follow up appointment scheduled for Monday, Sep 09, 2016 with a doctors office located in the [place] [facility] [place]. The client receives primary care through the [facility].

## 2016-09-03 NOTE — ED Notes (Cosign Needed Addendum)
Left message both with daughter and at patient's nursing home in regards to positive results. Pt has gram + Cocci in clusters. Will continue to attempt to reach patient    9:51 AM: Spoke with Daughter, Olive Zmuda who agrees to attempt to reach nursing home as well. However, she is at work. Spoke with Dr. Janey Greaser, blood cultures are positive in one bottle. Recommends either repeating blood cultures in nursing home due to possibility of contaminate.     10:28 AM: Spoke with Rosey Bath, patient's RN at nursing home who states they are unable to draw blood cultures there without an order and will send patient back to ED for further evaluation.

## 2016-09-03 NOTE — ED Triage Notes (Signed)
Sent back for eval for continued ams.

## 2016-09-03 NOTE — ED Provider Notes (Signed)
EMERGENCY DEPARTMENT HISTORY AND PHYSICAL EXAM    11:49 AM      Date: 09/03/2016  Patient Name: [name] Juarez    History of Presenting Illness     Chief Complaint   Patient presents with   ??? Altered mental status         History Provided By: EMS    Chief Complaint: Altered mental status, abnormal labs  Duration:  Days  Timing:  Acute  Location: n/a  Quality: n/a  Severity: N/A  Modifying Factors: none  Associated Symptoms: denies any other associated signs or symptoms      Additional History (Context): [name] Juarez is a 83 y.o. female with diabetes and hypertension who presents from nursing for for altered mental status and abnormal labs. Per EMS pt was sent back to the ED by nursing home medical staff because they felt the pt's mental status is still altered and one of her labs were abnormal. Pt was seen here over the last two days and was admitted yesterday for 12 hours of observation for a positive blood culture and was released on ABX.   HPI is limited due to Dementia.     PCP: Phys Other, MD    Current Outpatient Prescriptions   Medication Sig Dispense Refill   ??? QUEtiapine (SEROQUEL) 25 mg tablet Take 1 Tab by mouth two (2) times a day. 60 Tab 0   ??? amLODIPine (NORVASC) 10 mg tablet Take 10 mg by mouth daily. Indications: hypertension     ??? hydrALAZINE (APRESOLINE) 10 mg tablet Take 10 mg by mouth two (2) times a day.     ??? docusate sodium (COLACE) 100 mg capsule Take 100 mg by mouth daily. Indications: constipation     ??? raNITIdine (ZANTAC) 300 mg tablet Take 300 mg by mouth nightly.     ??? ferrous sulfate (IRON) 325 mg (65 mg iron) EC tablet Take 325 mg by mouth three (3) times daily (with meals).     ??? traMADol (ULTRAM) 50 mg tablet Take 50 mg by mouth every six (6) hours as needed for Pain.         Past History     Past Medical History:  Past Medical History:   Diagnosis Date   ??? Arthritis    ??? Diabetes ([place])    ??? Hypertension        Past Surgical History:  Past Surgical History:    Procedure Laterality Date   ??? HX GYN      hysterectomy   ??? HX HIP REPLACEMENT         Family History:  No family history on file.    Social History:  Social History   Substance Use Topics   ??? Smoking status: Never Smoker   ??? Smokeless tobacco: Never Used   ??? Alcohol use No       Allergies:  No Known Allergies      Review of Systems       Review of Systems   Constitutional: Negative.  Negative for chills, diaphoresis and fever.   HENT: Negative.  Negative for congestion, rhinorrhea and sore throat.    Eyes: Negative.  Negative for pain, discharge and redness.   Respiratory: Negative.  Negative for cough, chest tightness, shortness of breath and wheezing.    Cardiovascular: Negative.  Negative for chest pain.   Gastrointestinal: Negative.  Negative for abdominal pain, constipation, diarrhea, nausea and vomiting.   Genitourinary: Negative.  Negative for dysuria, flank pain, frequency, hematuria and  urgency.   Musculoskeletal: Negative.  Negative for back pain and neck pain.   Skin: Negative.  Negative for rash.   Neurological: Negative.  Negative for syncope, weakness, numbness and headaches.   Psychiatric/Behavioral: Negative.    All other systems reviewed and are negative.        Physical Exam     Visit Vitals   ??? BP 147/72 (BP 1 Location: Left arm)   ??? Pulse (!) 115   ??? Resp 14   ??? SpO2 100%         Physical Exam   Constitutional: She appears well-developed and well-nourished.  Non-toxic appearance. She does not have a sickly appearance. She does not appear ill. No distress.   Frail but answering questions appropriately    HENT:   Head: Normocephalic and atraumatic.   Mouth/Throat: Oropharynx is clear and moist. No oropharyngeal exudate.   Eyes: Conjunctivae and EOM are normal. Pupils are equal, round, and reactive to light. No scleral icterus.   Neck: Trachea normal and normal range of motion. Neck supple. No hepatojugular reflux and no JVD present. No tracheal deviation present. No thyromegaly present.    Cardiovascular: Normal rate, regular rhythm, S1 normal, S2 normal, normal heart sounds, intact distal pulses and normal pulses.  Exam reveals no gallop, no S3 and no S4.    No murmur heard.  Pulses:       Radial pulses are 2+ on the right side, and 2+ on the left side.        Dorsalis pedis pulses are 2+ on the right side, and 2+ on the left side.   Pulmonary/Chest: Effort normal and breath sounds normal. No accessory muscle usage. No tachypnea. No respiratory distress. She has no decreased breath sounds. She has no wheezes. She has no rhonchi. She has no rales.   Abdominal: Soft. Normal appearance and bowel sounds are normal. She exhibits no distension and no mass. There is no hepatosplenomegaly. There is no tenderness. There is no rigidity, no rebound, no guarding, no CVA tenderness, no tenderness at McBurney's point and negative Murphy's sign.   Musculoskeletal: Normal range of motion.   Strength 4/5 throughout    Lymphadenopathy:        Head (right side): No submental, no submandibular, no preauricular and no occipital adenopathy present.        Head (left side): No submental, no submandibular, no preauricular and no occipital adenopathy present.     She has no cervical adenopathy.        Right: No supraclavicular adenopathy present.        Left: No supraclavicular adenopathy present.   Neurological: She is alert. She has normal strength and normal reflexes. She is not disoriented. No cranial nerve deficit or sensory deficit. Coordination and gait normal. GCS eye subscore is 4. GCS verbal subscore is 5. GCS motor subscore is 6.   Grossly intact   Answering questions appropriately    Skin: Skin is warm, dry and intact. No rash noted. She is not diaphoretic.   Psychiatric: She has a normal mood and affect. Her speech is normal and behavior is normal. Judgment and thought content normal. Cognition and memory are normal.   Nursing note and vitals reviewed.        Diagnostic Study Results     Labs -   Recent Results (from the past 12 hour(s))   CBC WITH AUTOMATED DIFF    Collection Time: 09/03/16 12:05 PM   Result Value Ref Range  WBC 7.8 4.6 - 13.2 K/uL    RBC 4.68 4.20 - 5.30 M/uL    HGB 10.9 (L) 12.0 - 16.0 g/dL    HCT 33.5 (L) 35.0 - 45.0 %    MCV 71.6 (L) 74.0 - 97.0 FL    MCH 23.3 (L) 24.0 - 34.0 PG    MCHC 32.5 31.0 - 37.0 g/dL    RDW 13.7 11.6 - 14.5 %    PLATELET 315 135 - 420 K/uL    MPV 9.2 9.2 - 11.8 FL    NEUTROPHILS 72 42 - 75 %    LYMPHOCYTES 16 (L) 20 - 51 %    MONOCYTES 3 2 - 9 %    EOSINOPHILS 9 (H) 0 - 5 %    BASOPHILS 0 0 - 3 %    ABS. NEUTROPHILS 5.7 1.8 - 8.0 K/UL    ABS. LYMPHOCYTES 1.2 0.8 - 3.5 K/UL    ABS. MONOCYTES 0.2 0 - 1.0 K/UL    ABS. EOSINOPHILS 0.7 (H) 0.0 - 0.4 K/UL    ABS. BASOPHILS 0.0 0.0 - 0.06 K/UL    DF MANUAL      PLATELET COMMENTS ADEQUATE PLATELETS      RBC COMMENTS HYPOCHROMIA  1+       METABOLIC PANEL, COMPREHENSIVE    Collection Time: 09/03/16 12:05 PM   Result Value Ref Range    Sodium 139 136 - 145 mmol/L    Potassium 3.9 3.5 - 5.5 mmol/L    Chloride 104 100 - 108 mmol/L    CO2 27 21 - 32 mmol/L    Anion gap 8 3.0 - 18 mmol/L    Glucose 165 (H) 74 - 99 mg/dL    BUN 18 7.0 - 18 MG/DL    Creatinine 0.84 0.6 - 1.3 MG/DL    BUN/Creatinine ratio 21 (H) 12 - 20      GFR est AA >60 >60 ml/min/1.49m    GFR est non-AA >60 >60 ml/min/1.776m   Calcium 9.1 8.5 - 10.1 MG/DL    Bilirubin, total 0.4 0.2 - 1.0 MG/DL    ALT (SGPT) 17 13 - 56 U/L    AST (SGOT) 16 15 - 37 U/L    Alk. phosphatase 87 45 - 117 U/L    Protein, total 8.2 6.4 - 8.2 g/dL    Albumin 3.3 (L) 3.4 - 5.0 g/dL    Globulin 4.9 (H) 2.0 - 4.0 g/dL    A-G Ratio 0.7 (L) 0.8 - 1.7     POC LACTIC ACID    Collection Time: 09/03/16 12:08 PM   Result Value Ref Range    Lactic Acid (POC) 0.9 0.4 - 2.0 mmol/L   URINALYSIS W/ RFLX MICROSCOPIC    Collection Time: 09/03/16 12:10 PM   Result Value Ref Range    Color YELLOW      Appearance CLEAR      Specific gravity 1.010 1.005 - 1.030      pH (UA) 7.0 5.0 - 8.0       Protein NEGATIVE  NEG mg/dL    Glucose NEGATIVE  NEG mg/dL    Ketone 15 (A) NEG mg/dL    Bilirubin NEGATIVE  NEG      Blood NEGATIVE  NEG      Urobilinogen 0.2 0.2 - 1.0 EU/dL    Nitrites NEGATIVE  NEG      Leukocyte Esterase NEGATIVE  NEG         Radiologic Studies -   No  orders to display         Medical Decision Making   I am the first provider for this patient.    I reviewed the vital signs, available nursing notes, past medical history, past surgical history, family history and social history.    Vital Signs-Reviewed the patient's vital signs.    Records Reviewed: Nursing Notes and Old Medical Records (Time of Review: 11:49 AM)    ED Course: Progress Notes, Reevaluation, and Consults:    Labs essentially normal, UA negative.   12:45 PM 09/03/2016     Consult:  Discussed care with Dr Cresenciano Lick, Specialty: Family Medicine Standard discussion; including history of patient???s chief complaint, available diagnostic results, and treatment course. Agrees with plan to discharge to nursing home and he will follow up with blood cultures.   1:51 PM, 09/03/2016       Provider Notes (Medical Decision Making):  MDM  Number of Diagnoses or Management Options  Cognitive and behavioral changes:   Diagnosis management comments: Infection  Neoplasm   ACS          Diagnosis       I have reassessed the patient. Patient was discharged in stable condition.  Patient is to return to emergency department if any new or worsening condition.      Clinical Impression:   1. Cognitive and behavioral changes        Disposition: Home    Follow-up Information     Follow up With Details Comments Contact Info    Pete Glatter, MD Go in 2 days For follow up [address]   [facility]  [place] [address]  [phone]      [facility] EMERGENCY DEPT Go to As needed, If symptoms worsen [place]  [phone]           _______________________________    Attestations:  Scribe [place] acting as a Education administrator for and in the presence of Darrold Span, DO      Sep 03, 2016 at 11:49 AM       Provider Attestation:      I personally performed the services described in the documentation, reviewed the documentation, as recorded by the scribe in my presence, and it accurately and completely records my words and actions. Sep 03, 2016 at 11:49 AM - Liberty Handy Euline Kimbler, DO    _______________________________

## 2016-09-03 NOTE — ED Notes (Signed)
I have reviewed discharge instructions with the patient.  The patient verbalized understanding. Patient armband removed and shredded

## 2016-09-04 LAB — CULTURE, BLOOD

## 2016-09-07 LAB — CULTURE, BLOOD: Culture result:: NO GROWTH

## 2016-09-09 LAB — CULTURE, BLOOD
Culture result:: NO GROWTH
Culture result:: NO GROWTH

## 2016-11-21 ENCOUNTER — Emergency Department: Admit: 2016-11-22 | Payer: MEDICARE | Primary: Internal Medicine

## 2016-11-21 DIAGNOSIS — S098XXA Other specified injuries of head, initial encounter: Secondary | ICD-10-CM

## 2016-11-21 NOTE — ED Triage Notes (Signed)
Pt arrived by EMS [facility] for c/o witnessed fall from wheelchair.  Per EMS, Pt hit her head when falling, did not lose consciousness, pt is at baseline per EMS at [facility].

## 2016-11-22 ENCOUNTER — Inpatient Hospital Stay
Admit: 2016-11-22 | Discharge: 2016-11-22 | Disposition: A | Discharge status: Skilled Nursing Facility | Payer: MEDICARE | Attending: Emergency Medicine

## 2016-11-22 LAB — EKG, 12 LEAD, INITIAL
Atrial Rate: 90 {beats}/min
Calculated P Axis: 56 degrees
Calculated R Axis: -15 degrees
Calculated T Axis: 92 degrees
Diagnosis: NORMAL
P-R Interval: 122 ms
Q-T Interval: 380 ms
QRS Duration: 114 ms
QTC Calculation (Bezet): 464 ms
Ventricular Rate: 90 {beats}/min

## 2016-11-22 LAB — EKG 12-LEAD
Atrial Rate: 90 {beats}/min
Diagnosis: NORMAL
P Axis: 56 degrees
P-R Interval: 122 ms
Q-T Interval: 380 ms
QRS Duration: 114 ms
QTc Calculation (Bazett): 464 ms
R Axis: -15 degrees
T Axis: 92 degrees
Ventricular Rate: 90 {beats}/min

## 2016-11-22 NOTE — ED Notes (Signed)
Pt left by [facility].

## 2016-11-22 NOTE — ED Provider Notes (Signed)
EMERGENCY DEPARTMENT HISTORY AND PHYSICAL EXAM    12:28 AM      Date: 11/21/2016  Patient Name: [name] Juarez    History of Presenting Illness     Chief Complaint   Patient presents with   ??? Fall   ??? Head Injury         History Provided By:NH     Additional History (Context): 12:33 AM [name] Juarez is a 83 y.o. female with h/o diabetes, HTN, and a hysterectomy who presents to ED via EMS [facility] after the pt fell out of her wheelchair. Patient hit her head when she fell and she did not LOC. [facility] and they could not add any additional information. History is limited because the patient is a poor history. No modifying or aggravating factors were reported. No other concerns or symptoms at this time.    PCP: Sharyon MedicusNabil T Tadros, MD    Chief Complaint: Fall  Duration:  Minutes PTA  Timing:  Acute  Location: n/a  Quality: n/a  Severity: Moderate  Modifying Factors: No modifying or aggravating factors were reported.  Associated Symptoms: denies any other associated signs or symptoms      Current Outpatient Prescriptions   Medication Sig Dispense Refill   ??? QUEtiapine (SEROQUEL) 25 mg tablet Take 1 Tab by mouth two (2) times a day. 60 Tab 0   ??? amLODIPine (NORVASC) 10 mg tablet Take 10 mg by mouth daily. Indications: hypertension     ??? hydrALAZINE (APRESOLINE) 10 mg tablet Take 10 mg by mouth two (2) times a day.     ??? docusate sodium (COLACE) 100 mg capsule Take 100 mg by mouth daily. Indications: constipation     ??? raNITIdine (ZANTAC) 300 mg tablet Take 300 mg by mouth nightly.     ??? ferrous sulfate (IRON) 325 mg (65 mg iron) EC tablet Take 325 mg by mouth three (3) times daily (with meals).     ??? traMADol (ULTRAM) 50 mg tablet Take 50 mg by mouth every six (6) hours as needed for Pain.         Past History     Past Medical History:  Past Medical History:   Diagnosis Date   ??? Arthritis    ??? Diabetes ([facility])    ??? Hypertension        Past Surgical History:   Past Surgical History:   Procedure Laterality Date   ??? HX GYN      hysterectomy   ??? HX HIP REPLACEMENT         Family History:  No family history on file.    Social History:  Social History   Substance Use Topics   ??? Smoking status: Never Smoker   ??? Smokeless tobacco: Never Used   ??? Alcohol use No       Allergies:  No Known Allergies      Review of Systems     Review of Systems   Constitutional: Negative for fever.   Gastrointestinal: Negative for abdominal pain.   Musculoskeletal: Negative for back pain and neck pain.   All other systems reviewed and are negative.    Physical Exam     Visit Vitals   ??? BP 159/57   ??? Pulse 86   ??? Temp 97.9 ??F (36.6 ??C)   ??? Resp 16   ??? SpO2 99%       Physical Exam   Constitutional: She appears well-developed.   HENT:  Head: Normocephalic and atraumatic.   Eyes: EOM are normal. Pupils are equal, round, and reactive to light.   Neck: Normal range of motion. Neck supple.   Cardiovascular: Normal rate, regular rhythm and normal heart sounds.  Exam reveals no friction rub.    No murmur heard.  Pulmonary/Chest: Effort normal and breath sounds normal. No respiratory distress. She has no wheezes.   Abdominal: Soft. She exhibits no distension. There is no tenderness. There is no rebound and no guarding.   Musculoskeletal: Normal range of motion.   Neurological: She is alert.   aox 1     Skin: Skin is warm and dry.   Psychiatric: She has a normal mood and affect. Her behavior is normal. Thought content normal.         Diagnostic Study Results      EKG shows sinus bradycardia with a normal axis and normal intervals there is no ST elevation or depression hypertrophy    Ct head;     Medical Decision Making     1. Fall;     Prelim report: no acute findings ; will d/c    Diagnosis     No diagnosis found.    _______________________________    Attestations:  Scribe Attestation     Public house manager acting as a Neurosurgeon for and in the presence of Algis Downs, MD      November 22, 2016 at 12:28 AM        Provider Attestation:      I personally performed the services described in the documentation, reviewed the documentation, as recorded by the scribe in my presence, and it accurately and completely records my words and actions. November 22, 2016 at 12:28 AM - Algis Downs, MD    _______________________________
